# Patient Record
Sex: Female | Born: 1990 | Hispanic: No | Marital: Married | Smoking: Never smoker
Health system: Southern US, Community
[De-identification: ages and names within clinical notes are randomized; demographics above are authoritative.]

## PROBLEM LIST (undated history)

## (undated) DIAGNOSIS — Z789 Other specified health status: Secondary | ICD-10-CM

## (undated) DIAGNOSIS — O139 Gestational [pregnancy-induced] hypertension without significant proteinuria, unspecified trimester: Secondary | ICD-10-CM

## (undated) DIAGNOSIS — O09299 Supervision of pregnancy with other poor reproductive or obstetric history, unspecified trimester: Secondary | ICD-10-CM

## (undated) DIAGNOSIS — O1490 Unspecified pre-eclampsia, unspecified trimester: Secondary | ICD-10-CM

## (undated) HISTORY — PX: APPENDECTOMY: SHX54

## (undated) HISTORY — DX: Unspecified pre-eclampsia, unspecified trimester: O14.90

## (undated) HISTORY — PX: OTHER SURGICAL HISTORY: SHX169

---
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
## 2016-06-07 DIAGNOSIS — O139 Gestational [pregnancy-induced] hypertension without significant proteinuria, unspecified trimester: Secondary | ICD-10-CM

## 2016-11-05 NOTE — L&D Delivery Note (Signed)
Patient is 26 y.o. [id number] [redacted]w[redacted]d admitted for SROM. Labor augmented with cytotec and pitocin.    Delivery Note At 3:58 AM a viable female was delivered via Vaginal, Spontaneous Delivery (Presentation: ROA).  APGAR: 9, 9; weight pending.   Placenta status: spontaneous, intact.  Cord: 3 vessel   Anesthesia:  Epidural  Episiotomy: None Lacerations: 1st degree Suture Repair: 3.0 vicryl Est. Blood Loss (mL):  100  Mom to postpartum.  Baby to Couplet care / Skin to Skin.    Upon arrival patient was complete and pushing. She pushed with good maternal effort to deliver a viable female infant in cephalic, ROA position. Loose nuchal cord x1,easily reduced. Baby delivered without difficulty ( anterior shoulder delivered with ease), was noted to have good tone and placed on maternal abdomen for oral suctioning, drying and stimulation. Delayed cord clamping performed. Placenta delivered spontaneously with gentle cord traction. Fundus firm with massage and Pitocin. Perineum inspected and found to have first degree vaginal laceration, which was repaired with 3-0 vicryl with good hemostasis achieved. Counts of sharps, instruments, and lap pads were all correct.  Larene Beach, DO PGY-2 Family Medicine Resident 07/20/2017, 4:25 AM  Patient is a [id number] at [redacted]w[redacted]d who was admitted with SROM and induced with cytotec and Pitocin, significant hx of previous neonatal demise.    I was gloved and present for delivery in its entirety.  Second stage of labor progressed to SVD.  No decels during second stage noted.  Complications: none  Lacerations: 1st degree perineal/repair of infibulation  EBL: 100cc  [name], CNM 4:40 AM 07/20/2017

## 2017-02-05 ENCOUNTER — Other Ambulatory Visit (HOSPITAL_COMMUNITY)
Admission: RE | Admit: 2017-02-05 | Discharge: 2017-02-05 | Disposition: A | Discharge status: Home / Self Care | Payer: Medicaid Other | Source: Ambulatory Visit | Attending: Certified Nurse Midwife | Admitting: Certified Nurse Midwife

## 2017-02-05 ENCOUNTER — Encounter: Payer: Self-pay | Admitting: Certified Nurse Midwife

## 2017-02-05 ENCOUNTER — Ambulatory Visit (INDEPENDENT_AMBULATORY_CARE_PROVIDER_SITE_OTHER): Payer: Medicaid Other | Admitting: Certified Nurse Midwife

## 2017-02-05 VITALS — BP 118/82 | HR 106 | Wt 159.0 lb

## 2017-02-05 DIAGNOSIS — Z3402 Encounter for supervision of normal first pregnancy, second trimester: Secondary | ICD-10-CM | POA: Insufficient documentation

## 2017-02-05 DIAGNOSIS — O09291 Supervision of pregnancy with other poor reproductive or obstetric history, first trimester: Secondary | ICD-10-CM

## 2017-02-05 DIAGNOSIS — N90810 Female genital mutilation status, unspecified: Secondary | ICD-10-CM | POA: Diagnosis not present

## 2017-02-05 DIAGNOSIS — L900 Lichen sclerosus et atrophicus: Secondary | ICD-10-CM | POA: Diagnosis not present

## 2017-02-05 DIAGNOSIS — Z789 Other specified health status: Secondary | ICD-10-CM

## 2017-02-05 DIAGNOSIS — O0990 Supervision of high risk pregnancy, unspecified, unspecified trimester: Secondary | ICD-10-CM | POA: Insufficient documentation

## 2017-02-05 DIAGNOSIS — Z3400 Encounter for supervision of normal first pregnancy, unspecified trimester: Secondary | ICD-10-CM

## 2017-02-05 DIAGNOSIS — O09299 Supervision of pregnancy with other poor reproductive or obstetric history, unspecified trimester: Secondary | ICD-10-CM

## 2017-02-05 MED ORDER — FOLIC ACID 1 MG PO TABS: 1.0000 mg | ORAL_TABLET | Freq: Every day | ORAL | 5 refills | Status: DC

## 2017-02-05 MED ORDER — PROVIDA DHA 16-16-1.25-110 MG PO CAPS: 1.0000 | ORAL_CAPSULE | Freq: Every day | ORAL | 12 refills | Status: DC

## 2017-02-05 MED ORDER — CLOBETASOL PROPIONATE 0.05 % EX OINT: 1.0000 "application " | TOPICAL_OINTMENT | Freq: Two times a day (BID) | CUTANEOUS | 0 refills | Status: DC

## 2017-02-05 NOTE — Progress Notes (Signed)
Patient has some discharge- redness, painful, itching.

## 2017-02-06 ENCOUNTER — Other Ambulatory Visit: Payer: Self-pay | Admitting: Certified Nurse Midwife

## 2017-02-06 ENCOUNTER — Encounter: Payer: Self-pay | Admitting: Certified Nurse Midwife

## 2017-02-06 DIAGNOSIS — O09299 Supervision of pregnancy with other poor reproductive or obstetric history, unspecified trimester: Secondary | ICD-10-CM | POA: Insufficient documentation

## 2017-02-06 DIAGNOSIS — L900 Lichen sclerosus et atrophicus: Secondary | ICD-10-CM

## 2017-02-06 DIAGNOSIS — Z3480 Encounter for supervision of other normal pregnancy, unspecified trimester: Secondary | ICD-10-CM

## 2017-02-06 DIAGNOSIS — N90810 Female genital mutilation status, unspecified: Secondary | ICD-10-CM | POA: Insufficient documentation

## 2017-02-06 DIAGNOSIS — Z789 Other specified health status: Secondary | ICD-10-CM | POA: Insufficient documentation

## 2017-02-06 HISTORY — DX: Supervision of pregnancy with other poor reproductive or obstetric history, unspecified trimester: O09.299

## 2017-02-06 HISTORY — DX: Lichen sclerosus et atrophicus: L90.0

## 2017-02-06 LAB — OBSTETRIC PANEL, INCLUDING HIV
Antibody Screen: NEGATIVE
BASOS ABS: 0 10*3/uL (ref 0.0–0.2)
Basos: 0 %
EOS (ABSOLUTE): 0.2 10*3/uL (ref 0.0–0.4)
Eos: 2 %
HIV SCREEN 4TH GENERATION: NONREACTIVE
Hematocrit: 35.6 % (ref 34.0–46.6)
Hemoglobin: 12.5 g/dL (ref 11.1–15.9)
Hepatitis B Surface Ag: NEGATIVE
Immature Grans (Abs): 0 10*3/uL (ref 0.0–0.1)
Immature Granulocytes: 0 %
Lymphocytes Absolute: 1.9 10*3/uL (ref 0.7–3.1)
Lymphs: 19 %
MCH: 30 pg (ref 26.6–33.0)
MCHC: 35.1 g/dL (ref 31.5–35.7)
MCV: 86 fL (ref 79–97)
MONOS ABS: 0.6 10*3/uL (ref 0.1–0.9)
Monocytes: 5 %
NEUTROS ABS: 7.8 10*3/uL — AB (ref 1.4–7.0)
Neutrophils: 74 %
PLATELETS: 290 10*3/uL (ref 150–379)
RBC: 4.16 x10E6/uL (ref 3.77–5.28)
RDW: 13.5 % (ref 12.3–15.4)
RH TYPE: POSITIVE
RPR Ser Ql: NONREACTIVE
Rubella Antibodies, IGG: 16.3 index (ref >0.99)
WBC: 10.5 10*3/uL (ref 3.4–10.8)

## 2017-02-06 LAB — HEMOGLOBINOPATHY EVALUATION
HEMOGLOBIN A2 QUANTITATION: 3 % (ref 1.8–3.2)
HGB C: 0 %
HGB S: 0 %
HGB VARIANT: 0 %
Hemoglobin F Quantitation: 0 % (ref 0.0–2.0)
Hgb A: 97 % (ref 96.4–98.8)

## 2017-02-06 LAB — VARICELLA ZOSTER ANTIBODY, IGG: Varicella zoster IgG: <135 index — ABNORMAL LOW (ref >165)

## 2017-02-06 LAB — HEMOGLOBIN A1C
Est. average glucose Bld gHb Est-mCnc: 97 mg/dL
Hgb A1c MFr Bld: 5 % (ref 4.8–5.6)

## 2017-02-06 LAB — CERVICOVAGINAL ANCILLARY ONLY
BACTERIAL VAGINITIS: NEGATIVE
Candida vaginitis: POSITIVE — AB
Chlamydia: NEGATIVE
NEISSERIA GONORRHEA: NEGATIVE
Trichomonas: NEGATIVE

## 2017-02-06 LAB — TSH: TSH: 0.053 u[IU]/mL — AB (ref 0.450–4.500)

## 2017-02-06 MED ORDER — PROVIDA OB 20-20-1.25 MG PO CAPS: 1.0000 | ORAL_CAPSULE | Freq: Every day | ORAL | 5 refills | Status: DC

## 2017-02-06 NOTE — Progress Notes (Signed)
Subjective:    Jacqueline Irwin is being seen today for her first obstetrical visit.  This is a planned pregnancy. She is at [redacted]w[redacted]d gestation. Her obstetrical history is significant for pregnancy induced hypertension and delivered in [place], hx of miscarriage at 8 weeks in [place].  First child deilivered around 8 months and died after delivery.  . Relationship with FOB: spouse, living together. Patient does intend to breast feed. Pregnancy history fully reviewed.  The information documented in the HPI was reviewed and verified.  Menstrual History: OB History    Gravida Para Term Preterm AB Living   SAB TAB Ectopic Multiple Live Births   1               Patient's last menstrual period was 10/24/2016.    History reviewed. No pertinent past medical history.  Past Surgical History:  Procedure Laterality Date  . abcess of vulva    . APPENDECTOMY       (Not in a hospital admission) No Known Allergies  Social History  Substance Use Topics  . Smoking status: Never Smoker  . Smokeless tobacco: Never Used  . Alcohol use No    Family History  Problem Relation Age of Onset  . Diabetes Mother   . Diabetes Paternal Grandmother   . Hypertension Paternal Grandmother      Review of Systems Constitutional: negative for weight loss Gastrointestinal: negative for vomiting Genitourinary:negative for genital lesions and vaginal discharge and dysuria Musculoskeletal:negative for back pain Behavioral/Psych: negative for abusive relationship, depression, illegal drug usage and tobacco use    Objective:    BP 118/82   Pulse (!) 106   Wt 159 lb (72.1 kg)   LMP 10/24/2016  General Appearance:    Alert, cooperative, no distress, appears stated age  Head:    Normocephalic, without obvious abnormality, atraumatic  Eyes:    PERRL, conjunctiva/corneas clear, EOM's intact, fundi    benign, both eyes  Ears:    Normal TM's and external ear canals, both ears  Nose:   Nares normal,  septum midline, mucosa normal, no drainage    or sinus tenderness  Throat:   Lips, mucosa, and tongue normal; teeth and gums normal  Neck:   Supple, symmetrical, trachea midline, no adenopathy;    thyroid:  no enlargement/tenderness/nodules; no carotid   bruit or JVD  Back:     Symmetric, no curvature, ROM normal, no CVA tenderness  Lungs:     Clear to auscultation bilaterally, respirations unlabored  Chest Wall:    No tenderness or deformity   Heart:    Regular rate and rhythm, S1 and S2 normal, no murmur, rub   or gallop  Breast Exam:    No tenderness, masses, or nipple abnormality  Abdomen:     Soft, non-tender, bowel sounds active all four quadrants,    no masses, no organomegaly  Genitalia:    Normal female without lesion, discharge or tenderness  Extremities:   Extremities normal, atraumatic, no cyanosis or edema  Pulses:   2+ and symmetric all extremities  Skin:   Skin color, texture, turgor normal, no rashes or lesions  Lymph nodes:   Cervical, supraclavicular, and axillary nodes normal  Neurologic:   CNII-XII intact, normal strength, sensation and reflexes    throughout         Cervix:  Friable on exam, long, thick, closed and posterior.  FHR: 158 by doppler.     Size  c/w dates.      Lab Review Urine pregnancy test Labs reviewed yes Radiologic studies reviewed no Assessment:    Pregnancy at [redacted]w[redacted]d weeks     Supervision of normal first pregnancy, antepartum - Plan: Cytology - PAP, Cervicovaginal ancillary only, TSH, Hemoglobinopathy evaluation, Varicella zoster antibody, IgG, Culture, OB Urine, MaterniT21 PLUS Core+SCA, Hemoglobin A1c, Obstetric Panel, Including HIV, Cystic Fibrosis Mutation 97, folic acid (FOLVITE) 1 MG tablet, Prenat-FeFum-FePo-FA-DHA w/o A (PROVIDA DHA) 16-16-1.25-110 MG CAPS, [place] MFM OB COMP + 14 WK  Lichen sclerosus - Plan: clobetasol ointment (TEMOVATE) 0.05 %  Non-English speaking patient  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  Counseling provided regarding continued use of seat belts, cessation of alcohol consumption, smoking or use of illicit drugs; infection precautions i.e., influenza/TDAP immunizations, toxoplasmosis,CMV, parvovirus, listeria and varicella; workplace safety, exercise during pregnancy; routine dental care, safe medications, sexual activity, hot tubs, saunas, pools, travel, caffeine use, fish and methlymercury, potential toxins, hair treatments, varicose veins Weight gain recommendations per IOM guidelines reviewed: underweight/BMI< 18.5--> gain 28 - 40 lbs; normal weight/BMI 18.5 - 24.9--> gain 25 - 35 lbs; overweight/BMI 25 - 29.9--> gain 15 - 25 lbs; obese/BMI >30->gain  11 - 20 lbs Problem list reviewed and updated. FIRST/CF mutation testing/NIPT/QUAD SCREEN/fragile X/Ashkenazi Jewish population testing/Spinal muscular atrophy discussed: ordered. Role of ultrasound in pregnancy discussed; fetal survey: ordered. Amniocentesis discussed: not indicated. VBAC calculator score: VBAC consent form provided Meds ordered this encounter  Medications  . aspirin 75 MG chewable tablet    Sig: Chew 75 mg by mouth daily.  Marland Kitchen DISCONTD: folic acid (FOLVITE) 1 MG tablet    Sig: Take 1 mg by mouth daily.  . folic acid (FOLVITE) 1 MG tablet    Sig: Take 1 tablet (1 mg total) by mouth daily.    Dispense:  30 tablet    Refill:  5  . Prenat-FeFum-FePo-FA-DHA w/o A (PROVIDA DHA) 16-16-1.25-110 MG CAPS    Sig: Take 1 tablet by mouth daily.    Dispense:  30 capsule    Refill:  12  . clobetasol ointment (TEMOVATE) 0.05 %    Sig: Apply 1 application topically 2 (two) times daily.    Dispense:  30 g    Refill:  0   Orders Placed This Encounter  Procedures  . Culture, OB Urine  . [place] MFM OB COMP + 14 WK    Standing Status:   Future    Standing Expiration Date:   04/07/2018    Order Specific Question:   Reason for Exam (SYMPTOM  OR DIAGNOSIS  REQUIRED)    Answer:   fetal anatomy scan    Order Specific Question:   Preferred imaging location?    Answer:   [facility]  . TSH  . Hemoglobinopathy evaluation  . Varicella zoster antibody, IgG  . MaterniT21 PLUS Core+SCA    Order Specific Question:   Is the patient insulin dependent?    Answer:   No    Order Specific Question:   Please enter gestational age. This should be expressed as weeks AND days, i.e. 16w 6d. Enter weeks here. Enter days in next question.    Answer:   66    Order Specific Question:   Please enter gestational age. This should be expressed as weeks AND days, i.e. 16w 6d. Enter days here. Enter weeks in previous question.    Answer:   6  Order Specific Question:   How was gestational age calculated?    Answer:   LMP    Order Specific Question:   Please give the date of LMP OR Ultrasound OR Estimated date of delivery.    Answer:   07/31/2017    Order Specific Question:   Number of Fetuses (Type of Pregnancy):    Answer:   1    Order Specific Question:   Indications for performing the test? (please choose all that apply):    Answer:   Routine screening    Order Specific Question:   Other Indications? (Y=Yes, N=No)    Answer:   N    Order Specific Question:   If this is a repeat specimen, please indicate the reason:    Answer:   Not indicated    Order Specific Question:   Please specify the patient's race: (C=White/Caucasion, B=Black, I=Native American, A=Asian, H=Hispanic, O=Other, U=Unknown)    Answer:   O    Order Specific Question:   Donor Egg - indicate if the egg was obtained from in vitro fertilization.    Answer:   N    Order Specific Question:   Age of Egg Donor.    Answer:   74    Order Specific Question:   Prior Down Syndrome/ONTD screening during current pregnancy.    Answer:   N    Order Specific Question:   Prior First Trimester Testing    Answer:   N    Order Specific Question:   Prior Second Trimester Testing    Answer:   N    Order  Specific Question:   Family History of Neural Tube Defects    Answer:   N    Order Specific Question:   Prior Pregnancy with Down Syndrome    Answer:   N    Order Specific Question:   Please give the patient's weight (in pounds)    Answer:   159  . Hemoglobin A1c  . Obstetric Panel, Including HIV  . Cystic Fibrosis Mutation 97    Follow up in 4 weeks. 50% of 30 min visit spent on counseling and coordination of care.

## 2017-02-07 ENCOUNTER — Encounter: Payer: Self-pay | Admitting: *Deleted

## 2017-02-07 ENCOUNTER — Other Ambulatory Visit: Payer: Self-pay | Admitting: Certified Nurse Midwife

## 2017-02-07 DIAGNOSIS — O09899 Supervision of other high risk pregnancies, unspecified trimester: Secondary | ICD-10-CM | POA: Insufficient documentation

## 2017-02-07 DIAGNOSIS — Z283 Underimmunization status: Secondary | ICD-10-CM

## 2017-02-07 DIAGNOSIS — B3731 Acute candidiasis of vulva and vagina: Secondary | ICD-10-CM

## 2017-02-07 DIAGNOSIS — Z2839 Other underimmunization status: Secondary | ICD-10-CM | POA: Insufficient documentation

## 2017-02-07 DIAGNOSIS — B373 Candidiasis of vulva and vagina: Secondary | ICD-10-CM

## 2017-02-07 LAB — CYTOLOGY - PAP: Diagnosis: NEGATIVE

## 2017-02-07 LAB — CULTURE, OB URINE

## 2017-02-07 LAB — URINE CULTURE, OB REFLEX

## 2017-02-07 MED ORDER — FLUCONAZOLE 150 MG PO TABS: 150.0000 mg | ORAL_TABLET | Freq: Once | ORAL | 0 refills | Status: AC

## 2017-02-11 ENCOUNTER — Other Ambulatory Visit: Payer: Self-pay | Admitting: Certified Nurse Midwife

## 2017-02-11 DIAGNOSIS — Z3480 Encounter for supervision of other normal pregnancy, unspecified trimester: Secondary | ICD-10-CM

## 2017-02-11 LAB — MATERNIT21 PLUS CORE+SCA
Chromosome 13: NEGATIVE
Chromosome 18: NEGATIVE
Chromosome 21: NEGATIVE
Y Chromosome: DETECTED

## 2017-02-11 LAB — CYSTIC FIBROSIS MUTATION 97: GENE DIS ANAL CARRIER INTERP BLD/T-IMP: NOT DETECTED

## 2017-02-12 ENCOUNTER — Other Ambulatory Visit: Payer: Self-pay | Admitting: Certified Nurse Midwife

## 2017-02-12 DIAGNOSIS — Z3480 Encounter for supervision of other normal pregnancy, unspecified trimester: Secondary | ICD-10-CM

## 2017-02-12 DIAGNOSIS — R7989 Other specified abnormal findings of blood chemistry: Secondary | ICD-10-CM | POA: Insufficient documentation

## 2017-02-13 LAB — THYROID PANEL
Free Thyroxine Index: 2.9 (ref 1.2–4.9)
T3 UPTAKE RATIO: 19 % — AB (ref 24–39)
T4, Total: 15.5 ug/dL — ABNORMAL HIGH (ref 4.5–12.0)

## 2017-02-13 LAB — SPECIMEN STATUS REPORT

## 2017-02-18 ENCOUNTER — Encounter: Payer: Self-pay | Admitting: *Deleted

## 2017-03-05 ENCOUNTER — Encounter: Payer: Medicaid Other | Admitting: Certified Nurse Midwife

## 2017-03-05 ENCOUNTER — Ambulatory Visit (INDEPENDENT_AMBULATORY_CARE_PROVIDER_SITE_OTHER): Payer: Medicaid Other | Admitting: Certified Nurse Midwife

## 2017-03-05 ENCOUNTER — Encounter: Payer: Self-pay | Admitting: Certified Nurse Midwife

## 2017-03-05 ENCOUNTER — Other Ambulatory Visit (HOSPITAL_COMMUNITY)
Admission: RE | Admit: 2017-03-05 | Discharge: 2017-03-05 | Disposition: A | Discharge status: Home / Self Care | Payer: Medicaid Other | Source: Ambulatory Visit | Attending: Certified Nurse Midwife | Admitting: Certified Nurse Midwife

## 2017-03-05 VITALS — BP 107/76 | HR 101 | Wt 158.0 lb

## 2017-03-05 DIAGNOSIS — L900 Lichen sclerosus et atrophicus: Secondary | ICD-10-CM

## 2017-03-05 DIAGNOSIS — B373 Candidiasis of vulva and vagina: Secondary | ICD-10-CM

## 2017-03-05 DIAGNOSIS — O09299 Supervision of pregnancy with other poor reproductive or obstetric history, unspecified trimester: Secondary | ICD-10-CM

## 2017-03-05 DIAGNOSIS — Z789 Other specified health status: Secondary | ICD-10-CM

## 2017-03-05 DIAGNOSIS — O09892 Supervision of other high risk pregnancies, second trimester: Secondary | ICD-10-CM

## 2017-03-05 DIAGNOSIS — R7989 Other specified abnormal findings of blood chemistry: Secondary | ICD-10-CM

## 2017-03-05 DIAGNOSIS — O09292 Supervision of pregnancy with other poor reproductive or obstetric history, second trimester: Secondary | ICD-10-CM

## 2017-03-05 DIAGNOSIS — Z3480 Encounter for supervision of other normal pregnancy, unspecified trimester: Secondary | ICD-10-CM

## 2017-03-05 DIAGNOSIS — R946 Abnormal results of thyroid function studies: Secondary | ICD-10-CM

## 2017-03-05 DIAGNOSIS — B3731 Acute candidiasis of vulva and vagina: Secondary | ICD-10-CM

## 2017-03-05 DIAGNOSIS — Z2839 Other underimmunization status: Secondary | ICD-10-CM

## 2017-03-05 DIAGNOSIS — B9689 Other specified bacterial agents as the cause of diseases classified elsewhere: Secondary | ICD-10-CM | POA: Diagnosis not present

## 2017-03-05 DIAGNOSIS — O09899 Supervision of other high risk pregnancies, unspecified trimester: Secondary | ICD-10-CM

## 2017-03-05 DIAGNOSIS — N90810 Female genital mutilation status, unspecified: Secondary | ICD-10-CM

## 2017-03-05 DIAGNOSIS — Z283 Underimmunization status: Secondary | ICD-10-CM

## 2017-03-05 MED ORDER — ASPIRIN 81 MG PO CHEW: 81.0000 mg | CHEWABLE_TABLET | Freq: Every day | ORAL | 12 refills | Status: DC

## 2017-03-05 MED ORDER — TERCONAZOLE 0.8 % VA CREA: 1.0000 | TOPICAL_CREAM | Freq: Every day | VAGINAL | 0 refills | Status: DC

## 2017-03-05 MED ORDER — FLUCONAZOLE 150 MG PO TABS: 150.0000 mg | ORAL_TABLET | Freq: Once | ORAL | 0 refills | Status: AC

## 2017-03-05 NOTE — Progress Notes (Signed)
Phone Int [phone] - Arabic Pt states that she is having some cramping.  Pt states that she did not get Rx for yeast infection, ?resend today. Pt states that she would like to know if she may fast for Ramadan.

## 2017-03-06 LAB — CERVICOVAGINAL ANCILLARY ONLY
BACTERIAL VAGINITIS: NEGATIVE
Candida vaginitis: POSITIVE — AB

## 2017-03-07 ENCOUNTER — Ambulatory Visit (HOSPITAL_COMMUNITY)
Admission: RE | Admit: 2017-03-07 | Discharge: 2017-03-07 | Disposition: A | Discharge status: Home / Self Care | Payer: Medicaid Other | Source: Ambulatory Visit | Attending: Certified Nurse Midwife | Admitting: Certified Nurse Midwife

## 2017-03-07 ENCOUNTER — Other Ambulatory Visit: Payer: Self-pay | Admitting: Certified Nurse Midwife

## 2017-03-07 DIAGNOSIS — Z363 Encounter for antenatal screening for malformations: Secondary | ICD-10-CM | POA: Insufficient documentation

## 2017-03-07 DIAGNOSIS — Z3A19 19 weeks gestation of pregnancy: Secondary | ICD-10-CM | POA: Diagnosis not present

## 2017-03-07 DIAGNOSIS — Z3480 Encounter for supervision of other normal pregnancy, unspecified trimester: Secondary | ICD-10-CM

## 2017-03-07 DIAGNOSIS — Z8759 Personal history of other complications of pregnancy, childbirth and the puerperium: Secondary | ICD-10-CM | POA: Insufficient documentation

## 2017-03-07 DIAGNOSIS — Z3400 Encounter for supervision of normal first pregnancy, unspecified trimester: Secondary | ICD-10-CM

## 2017-04-02 ENCOUNTER — Encounter: Payer: Self-pay | Admitting: Certified Nurse Midwife

## 2017-04-02 ENCOUNTER — Ambulatory Visit (INDEPENDENT_AMBULATORY_CARE_PROVIDER_SITE_OTHER): Payer: Medicaid Other | Admitting: Certified Nurse Midwife

## 2017-04-02 VITALS — BP 110/76 | HR 100 | Ht 64.0 in

## 2017-04-02 DIAGNOSIS — Z3480 Encounter for supervision of other normal pregnancy, unspecified trimester: Secondary | ICD-10-CM

## 2017-04-02 DIAGNOSIS — O09899 Supervision of other high risk pregnancies, unspecified trimester: Secondary | ICD-10-CM

## 2017-04-02 DIAGNOSIS — R946 Abnormal results of thyroid function studies: Secondary | ICD-10-CM | POA: Diagnosis not present

## 2017-04-02 DIAGNOSIS — O09292 Supervision of pregnancy with other poor reproductive or obstetric history, second trimester: Secondary | ICD-10-CM | POA: Diagnosis not present

## 2017-04-02 DIAGNOSIS — R7989 Other specified abnormal findings of blood chemistry: Secondary | ICD-10-CM

## 2017-04-02 DIAGNOSIS — Z789 Other specified health status: Secondary | ICD-10-CM

## 2017-04-02 DIAGNOSIS — Z283 Underimmunization status: Secondary | ICD-10-CM

## 2017-04-02 DIAGNOSIS — Z2839 Other underimmunization status: Secondary | ICD-10-CM

## 2017-04-02 DIAGNOSIS — O09299 Supervision of pregnancy with other poor reproductive or obstetric history, unspecified trimester: Secondary | ICD-10-CM

## 2017-04-02 NOTE — Progress Notes (Signed)
Interpreter # [id number] Pt would like to discuss baby movements. What to expect at this gestation.

## 2017-04-02 NOTE — Progress Notes (Signed)
   PRENATAL VISIT NOTE  Subjective:  Jacqueline Irwin is a 26 y.o. G3P0110 at 6280w6d being seen today for ongoing prenatal care.  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  Patient reports no bleeding, no contractions, no cramping, no leaking and uri symptoms. URI symptoms, denies fever, reports nasal congestion, cough, no sputum production   Contractions: Not present. Vag. Bleeding: None.  Movement: Present. Denies leaking of fluid.   The following portions of the patient's history were reviewed and updated as appropriate: allergies, current medications, past family history, past medical history, past social history, past surgical history and problem list. Problem list updated.  Objective:   Vitals:   04/02/17 1028 04/02/17 1033  BP: 110/76   Pulse: 100   Height:  5\' 4"  (1.626 m)    Fetal Status: Fetal Heart Rate (bpm): 145 Fundal Height: 23 cm Movement: Present     General:  Alert, oriented and cooperative. Patient is in no acute distress.  Skin: Skin is warm and dry. No rash noted.   Cardiovascular: Normal heart rate noted  Respiratory: Normal respiratory effort, no problems with respiration noted  Abdomen: Soft, gravid, appropriate for gestational age. Pain/Pressure: Absent     Pelvic:  Cervical exam deferred        Extremities: Normal range of motion.     Mental Status: Normal mood and affect. Normal behavior. Normal judgment and thought content.   Assessment and Plan:  Pregnancy: G3P0110 at 2180w6d  1. Supervision of other normal pregnancy, antepartum        2. Non-English speaking patient     Video interpreter used  3. Hx of preeclampsia,  prior pregnancy, currently pregnant     Taking baby ASA daily  4. Maternal varicella, non-immune     Varicella postpartum  5. Abnormal thyroid stimulating hormone (TSH) level     Has endocrinology appointment pending  Preterm labor symptoms and general obstetric precautions including but not limited to vaginal bleeding, contractions, leaking of fluid and fetal movement were reviewed in detail with the patient. Please refer to After Visit Summary for other counseling recommendations.  Return in about 4 weeks (around 04/30/2017) for ROB, 2 hr OGTT.   Roe Coombsachelle A Hollee Fate, CNM

## 2017-04-09 ENCOUNTER — Other Ambulatory Visit: Payer: Self-pay | Admitting: Certified Nurse Midwife

## 2017-04-09 MED ORDER — NESTABS ONE 38-1-225 MG PO CAPS: 1.0000 | ORAL_CAPSULE | Freq: Every day | ORAL | 12 refills | Status: DC

## 2017-04-15 ENCOUNTER — Ambulatory Visit (HOSPITAL_COMMUNITY): Payer: Medicaid Other

## 2017-04-22 ENCOUNTER — Ambulatory Visit (HOSPITAL_COMMUNITY): Payer: Medicaid Other

## 2017-04-30 ENCOUNTER — Encounter: Payer: Self-pay | Admitting: Certified Nurse Midwife

## 2017-04-30 ENCOUNTER — Ambulatory Visit (INDEPENDENT_AMBULATORY_CARE_PROVIDER_SITE_OTHER): Payer: Medicaid Other | Admitting: Certified Nurse Midwife

## 2017-04-30 VITALS — BP 105/72 | HR 105 | Wt 161.8 lb

## 2017-04-30 DIAGNOSIS — Z2839 Other underimmunization status: Secondary | ICD-10-CM

## 2017-04-30 DIAGNOSIS — O09299 Supervision of pregnancy with other poor reproductive or obstetric history, unspecified trimester: Secondary | ICD-10-CM

## 2017-04-30 DIAGNOSIS — R946 Abnormal results of thyroid function studies: Secondary | ICD-10-CM | POA: Diagnosis not present

## 2017-04-30 DIAGNOSIS — O09899 Supervision of other high risk pregnancies, unspecified trimester: Secondary | ICD-10-CM

## 2017-04-30 DIAGNOSIS — Z3480 Encounter for supervision of other normal pregnancy, unspecified trimester: Secondary | ICD-10-CM

## 2017-04-30 DIAGNOSIS — Z283 Underimmunization status: Secondary | ICD-10-CM | POA: Diagnosis not present

## 2017-04-30 DIAGNOSIS — O09292 Supervision of pregnancy with other poor reproductive or obstetric history, second trimester: Secondary | ICD-10-CM

## 2017-04-30 DIAGNOSIS — O09892 Supervision of other high risk pregnancies, second trimester: Secondary | ICD-10-CM | POA: Diagnosis not present

## 2017-04-30 DIAGNOSIS — Z789 Other specified health status: Secondary | ICD-10-CM

## 2017-04-30 DIAGNOSIS — N90810 Female genital mutilation status, unspecified: Secondary | ICD-10-CM | POA: Diagnosis not present

## 2017-04-30 DIAGNOSIS — R7989 Other specified abnormal findings of blood chemistry: Secondary | ICD-10-CM

## 2017-04-30 NOTE — Progress Notes (Signed)
   PRENATAL VISIT NOTE  Subjective:  Jacqueline Irwin is a 26 y.o. G3P0110 at 41w6dbeing seen today for ongoing prenatal care.  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  Patient reports no complaints.  Contractions: Irritability. Vag. Bleeding: None.  Movement: Present. Denies leaking of fluid.   The following portions of the patient's history were reviewed and updated as appropriate: allergies, current medications, past family history, past medical history, past social history, past surgical history and problem list. Problem list updated.  Objective:   Vitals:   04/30/17 0921  BP: 105/72  Pulse: (!) 105  Weight: 161 lb 12.8 oz (73.4 kg)    Fetal Status: Fetal Heart Rate (bpm): 148 Fundal Height: 26 cm Movement: Present     General:  Alert, oriented and cooperative. Patient is in no acute distress.  Skin: Skin is warm and dry. No rash noted.   Cardiovascular: Normal heart rate noted  Respiratory: Normal respiratory effort, no problems with respiration noted  Abdomen: Soft, gravid, appropriate for gestational age. Pain/Pressure: Absent     Pelvic:  Cervical exam deferred        Extremities: Normal range of motion.  Edema: None  Mental Status: Normal mood and affect. Normal behavior. Normal judgment and thought content.   Assessment and Plan:  Pregnancy: G3P0110 at 261w6d1. Supervision of other normal pregnancy, antepartum      Pregnancy home coordinator met with patient.  Increased water encouraged along with increased snacks.  - Glucose Tolerance, 2 Hours w/1 Hour - CBC - HIV antibody - RPR  2. Non-English speaking patient     Video  interpreter used.   3. Hx of preeclampsia, prior pregnancy, currently pregnant     Taking baby ASA  4. Maternal varicella, non-immune     Varicella postpartum  5. Abnormal thyroid stimulating hormone (TSH) level     Has f/u scheduled with endocrinology 05/24/17.    6. 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   Preterm labor symptoms and general obstetric precautions including but not limited to vaginal bleeding, contractions, leaking of fluid and fetal movement were reviewed in detail with the patient. Please refer to After Visit Summary for other counseling recommendations.  Return in about 2 weeks (around 05/14/2017) for RODeaver  Jacqueline CrockerCNM

## 2017-05-01 ENCOUNTER — Other Ambulatory Visit: Payer: Self-pay | Admitting: Certified Nurse Midwife

## 2017-05-01 DIAGNOSIS — Z3480 Encounter for supervision of other normal pregnancy, unspecified trimester: Secondary | ICD-10-CM

## 2017-05-01 LAB — CBC
HEMATOCRIT: 34.8 % (ref 34.0–46.6)
HEMOGLOBIN: 12 g/dL (ref 11.1–15.9)
MCH: 29.5 pg (ref 26.6–33.0)
MCHC: 34.5 g/dL (ref 31.5–35.7)
MCV: 86 fL (ref 79–97)
Platelets: 213 10*3/uL (ref 150–379)
RBC: 4.07 x10E6/uL (ref 3.77–5.28)
RDW: 13.8 % (ref 12.3–15.4)
WBC: 10.4 10*3/uL (ref 3.4–10.8)

## 2017-05-01 LAB — GLUCOSE TOLERANCE, 2 HOURS W/ 1HR
GLUCOSE, 1 HOUR: 119 mg/dL (ref 65–179)
GLUCOSE, FASTING: 74 mg/dL (ref 65–91)
Glucose, 2 hour: 86 mg/dL (ref 65–152)

## 2017-05-01 LAB — RPR: RPR: NONREACTIVE

## 2017-05-01 LAB — HIV ANTIBODY (ROUTINE TESTING W REFLEX): HIV SCREEN 4TH GENERATION: NONREACTIVE

## 2017-05-02 ENCOUNTER — Other Ambulatory Visit: Payer: Self-pay | Admitting: Certified Nurse Midwife

## 2017-05-02 ENCOUNTER — Ambulatory Visit (HOSPITAL_COMMUNITY)
Admission: RE | Admit: 2017-05-02 | Discharge: 2017-05-02 | Disposition: A | Discharge status: Home / Self Care | Payer: Medicaid Other | Source: Ambulatory Visit | Attending: Certified Nurse Midwife | Admitting: Certified Nurse Midwife

## 2017-05-02 DIAGNOSIS — Z3480 Encounter for supervision of other normal pregnancy, unspecified trimester: Secondary | ICD-10-CM

## 2017-05-02 DIAGNOSIS — O09299 Supervision of pregnancy with other poor reproductive or obstetric history, unspecified trimester: Secondary | ICD-10-CM

## 2017-05-02 DIAGNOSIS — IMO0002 Reserved for concepts with insufficient information to code with codable children: Secondary | ICD-10-CM

## 2017-05-02 DIAGNOSIS — Z0489 Encounter for examination and observation for other specified reasons: Secondary | ICD-10-CM

## 2017-05-02 DIAGNOSIS — O09292 Supervision of pregnancy with other poor reproductive or obstetric history, second trimester: Secondary | ICD-10-CM | POA: Diagnosis present

## 2017-05-02 DIAGNOSIS — Z048 Encounter for examination and observation for other specified reasons: Secondary | ICD-10-CM | POA: Insufficient documentation

## 2017-05-02 DIAGNOSIS — Z3A27 27 weeks gestation of pregnancy: Secondary | ICD-10-CM | POA: Insufficient documentation

## 2017-05-07 ENCOUNTER — Other Ambulatory Visit: Payer: Self-pay | Admitting: Certified Nurse Midwife

## 2017-05-07 DIAGNOSIS — Z3480 Encounter for supervision of other normal pregnancy, unspecified trimester: Secondary | ICD-10-CM

## 2017-05-15 ENCOUNTER — Encounter: Payer: Self-pay | Admitting: Certified Nurse Midwife

## 2017-05-15 ENCOUNTER — Ambulatory Visit (INDEPENDENT_AMBULATORY_CARE_PROVIDER_SITE_OTHER): Payer: Medicaid Other | Admitting: Certified Nurse Midwife

## 2017-05-15 VITALS — BP 103/72 | HR 108 | Wt 161.8 lb

## 2017-05-15 DIAGNOSIS — K219 Gastro-esophageal reflux disease without esophagitis: Secondary | ICD-10-CM

## 2017-05-15 DIAGNOSIS — O99613 Diseases of the digestive system complicating pregnancy, third trimester: Secondary | ICD-10-CM

## 2017-05-15 DIAGNOSIS — Z2839 Other underimmunization status: Secondary | ICD-10-CM

## 2017-05-15 DIAGNOSIS — O09293 Supervision of pregnancy with other poor reproductive or obstetric history, third trimester: Secondary | ICD-10-CM

## 2017-05-15 DIAGNOSIS — O09299 Supervision of pregnancy with other poor reproductive or obstetric history, unspecified trimester: Secondary | ICD-10-CM

## 2017-05-15 DIAGNOSIS — O09899 Supervision of other high risk pregnancies, unspecified trimester: Secondary | ICD-10-CM

## 2017-05-15 DIAGNOSIS — Z3483 Encounter for supervision of other normal pregnancy, third trimester: Secondary | ICD-10-CM

## 2017-05-15 DIAGNOSIS — Z789 Other specified health status: Secondary | ICD-10-CM

## 2017-05-15 DIAGNOSIS — Z283 Underimmunization status: Secondary | ICD-10-CM

## 2017-05-15 DIAGNOSIS — Z3480 Encounter for supervision of other normal pregnancy, unspecified trimester: Secondary | ICD-10-CM

## 2017-05-15 MED ORDER — OMEPRAZOLE 20 MG PO CPDR: 20.0000 mg | DELAYED_RELEASE_CAPSULE | Freq: Two times a day (BID) | ORAL | 5 refills | Status: DC

## 2017-05-15 NOTE — Progress Notes (Signed)
   PRENATAL VISIT NOTE  Subjective:  Jacqueline Irwin is a 26 y.o. G3P0110 at 47w0dbeing seen today for ongoing prenatal care.  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  Patient reports heartburn, no bleeding, no contractions, no cramping and no leaking.  Contractions: Not present. Vag. Bleeding: None.  Movement: Present. Denies leaking of fluid.   The following portions of the patient's history were reviewed and updated as appropriate: allergies, current medications, past family history, past medical history, past social history, past surgical history and problem list. Problem list updated.  Objective:   Vitals:   05/15/17 1004  BP: 103/72  Pulse: (!) 108  Weight: 161 lb 12.8 oz (73.4 kg)    Fetal Status: Fetal Heart Rate (bpm): 150 Fundal Height: 28 cm Movement: Present     General:  Alert, oriented and cooperative. Patient is in no acute distress.  Skin: Skin is warm and dry. No rash noted.   Cardiovascular: Normal heart rate noted  Respiratory: Normal respiratory effort, no problems with respiration noted  Abdomen: Soft, gravid, appropriate for gestational age. Pain/Pressure: Absent     Pelvic:  Cervical exam deferred        Extremities: Normal range of motion.  Edema: None  Mental Status: Normal mood and affect. Normal behavior. Normal judgment and thought content.   Assessment and Plan:  Pregnancy: G3P0110 at 275w0d1. Supervision of other normal pregnancy, antepartum     SW met with patient in office.   2. Non-English speaking patient Video interpreter used.   3. Hx of preeclampsia, prior pregnancy, currently pregnant     Taking baby  ASA  4. Maternal varicella, non-immune     Varicella postpartum  5. Gastroesophageal reflux during pregnancy, antepartum, third trimester      Taking OTC TUMS - omeprazole (PRILOSEC) 20 MG capsule; Take 1 capsule (20 mg total) by mouth 2 (two) times daily before a meal.  Dispense: 60 capsule; Refill: 5  Preterm labor symptoms and general obstetric precautions including but not limited to vaginal bleeding, contractions, leaking of fluid and fetal movement were reviewed in detail with the patient. Please refer to After Visit Summary for other counseling recommendations.  Return in about 2 weeks (around 05/29/2017) for ROB.   RaMorene CrockerCNM

## 2017-05-15 NOTE — Progress Notes (Signed)
Patient has acid reflux concern.

## 2017-05-15 NOTE — Patient Instructions (Signed)
AREA PEDIATRIC/FAMILY PRACTICE PHYSICIANS  [facility] [address], [address] [place], [place]  [address] Phone - [phone]   Fax - [phone]  [facility] [address] [place], [place] [address] Phone - [phone]   Fax - [phone]  [facility] [address] [place], [place]  [address] Phone - [phone]   Fax - [phone]  [facility] [address] [place], [place]  [address] Phone - [phone]   Fax - [phone]  [facility] [address] [place], [place]  [address] Phone - [phone]   Fax - [phone]  [facility] [address], [address] [place], [place]  [address] Phone - [phone]   Fax - [phone]  [facility] [address] [place], [place]  [address] Phone - [phone]   Fax - [phone]  [facility] [address], [address] [place], [place]  [address] Phone - [phone]   Fax - [phone]  [facility] [address] [place], [place]  [address] Phone - [phone]   Fax - [phone] [facility] [address] [place], [place]  [address] Phone - [phone]   Fax - [phone]  [facility] [address] [place], [place]  [address] Phone - [phone]   Fax - [phone]  [facility] [address] [place], [place]  [address] Phone - [phone]   Fax - [phone]  [facility] [address], [address] [place], [place]  [address] Phone - [phone]   Fax - [phone]  [facility] [address] [place], [place]  [address] Phone - [phone]  [facility] [address] [place], [place]  [address] Phone - [phone]   Fax - [phone]  [facility] [address] [place], [place]  [address] Phone - [phone]   Fax - [phone]  [facility] [address] [place], [place]  [address] Phone - [phone]   Fax - [phone]  [facility] [address] [place], [place]  [address] Phone - [phone]   Fax - [phone]   [facility] [address] [place], [place]  [address] Phone - [phone]   Fax - [phone]  [facility] [address] [place], [place]  [address] Phone - [phone]   Fax - [phone]  [facility] [address] [place], [place]  [address] Phone - [phone]   Fax - [phone]  [facility] [address] , [place]  [address] Phone - [phone]   Fax - [phone]  [facility] [address] , [place]  [address] Phone - [phone]   Fax - [phone] [facility] [address] [place], [place]  [address] Phone - [phone]   Fax - [phone]  [facility] [address] [place], [place]  [address] Phone - [phone]   Fax - [phone]  [facility] [address] [place], [place]  [address] Phone - [phone]   Fax - [phone]  [facility] Charlene Flemming MD [address] [place] [address] Phone [phone]  Fax [phone]   

## 2017-05-29 ENCOUNTER — Ambulatory Visit (INDEPENDENT_AMBULATORY_CARE_PROVIDER_SITE_OTHER): Payer: Medicaid Other | Admitting: Certified Nurse Midwife

## 2017-05-29 ENCOUNTER — Encounter: Payer: Self-pay | Admitting: Certified Nurse Midwife

## 2017-05-29 VITALS — BP 100/68 | HR 108 | Wt 164.0 lb

## 2017-05-29 DIAGNOSIS — O09893 Supervision of other high risk pregnancies, third trimester: Secondary | ICD-10-CM

## 2017-05-29 DIAGNOSIS — Z2839 Other underimmunization status: Secondary | ICD-10-CM

## 2017-05-29 DIAGNOSIS — Z3483 Encounter for supervision of other normal pregnancy, third trimester: Secondary | ICD-10-CM

## 2017-05-29 DIAGNOSIS — O09899 Supervision of other high risk pregnancies, unspecified trimester: Secondary | ICD-10-CM

## 2017-05-29 DIAGNOSIS — Z283 Underimmunization status: Secondary | ICD-10-CM

## 2017-05-29 DIAGNOSIS — O09293 Supervision of pregnancy with other poor reproductive or obstetric history, third trimester: Secondary | ICD-10-CM

## 2017-05-29 DIAGNOSIS — Z789 Other specified health status: Secondary | ICD-10-CM

## 2017-05-29 DIAGNOSIS — O09299 Supervision of pregnancy with other poor reproductive or obstetric history, unspecified trimester: Secondary | ICD-10-CM

## 2017-05-29 DIAGNOSIS — Z3480 Encounter for supervision of other normal pregnancy, unspecified trimester: Secondary | ICD-10-CM

## 2017-05-29 MED ORDER — PROVIDA DHA 16-16-1.25-110 MG PO CAPS: 1.0000 | ORAL_CAPSULE | Freq: Every day | ORAL | 12 refills | Status: DC

## 2017-05-29 NOTE — Progress Notes (Signed)
   PRENATAL VISIT NOTE  Subjective:  Jacqueline Irwin is a 26 y.o. G3P0110 at 214w0d being seen today for ongoing prenatal care.  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  Patient reports no complaints.  Contractions: Not present. Vag. Bleeding: None.  Movement: Present. Denies leaking of fluid.   The following portions of the patient's history were reviewed and updated as appropriate: allergies, current medications, past family history, past medical history, past social history, past surgical history and problem list. Problem list updated.  Objective:   Vitals:   05/29/17 0939  BP: 100/68  Pulse: (!) 108  Weight: 164 lb (74.4 kg)    Fetal Status: Fetal Heart Rate (bpm): 130 Fundal Height: 30 cm Movement: Present     General:  Alert, oriented and cooperative. Patient is in no acute distress.  Skin: Skin is warm and dry. No rash noted.   Cardiovascular: Normal heart rate noted  Respiratory: Normal respiratory effort, no problems with respiration noted  Abdomen: Soft, gravid, appropriate for gestational age.  Pain/Pressure: Present     Pelvic: Cervical exam deferred        Extremities: Normal range of motion.     Mental Status:  Normal mood and affect. Normal behavior. Normal judgment and thought content.   Assessment and Plan:  Pregnancy: G3P0110 at 5514w0d  1. Supervision of other normal pregnancy, antepartum    Doing well.  PNV changed d/t gelatin.  - Prenat-FeFum-FePo-FA-DHA w/o A (PROVIDA DHA) 16-16-1.25-110 MG CAPS; Take 1 tablet by mouth daily.  Dispense: 30 capsule; Refill: 12  2. Non-English speaking patient     Video interpreter used.    3. Maternal varicella, non-immune     Varicella postpartum  4. Hx of preeclampsia, prior pregnancy, currently pregnant     Taking baby asa.  B/P normotensive.   Preterm labor symptoms and general obstetric precautions including but not limited to vaginal bleeding, contractions, leaking of fluid and fetal movement were reviewed in detail with the patient. Please refer to After Visit Summary for other counseling recommendations.  Return in about 2 weeks (around 06/12/2017) for ROB.   Roe Coombsachelle A Akosua Constantine, CNM

## 2017-05-29 NOTE — Progress Notes (Signed)
Pt states that she had some ctx few days ago but resolved.

## 2017-06-12 ENCOUNTER — Ambulatory Visit (INDEPENDENT_AMBULATORY_CARE_PROVIDER_SITE_OTHER): Payer: Medicaid Other | Admitting: Certified Nurse Midwife

## 2017-06-12 ENCOUNTER — Encounter: Payer: Self-pay | Admitting: Certified Nurse Midwife

## 2017-06-12 VITALS — BP 112/76 | HR 99 | Wt 167.6 lb

## 2017-06-12 DIAGNOSIS — O09299 Supervision of pregnancy with other poor reproductive or obstetric history, unspecified trimester: Secondary | ICD-10-CM

## 2017-06-12 DIAGNOSIS — R7989 Other specified abnormal findings of blood chemistry: Secondary | ICD-10-CM

## 2017-06-12 DIAGNOSIS — R946 Abnormal results of thyroid function studies: Secondary | ICD-10-CM

## 2017-06-12 DIAGNOSIS — Z283 Underimmunization status: Secondary | ICD-10-CM

## 2017-06-12 DIAGNOSIS — O09293 Supervision of pregnancy with other poor reproductive or obstetric history, third trimester: Secondary | ICD-10-CM

## 2017-06-12 DIAGNOSIS — Z2839 Other underimmunization status: Secondary | ICD-10-CM

## 2017-06-12 DIAGNOSIS — Z3480 Encounter for supervision of other normal pregnancy, unspecified trimester: Secondary | ICD-10-CM

## 2017-06-12 DIAGNOSIS — O09893 Supervision of other high risk pregnancies, third trimester: Secondary | ICD-10-CM

## 2017-06-12 DIAGNOSIS — Z789 Other specified health status: Secondary | ICD-10-CM

## 2017-06-12 DIAGNOSIS — O09899 Supervision of other high risk pregnancies, unspecified trimester: Secondary | ICD-10-CM

## 2017-06-12 NOTE — Progress Notes (Signed)
   PRENATAL VISIT NOTE  Subjective:  Jacqueline Irwin is a 26 y.o. G3P0110 at 69w0dbeing seen today for ongoing prenatal care.  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  Patient reports no complaints.  Contractions: Irritability. Vag. Bleeding: None.  Movement: Present. Denies leaking of fluid.   The following portions of the patient's history were reviewed and updated as appropriate: allergies, current medications, past family history, past medical history, past social history, past surgical history and problem list. Problem list updated.  Objective:   Vitals:   06/12/17 1012  BP: 112/76  Pulse: 99  Weight: 167 lb 9.6 oz (76 kg)    Fetal Status: Fetal Heart Rate (bpm): 128 Fundal Height: 32 cm Movement: Present     General:  Alert, oriented and cooperative. Patient is in no acute distress.  Skin: Skin is warm and dry. No rash noted.   Cardiovascular: Normal heart rate noted  Respiratory: Normal respiratory effort, no problems with respiration noted  Abdomen: Soft, gravid, appropriate for gestational age.  Pain/Pressure: Absent     Pelvic: Cervical exam deferred        Extremities: Normal range of motion.  Edema: None  Mental Status:  Normal mood and affect. Normal behavior. Normal judgment and thought content.   Assessment and Plan:  Pregnancy: G3P0110 at 349w0d1. Supervision of other normal pregnancy, antepartum     Doing well.  SW met with patient and spouse in office.   2. Non-English speaking patient     Video interpreted used  3. Hx of preeclampsia, prior pregnancy, currently pregnant     Taking baby ASA  4. Maternal varicella,  non-immune     Varicella postpartum  5. Abnormal thyroid stimulating hormone (TSH) level     Normal workup at endocrinology  Preterm labor symptoms and general obstetric precautions including but not limited to vaginal bleeding, contractions, leaking of fluid and fetal movement were reviewed in detail with the patient. Please refer to After Visit Summary for other counseling recommendations.  Return in about 2 weeks (around 06/26/2017) for ROB, GBS.   RaMorene CrockerCNM

## 2017-06-21 ENCOUNTER — Ambulatory Visit (HOSPITAL_COMMUNITY)
Admission: RE | Admit: 2017-06-21 | Discharge: 2017-06-21 | Disposition: A | Discharge status: Home / Self Care | Payer: Medicaid Other | Source: Ambulatory Visit | Attending: Certified Nurse Midwife | Admitting: Certified Nurse Midwife

## 2017-06-21 DIAGNOSIS — Z3A34 34 weeks gestation of pregnancy: Secondary | ICD-10-CM | POA: Diagnosis not present

## 2017-06-21 DIAGNOSIS — Z3483 Encounter for supervision of other normal pregnancy, third trimester: Secondary | ICD-10-CM | POA: Diagnosis present

## 2017-06-21 DIAGNOSIS — Z3480 Encounter for supervision of other normal pregnancy, unspecified trimester: Secondary | ICD-10-CM

## 2017-06-25 ENCOUNTER — Other Ambulatory Visit: Payer: Self-pay | Admitting: Certified Nurse Midwife

## 2017-06-25 DIAGNOSIS — Z3480 Encounter for supervision of other normal pregnancy, unspecified trimester: Secondary | ICD-10-CM

## 2017-06-26 ENCOUNTER — Ambulatory Visit (INDEPENDENT_AMBULATORY_CARE_PROVIDER_SITE_OTHER): Payer: Medicaid Other | Admitting: Certified Nurse Midwife

## 2017-06-26 ENCOUNTER — Other Ambulatory Visit (HOSPITAL_COMMUNITY)
Admission: RE | Admit: 2017-06-26 | Discharge: 2017-06-26 | Disposition: A | Discharge status: Home / Self Care | Payer: Medicaid Other | Source: Ambulatory Visit | Attending: Certified Nurse Midwife | Admitting: Certified Nurse Midwife

## 2017-06-26 ENCOUNTER — Encounter: Payer: Self-pay | Admitting: Certified Nurse Midwife

## 2017-06-26 VITALS — BP 109/73 | HR 91 | Wt 167.7 lb

## 2017-06-26 DIAGNOSIS — Z3483 Encounter for supervision of other normal pregnancy, third trimester: Secondary | ICD-10-CM

## 2017-06-26 DIAGNOSIS — O09293 Supervision of pregnancy with other poor reproductive or obstetric history, third trimester: Secondary | ICD-10-CM

## 2017-06-26 DIAGNOSIS — Z3A35 35 weeks gestation of pregnancy: Secondary | ICD-10-CM | POA: Diagnosis not present

## 2017-06-26 DIAGNOSIS — Z2839 Other underimmunization status: Secondary | ICD-10-CM

## 2017-06-26 DIAGNOSIS — Z283 Underimmunization status: Secondary | ICD-10-CM

## 2017-06-26 DIAGNOSIS — O09299 Supervision of pregnancy with other poor reproductive or obstetric history, unspecified trimester: Secondary | ICD-10-CM

## 2017-06-26 DIAGNOSIS — Z789 Other specified health status: Secondary | ICD-10-CM

## 2017-06-26 DIAGNOSIS — Z3480 Encounter for supervision of other normal pregnancy, unspecified trimester: Secondary | ICD-10-CM

## 2017-06-26 DIAGNOSIS — O09893 Supervision of other high risk pregnancies, third trimester: Secondary | ICD-10-CM

## 2017-06-26 DIAGNOSIS — O09899 Supervision of other high risk pregnancies, unspecified trimester: Secondary | ICD-10-CM

## 2017-06-26 LAB — OB RESULTS CONSOLE GBS: GBS: NEGATIVE

## 2017-06-26 NOTE — Progress Notes (Signed)
   PRENATAL VISIT NOTE  Subjective:  Jacqueline Irwin is a 26 y.o. G3P0110 at [redacted]w[redacted]d being seen today for ongoing prenatal care.  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  Patient reports backache, no bleeding, no leaking and occasional contractions.  Contractions: Irregular. Vag. Bleeding: None.  Movement: Present. Denies leaking of fluid.   The following portions of the patient's history were reviewed and updated as appropriate: allergies, current medications, past family history, past medical history, past social history, past surgical history and problem list. Problem list updated.  Objective:   Vitals:   06/26/17 0810  BP: 109/73  Pulse: 91  Weight: 167 lb 11.2 oz (76.1 kg)    Fetal Status: Fetal Heart Rate (bpm): 136 Fundal Height: 34 cm Movement: Present  Presentation: Vertex  General:  Alert, oriented and cooperative. Patient is in no acute distress.  Skin: Skin is warm and dry. No rash noted.   Cardiovascular: Normal heart rate noted  Respiratory: Normal respiratory effort, no problems with respiration noted  Abdomen: Soft, gravid, appropriate for gestational age.  Pain/Pressure: Absent     Pelvic: Cervical exam performed Dilation: Closed Effacement (%): 50 Station: -3  Extremities: Normal range of motion.  Edema: None  Mental Status:  Normal mood and affect. Normal behavior. Normal judgment and thought content.   Assessment and Plan:  Pregnancy: G3P0110 at [redacted]w[redacted]d  1. Non-English speaking patient     Video interpreter used  2. Supervision of other normal pregnancy, antepartum      Normal discomforts of pregnancy - Strep Gp B NAA -  Cervicovaginal ancillary only  3. Maternal varicella, non-immune     Varicella postpartum  4. Hx of preeclampsia, prior pregnancy, currently pregnant     To stop baby ASA next week.   Preterm labor symptoms and general obstetric precautions including but not limited to vaginal bleeding, contractions, leaking of fluid and fetal movement were reviewed in detail with the patient. Please refer to After Visit Summary for other counseling recommendations.  Return in about 1 week (around 07/03/2017) for ROB.   Roe Coombs, CNM

## 2017-06-26 NOTE — Progress Notes (Signed)
Pt complains of having low back pain sometimes

## 2017-06-27 LAB — CERVICOVAGINAL ANCILLARY ONLY
Bacterial vaginitis: NEGATIVE
Candida vaginitis: NEGATIVE
Chlamydia: NEGATIVE
NEISSERIA GONORRHEA: NEGATIVE
Trichomonas: NEGATIVE

## 2017-06-28 LAB — STREP GP B NAA: Strep Gp B NAA: NEGATIVE

## 2017-07-02 ENCOUNTER — Other Ambulatory Visit: Payer: Self-pay | Admitting: Certified Nurse Midwife

## 2017-07-02 DIAGNOSIS — Z3480 Encounter for supervision of other normal pregnancy, unspecified trimester: Secondary | ICD-10-CM

## 2017-07-05 ENCOUNTER — Ambulatory Visit (INDEPENDENT_AMBULATORY_CARE_PROVIDER_SITE_OTHER): Payer: Medicaid Other | Admitting: Certified Nurse Midwife

## 2017-07-05 ENCOUNTER — Encounter: Payer: Self-pay | Admitting: Certified Nurse Midwife

## 2017-07-05 VITALS — BP 107/73 | HR 93 | Wt 168.0 lb

## 2017-07-05 DIAGNOSIS — O09899 Supervision of other high risk pregnancies, unspecified trimester: Secondary | ICD-10-CM

## 2017-07-05 DIAGNOSIS — Z283 Underimmunization status: Secondary | ICD-10-CM

## 2017-07-05 DIAGNOSIS — Z3483 Encounter for supervision of other normal pregnancy, third trimester: Secondary | ICD-10-CM

## 2017-07-05 DIAGNOSIS — Z789 Other specified health status: Secondary | ICD-10-CM

## 2017-07-05 DIAGNOSIS — Z3480 Encounter for supervision of other normal pregnancy, unspecified trimester: Secondary | ICD-10-CM

## 2017-07-05 DIAGNOSIS — O09893 Supervision of other high risk pregnancies, third trimester: Secondary | ICD-10-CM

## 2017-07-05 DIAGNOSIS — Z2839 Other underimmunization status: Secondary | ICD-10-CM

## 2017-07-05 NOTE — Progress Notes (Signed)
   PRENATAL VISIT NOTE  Subjective:  Jacqueline Irwin is a 26 y.o. G3P0110 at 1022w2d being seen today for ongoing prenatal care.  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  Patient reports no complaints.  Contractions: Irregular. Vag. Bleeding: None.  Movement: Present. Denies leaking of fluid.   The following portions of the patient's history were reviewed and updated as appropriate: allergies, current medications, past family history, past medical history, past social history, past surgical history and problem list. Problem list updated.  Objective:   Vitals:   07/05/17 1106  BP: 107/73  Pulse: 93  Weight: 168 lb (76.2 kg)    Fetal Status: Fetal Heart Rate (bpm): 139; doppler Fundal Height: 35 cm Movement: Present     General:  Alert, oriented and cooperative. Patient is in no acute distress.  Skin: Skin is warm and dry. No rash noted.   Cardiovascular: Normal heart rate noted  Respiratory: Normal respiratory effort, no problems with respiration noted  Abdomen: Soft, gravid, appropriate for gestational age.  Pain/Pressure: Present     Pelvic: Cervical exam deferred        Extremities: Normal range of motion.  Edema: None  Mental Status:  Normal mood and affect. Normal behavior. Normal judgment and thought content.   Assessment and Plan:  Pregnancy: G3P0110 at 1822w2d  1. Supervision of other normal pregnancy, antepartum      Doing well.   2. Non-English speaking patient     Interpreter declined  3. Maternal varicella, non-immune     Varicella postpartum.   Preterm labor symptoms and general obstetric precautions including but not limited to  vaginal bleeding, contractions, leaking of fluid and fetal movement were reviewed in detail with the patient. Please refer to After Visit Summary for other counseling recommendations.  Return in about 1 week (around 07/12/2017) for ROB.   Roe Coombsachelle A Evadene Wardrip, CNM

## 2017-07-11 ENCOUNTER — Ambulatory Visit (INDEPENDENT_AMBULATORY_CARE_PROVIDER_SITE_OTHER): Payer: Medicaid Other | Admitting: Certified Nurse Midwife

## 2017-07-11 ENCOUNTER — Encounter: Payer: Self-pay | Admitting: Certified Nurse Midwife

## 2017-07-11 VITALS — BP 107/76 | HR 89 | Wt 167.0 lb

## 2017-07-11 DIAGNOSIS — Z283 Underimmunization status: Secondary | ICD-10-CM

## 2017-07-11 DIAGNOSIS — Z3480 Encounter for supervision of other normal pregnancy, unspecified trimester: Secondary | ICD-10-CM

## 2017-07-11 DIAGNOSIS — Z2839 Other underimmunization status: Secondary | ICD-10-CM

## 2017-07-11 DIAGNOSIS — O09899 Supervision of other high risk pregnancies, unspecified trimester: Secondary | ICD-10-CM

## 2017-07-11 DIAGNOSIS — O09299 Supervision of pregnancy with other poor reproductive or obstetric history, unspecified trimester: Secondary | ICD-10-CM

## 2017-07-11 DIAGNOSIS — O09893 Supervision of other high risk pregnancies, third trimester: Secondary | ICD-10-CM

## 2017-07-11 DIAGNOSIS — O09293 Supervision of pregnancy with other poor reproductive or obstetric history, third trimester: Secondary | ICD-10-CM

## 2017-07-11 DIAGNOSIS — Z789 Other specified health status: Secondary | ICD-10-CM

## 2017-07-11 NOTE — Progress Notes (Signed)
Patient reports good fetal movement with some pressure and irregular contractions. 

## 2017-07-11 NOTE — Progress Notes (Signed)
   PRENATAL VISIT NOTE  Subjective:  Jacqueline Irwin is a 26 y.o. G3P0110 at 4376w1d being seen today for ongoing prenatal care.  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  Patient reports no complaints.  Contractions: Irregular.  .  Movement: Present. Denies leaking of fluid.   The following portions of the patient's history were reviewed and updated as appropriate: allergies, current medications, past family history, past medical history, past social history, past surgical history and problem list. Problem list updated.  Objective:   Vitals:   07/11/17 0958  BP: 107/76  Pulse: 89  Weight: 167 lb (75.8 kg)    Fetal Status: Fetal Heart Rate (bpm): 137; doppler Fundal Height: 37 cm Movement: Present     General:  Alert, oriented and cooperative. Patient is in no acute distress.  Skin: Skin is warm and dry. No rash noted.   Cardiovascular: Normal heart rate noted  Respiratory: Normal respiratory effort, no problems with respiration noted  Abdomen: Soft, gravid, appropriate for gestational age.  Pain/Pressure: Present     Pelvic: Cervical exam deferred        Extremities: Normal range of motion.  Edema: None  Mental Status:  Normal mood and affect. Normal behavior. Normal judgment and thought content.   Assessment and Plan:  Pregnancy: G3P0110 at 976w1d  1. Supervision of other normal pregnancy, antepartum      SW aware of need for car seat: plan to meet with patient next week with car seat  2. Non-English speaking patient     Video interpreter used.    3. Maternal varicella, non-immune     Varicella postpartum  4. Hx of preeclampsia, prior  pregnancy, currently pregnant     B/P stable, completed baby ASA treatment.   Term labor symptoms and general obstetric precautions including but not limited to vaginal bleeding, contractions, leaking of fluid and fetal movement were reviewed in detail with the patient. Please refer to After Visit Summary for other counseling recommendations.  Return in about 1 week (around 07/18/2017) for ROB.   Roe Coombsachelle A Katsumi Wisler, CNM

## 2017-07-18 ENCOUNTER — Ambulatory Visit (INDEPENDENT_AMBULATORY_CARE_PROVIDER_SITE_OTHER): Payer: Medicaid Other | Admitting: Certified Nurse Midwife

## 2017-07-18 ENCOUNTER — Encounter: Payer: Self-pay | Admitting: Certified Nurse Midwife

## 2017-07-18 VITALS — BP 100/68 | HR 93 | Wt 169.0 lb

## 2017-07-18 DIAGNOSIS — Z23 Encounter for immunization: Secondary | ICD-10-CM

## 2017-07-18 DIAGNOSIS — O09293 Supervision of pregnancy with other poor reproductive or obstetric history, third trimester: Secondary | ICD-10-CM

## 2017-07-18 DIAGNOSIS — Z283 Underimmunization status: Secondary | ICD-10-CM

## 2017-07-18 DIAGNOSIS — O09893 Supervision of other high risk pregnancies, third trimester: Secondary | ICD-10-CM | POA: Diagnosis not present

## 2017-07-18 DIAGNOSIS — Z3480 Encounter for supervision of other normal pregnancy, unspecified trimester: Secondary | ICD-10-CM

## 2017-07-18 DIAGNOSIS — Z789 Other specified health status: Secondary | ICD-10-CM

## 2017-07-18 DIAGNOSIS — O09299 Supervision of pregnancy with other poor reproductive or obstetric history, unspecified trimester: Secondary | ICD-10-CM

## 2017-07-18 DIAGNOSIS — O09899 Supervision of other high risk pregnancies, unspecified trimester: Secondary | ICD-10-CM

## 2017-07-18 DIAGNOSIS — Z2839 Other underimmunization status: Secondary | ICD-10-CM

## 2017-07-18 NOTE — Progress Notes (Signed)
   PRENATAL VISIT NOTE  Subjective:  Jacqueline Irwin is a 26 y.o. G3P0110 at 8107w1d being seen today for ongoing prenatal care.  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  Patient reports no complaints.  Contractions: Irregular. Vag. Bleeding: None.  Movement: Present. Denies leaking of fluid.   The following portions of the patient's history were reviewed and updated as appropriate: allergies, current medications, past family history, past medical history, past social history, past surgical history and problem list. Problem list updated.  Objective:   Vitals:   07/18/17 0905  BP: 100/68  Pulse: 93  Weight: 169 lb (76.7 kg)    Fetal Status: Fetal Heart Rate (bpm): NST; reactive Fundal Height: 38 cm Movement: Present     General:  Alert, oriented and cooperative. Patient is in no acute distress.  Skin: Skin is warm and dry. No rash noted.   Cardiovascular: Normal heart rate noted  Respiratory: Normal respiratory effort, no problems with respiration noted  Abdomen: Soft, gravid, appropriate for gestational age.  Pain/Pressure: Present     Pelvic: Cervical exam deferred        Extremities: Normal range of motion.  Edema: None  Mental Status:  Normal mood and affect. Normal behavior. Normal judgment and thought content.  NST: + accels, no decels, moderate variability, Cat. 1 tracing. No contractions on toco.   Assessment and Plan:  Pregnancy: G3P0110 at 23107w1d  1. Supervision of other normal pregnancy, antepartum     NST for reported decreased fetal movement: normal NST - Fetal nonstress test; Future - Tdap vaccine greater than or equal to 7yo IM -  Flu Vaccine QUAD 36+ mos IM (Fluarix, Quad PF)  2. Hx of preeclampsia, prior pregnancy, currently pregnant     Completed baby ASA tx.   3. Maternal varicella, non-immune     Varicella postpartum  4. Non-English speaking patient     Video interpreter used.   Term labor symptoms and general obstetric precautions including but not limited to vaginal bleeding, contractions, leaking of fluid and fetal movement were reviewed in detail with the patient. Please refer to After Visit Summary for other counseling recommendations.  Return in about 1 week (around 07/25/2017) for ROB.   Roe Coombsachelle A Mahati Vajda, CNM

## 2017-07-19 ENCOUNTER — Inpatient Hospital Stay (HOSPITAL_COMMUNITY): Payer: Medicaid Other | Admitting: Anesthesiology

## 2017-07-19 ENCOUNTER — Inpatient Hospital Stay (HOSPITAL_COMMUNITY)
Admission: AD | Admit: 2017-07-19 | Discharge: 2017-07-22 | DRG: 775 | Disposition: A | Discharge status: Home / Self Care | Payer: Medicaid Other | Source: Ambulatory Visit | Attending: Obstetrics and Gynecology | Admitting: Obstetrics and Gynecology

## 2017-07-19 ENCOUNTER — Encounter (HOSPITAL_COMMUNITY): Payer: Self-pay

## 2017-07-19 DIAGNOSIS — Z3A38 38 weeks gestation of pregnancy: Secondary | ICD-10-CM

## 2017-07-19 DIAGNOSIS — N90810 Female genital mutilation status, unspecified: Secondary | ICD-10-CM | POA: Diagnosis present

## 2017-07-19 DIAGNOSIS — Z3480 Encounter for supervision of other normal pregnancy, unspecified trimester: Secondary | ICD-10-CM

## 2017-07-19 DIAGNOSIS — O3483 Maternal care for other abnormalities of pelvic organs, third trimester: Secondary | ICD-10-CM | POA: Diagnosis present

## 2017-07-19 DIAGNOSIS — O26893 Other specified pregnancy related conditions, third trimester: Secondary | ICD-10-CM | POA: Diagnosis present

## 2017-07-19 HISTORY — DX: Other specified health status: Z78.9

## 2017-07-19 LAB — TYPE AND SCREEN
ABO/RH(D): B POS
Antibody Screen: NEGATIVE

## 2017-07-19 LAB — CBC
HEMATOCRIT: 33.7 % — AB (ref 36.0–46.0)
Hemoglobin: 11.5 g/dL — ABNORMAL LOW (ref 12.0–15.0)
MCH: 28.2 pg (ref 26.0–34.0)
MCHC: 34.1 g/dL (ref 30.0–36.0)
MCV: 82.6 fL (ref 78.0–100.0)
Platelets: 171 10*3/uL (ref 150–400)
RBC: 4.08 MIL/uL (ref 3.87–5.11)
RDW: 14.1 % (ref 11.5–15.5)
WBC: 9.6 10*3/uL (ref 4.0–10.5)

## 2017-07-19 LAB — RPR: RPR Ser Ql: NONREACTIVE

## 2017-07-19 LAB — POCT FERN TEST: POCT FERN TEST: POSITIVE

## 2017-07-19 LAB — ABO/RH: ABO/RH(D): B POS

## 2017-07-19 MED ORDER — FENTANYL 2.5 MCG/ML BUPIVACAINE 1/10 % EPIDURAL INFUSION (WH - ANES)
14.0000 mL/h | INTRAMUSCULAR | Status: DC | PRN
  Administered 2017-07-19 (×2): 14 mL/h via EPIDURAL
  Filled 2017-07-19 (×2): qty 100

## 2017-07-19 MED ORDER — LIDOCAINE HCL (PF) 1 % IJ SOLN
30.0000 mL | INTRAMUSCULAR | Status: DC | PRN
  Filled 2017-07-19: qty 30

## 2017-07-19 MED ORDER — NALBUPHINE HCL 10 MG/ML IJ SOLN
5.0000 mg | INTRAMUSCULAR | Status: DC | PRN
Start: 2017-07-19 — End: 2017-07-20

## 2017-07-19 MED ORDER — LACTATED RINGERS IV SOLN
500.0000 mL | INTRAVENOUS | Status: DC | PRN
  Administered 2017-07-19: 500 mL via INTRAVENOUS

## 2017-07-19 MED ORDER — MISOPROSTOL 200 MCG PO TABS
50.0000 ug | ORAL_TABLET | ORAL | Status: DC | PRN
  Administered 2017-07-19: 50 ug via ORAL
  Filled 2017-07-19: qty 1

## 2017-07-19 MED ORDER — ACETAMINOPHEN 325 MG PO TABS
650.0000 mg | ORAL_TABLET | ORAL | Status: DC | PRN
  Administered 2017-07-20: 650 mg via ORAL
  Filled 2017-07-19: qty 2

## 2017-07-19 MED ORDER — OXYCODONE-ACETAMINOPHEN 5-325 MG PO TABS: 2.0000 | ORAL_TABLET | ORAL | Status: DC | PRN

## 2017-07-19 MED ORDER — LIDOCAINE HCL (PF) 1 % IJ SOLN
INTRAMUSCULAR | Status: DC | PRN
  Administered 2017-07-19: 7 mL via EPIDURAL
  Administered 2017-07-19: 6 mL via EPIDURAL

## 2017-07-19 MED ORDER — OXYTOCIN 40 UNITS IN LACTATED RINGERS INFUSION - SIMPLE MED
2.5000 [IU]/h | INTRAVENOUS | Status: DC
  Filled 2017-07-19: qty 1000

## 2017-07-19 MED ORDER — LACTATED RINGERS IV SOLN
INTRAVENOUS | Status: DC
  Administered 2017-07-19 – 2017-07-20 (×4): via INTRAVENOUS

## 2017-07-19 MED ORDER — LACTATED RINGERS IV SOLN: 500.0000 mL | Freq: Once | INTRAVENOUS | Status: DC

## 2017-07-19 MED ORDER — OXYTOCIN BOLUS FROM INFUSION
500.0000 mL | Freq: Once | INTRAVENOUS | Status: AC
  Administered 2017-07-20: 500 mL via INTRAVENOUS

## 2017-07-19 MED ORDER — EPHEDRINE 5 MG/ML INJ
10.0000 mg | INTRAVENOUS | Status: DC | PRN
  Filled 2017-07-19: qty 2

## 2017-07-19 MED ORDER — PHENYLEPHRINE 40 MCG/ML (10ML) SYRINGE FOR IV PUSH (FOR BLOOD PRESSURE SUPPORT)
80.0000 ug | PREFILLED_SYRINGE | INTRAVENOUS | Status: DC | PRN
  Filled 2017-07-19: qty 5

## 2017-07-19 MED ORDER — DIPHENHYDRAMINE HCL 50 MG/ML IJ SOLN: 12.5000 mg | INTRAMUSCULAR | Status: DC | PRN

## 2017-07-19 MED ORDER — TERBUTALINE SULFATE 1 MG/ML IJ SOLN
0.2500 mg | Freq: Once | INTRAMUSCULAR | Status: DC | PRN
  Filled 2017-07-19: qty 1

## 2017-07-19 MED ORDER — ONDANSETRON HCL 4 MG/2ML IJ SOLN: 4.0000 mg | Freq: Four times a day (QID) | INTRAMUSCULAR | Status: DC | PRN

## 2017-07-19 MED ORDER — OXYCODONE-ACETAMINOPHEN 5-325 MG PO TABS
1.0000 | ORAL_TABLET | ORAL | Status: DC | PRN
Start: 2017-07-19 — End: 2017-07-20

## 2017-07-19 MED ORDER — PHENYLEPHRINE 40 MCG/ML (10ML) SYRINGE FOR IV PUSH (FOR BLOOD PRESSURE SUPPORT)
80.0000 ug | PREFILLED_SYRINGE | INTRAVENOUS | Status: DC | PRN
  Filled 2017-07-19: qty 5
  Filled 2017-07-19: qty 10

## 2017-07-19 MED ORDER — OXYTOCIN 40 UNITS IN LACTATED RINGERS INFUSION - SIMPLE MED
1.0000 m[IU]/min | INTRAVENOUS | Status: DC
  Administered 2017-07-19: 2 m[IU]/min via INTRAVENOUS

## 2017-07-19 MED ORDER — SOD CITRATE-CITRIC ACID 500-334 MG/5ML PO SOLN: 30.0000 mL | ORAL | Status: DC | PRN

## 2017-07-19 NOTE — Progress Notes (Signed)
Labor Progress Note  S: Patient seen & examined for progress of labor. Patient comfortable with epidural.   O: BP 118/81   Pulse 80   Temp 97.9 F (36.6 C) (Oral)   Resp 16   Ht  (1.626 m)   Wt 76.7 kg (169 lb)   LMP 10/24/2016   SpO2 100%   BMI 29.01 kg/m   FHT: 125bpm, mod var, +accels, no decels TOCO: q1-65min, patient looks comfortable during contractions  CVE: Dilation: 3 Effacement (%): 70 Station: -2 Presentation: Vertex Exam by:: Gifford Shave RN   A&P: 26 y.o. G3P0110 [redacted]w[redacted]d here for SROM with SOL.  S/p 50 mcg PO cytotec x 1 Recently received epidural Will manage expectantly for now due to frequent contractions.  Will consider starting pitocin if contractions become less frequent or cervix does not change at next cervical check. Continue expectant management Anticipate SVD  [name], MD [facility] Resident PGY-1 07/19/2017 2:38 PM

## 2017-07-19 NOTE — Anesthesia Procedure Notes (Signed)
Epidural Patient location during procedure: OB Start time: 07/19/2017 2:19 PM End time: 07/19/2017 2:23 PM  Staffing Anesthesiologist: Leilani Able Performed: anesthesiologist   Preanesthetic Checklist Completed: patient identified, surgical consent, pre-op evaluation, timeout performed, IV checked, risks and benefits discussed and monitors and equipment checked  Epidural Patient position: sitting Prep: site prepped and draped and DuraPrep Patient monitoring: continuous pulse ox and blood pressure Approach: midline Location: L3-L4 Injection technique: LOR air  Needle:  Needle type: Tuohy  Needle gauge: 17 G Needle length: 9 cm and 9 Needle insertion depth: 5 cm cm Catheter type: closed end flexible Catheter size: 19 Gauge Catheter at skin depth: 10 cm Test dose: negative and Other  Assessment Sensory level: T9 Events: blood not aspirated, injection not painful, no injection resistance, negative IV test and no paresthesia

## 2017-07-19 NOTE — Progress Notes (Signed)
Bedside US: VERTEX Heather Hogan 8:00 AM 07/19/17

## 2017-07-19 NOTE — Anesthesia Pain Management Evaluation Note (Signed)
  CRNA Pain Management Visit Note  Patient: Jacqueline Irwin, 26 y.o., female  "Hello I am a member of the anesthesia team at [facility]. We have an anesthesia team available at all times to provide care throughout the hospital, including epidural management and anesthesia for C-section. I don't know your plan for the delivery whether it a natural birth, water birth, IV sedation, nitrous supplementation, doula or epidural, but we want to meet your pain goals."   1.Was your pain managed to your expectations on prior hospitalizations?   No prior hospitalizations  2.What is your expectation for pain management during this hospitalization?     Epidural  3.How can we help you reach that goal? epidural  Record the patient's initial score and the patient's pain goal.   Pain: 0  Pain Goal: 4 [facility] wants you to be able to say your pain was always managed very well.  Jacqueline Irwin 07/19/2017

## 2017-07-19 NOTE — Anesthesia Preprocedure Evaluation (Signed)
Anesthesia Evaluation  Patient identified by MRN, date of birth, ID band Patient awake    Reviewed: Allergy & Precautions, H&P , NPO status , Patient's Chart, lab work & pertinent test results  Airway Mallampati: I  TM Distance: >3 FB Neck ROM: full    Dental no notable dental hx. (+) Teeth Intact   Pulmonary neg pulmonary ROS,    Pulmonary exam normal breath sounds clear to auscultation       Cardiovascular negative cardio ROS Normal cardiovascular exam Rhythm:regular Rate:Normal     Neuro/Psych negative neurological ROS  negative psych ROS   GI/Hepatic negative GI ROS, Neg liver ROS,   Endo/Other  negative endocrine ROS  Renal/GU negative Renal ROS     Musculoskeletal   Abdominal Normal abdominal exam  (+)   Peds  Hematology  (+) Blood dyscrasia, anemia ,   Anesthesia Other Findings   Reproductive/Obstetrics (+) Pregnancy                             Anesthesia Physical Anesthesia Plan  ASA: II  Anesthesia Plan: Epidural   Post-op Pain Management:    Induction:   PONV Risk Score and Plan:   Airway Management Planned:   Additional Equipment:   Intra-op Plan:   Post-operative Plan:   Informed Consent: I have reviewed the patients History and Physical, chart, labs and discussed the procedure including the risks, benefits and alternatives for the proposed anesthesia with the patient or authorized representative who has indicated his/her understanding and acceptance.       Plan Discussed with:   Anesthesia Plan Comments:         Anesthesia Quick Evaluation  

## 2017-07-19 NOTE — H&P (Signed)
LABOR ADMISSION HISTORY AND PHYSICAL  Jacqueline Irwin is a 26 y.o. female G3P0110 with IUP at [redacted]w[redacted]d by LMP presenting for SOL with SROM @ 0330. She reports +FMs, No LOF, no VB, no blurry vision, no headaches, no peripheral edema, and no RUQ pain.  She plans on breast feeding. She is undecided for birth control.  Dating: By LMP --->  Estimated Date of Delivery: 07/31/17  Sono:   , CWD, normal anatomy, cephalic presentation, 2115g, 91% EFW  Patient initiated prenatal care at [redacted]w[redacted]d weeks at [facility].  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 Medical history non-contributory     Past Surgical History: Past Surgical History:  Procedure Laterality Date  . abcess of vulva    . APPENDECTOMY      Obstetrical History: OB History    Gravida Para Term Preterm AB Living   SAB TAB Ectopic Multiple Live Births   1              Social History: Social History   Social History  . Marital status: Married    Spouse name: N/A  . Number of children: N/A  . Years of education: N/A   Social History Main Topics  . Smoking status: Never Smoker  . Smokeless tobacco: Never Used  . Alcohol use No  . Drug use: No  . Sexual activity: Yes    Birth control/ protection: None   Other Topics Concern  . None   Social History Narrative  . None    Family History: Family History  Problem Relation Age of Onset  . Diabetes Mother   . Diabetes Paternal Grandmother   . Hypertension Paternal Grandmother     Allergies: No Known Allergies  Prescriptions Prior to Admission  Medication Sig Dispense Refill Last Dose  . omeprazole (PRILOSEC) 20 MG capsule Take 1 capsule (20 mg total) by mouth 2 (two) times daily before a meal. 60 capsule 5 07/18/2017 at Unknown time  .  Prenat-FeFum-FePo-FA-DHA w/o A (PROVIDA DHA) 16-16-1.25-110 MG CAPS Take 1 tablet by mouth daily. 30 capsule 12 07/18/2017 at Unknown time     Review of Systems   All systems reviewed and negative except as stated in HPI  Blood pressure 119/85, pulse 100, temperature 98.2 F (36.8 C), temperature source Oral, resp. rate 16, height  (1.626 m), weight 76.7 kg (169 lb), last menstrual period 10/24/2016, SpO2 100 %. General appearance: alert, cooperative, appears stated age and no distress Lungs: normal work of breathing Extremities: Homans sign is negative, no sign of DVT Presentation: cephalic Fetal monitoringBaseline: 130 bpm, Variability: Good {> 6 bpm), Accelerations: Reactive and Decelerations: Absent Uterine activityFrequency: Every 3 minutes     Prenatal labs: ABO, Rh: --/--/B POS (09/14 0815) Antibody: NEG (09/14 0815) Rubella: Immune (04/03 1624) RPR: Non Reactive (06/26 1150)  HBsAg: Negative (04/03 1624)  HIV:   Non Reactive (06/26 1150) GBS: Negative (08/22 0913)  GTT: Fasting- 74, 1 hr- 119, 2 hr- 86  Prenatal Transfer Tool  Maternal Diabetes: No Genetic Screening: Normal Maternal Ultrasounds/Referrals: Normal Fetal Ultrasounds or other Referrals:  None Maternal Substance Abuse:  No Significant Maternal Medications:  None Significant Maternal Lab Results: Lab values include: Group B Strep negative  Results for orders placed or performed during the hospital encounter of 07/19/17 (  from the past 24 hour(s))  Fern Test   Collection Time: 07/19/17  8:01 AM  Result Value Ref Range   POCT Fern Test Positive = ruptured amniotic membanes   CBC   Collection Time: 07/19/17  8:10 AM  Result Value Ref Range   WBC 9.6 4.0 - 10.5 K/uL   RBC 4.08 3.87 - 5.11 MIL/uL   Hemoglobin 11.5 (L) 12.0 - 15.0 g/dL   HCT 60.4 (L) 54.0 - 98.1 %   MCV 82.6 78.0 - 100.0 fL   MCH 28.2 26.0 - 34.0 pg   MCHC 34.1 30.0 - 36.0 g/dL   RDW 19.1 47.8 - 29.5 %   Platelets 171 150 - 400  K/uL  Type and screen [facility]   Collection Time: 07/19/17  8:15 AM  Result Value Ref Range   ABO/RH(D) B POS    Antibody Screen NEG    Sample Expiration 07/22/2017     Patient Active Problem List   Diagnosis Date Noted  . Normal labor 07/19/2017  . Abnormal thyroid stimulating hormone (TSH) level 02/12/2017  . Maternal varicella, non-immune 02/07/2017  . Non-English speaking patient 02/06/2017  . Lichen sclerosus 02/06/2017  . 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 Hx of preeclampsia, prior pregnancy, currently pregnant 02/06/2017  . Supervision of normal pregnancy, antepartum 02/05/2017    Assessment: Jacqueline Irwin is a 26 y.o. G3P0110 at [redacted]w[redacted]d here for SOL with SROM.  #Labor: experiencing frequent, painful contractions, so we will expectantly manage for now #Pain: Epidural upon request  #FWB: Cat 1 #ID: GBS [id number] #MOF: breast #MOC: undecided, wants to know options #Circ:  Outpatient if boy  Jacqueline Octave, MD Family Medicine Resident PGY-1  07/19/2017, 10:43 AM  CNM attestation:  I have seen and examined this patient; I agree with above documentation in the resident's note.   Jacqueline Irwin is a 26 y.o. [id number] here for SROM , possibly latent labor  PE: BP 119/78   Pulse 73   Temp 97.9 F (36.6 C) (Oral)   Resp 16   Ht  (1.626 m)   Wt 76.7 kg (169 lb)   LMP 10/24/2016   SpO2 100%   BMI 29.01 kg/m  Gen: calm comfortable, NAD Resp: normal effort, no distress Abd: gravid  ROS, labs, PMH reviewed  Plan: Admit to [employer] Will manage expectantly initially and then induce/augment prn Anticipate SVD  [name] CNM 07/19/2017, 4:22 PM

## 2017-07-19 NOTE — MAU Note (Signed)
?   SROM since 330 am; clear fluid  +contractions every 10-15 min--rating pain 5/10  Denies any complications with this pregnancy.

## 2017-07-19 NOTE — Progress Notes (Signed)
Labor Progress Note  S: Patient seen & examined for progress of labor. Patient comfortable with epidural.   O: BP 121/80   Pulse 73   Temp 98.9 F (37.2 C) (Oral)   Resp 17   Ht  (1.626 m)   Wt 76.7 kg (169 lb)   LMP 10/24/2016   SpO2 100%   BMI 29.01 kg/m   FHT: 125bpm, mod var, +accels, no decels TOCO: q1-20min, patient looks comfortable during contractions  CVE: Dilation: 4 Effacement (%): 70 Station: -1 Presentation: Vertex Exam by:: Gifford Shave RN   A&P: 26 y.o. G3P0110 [redacted]w[redacted]d here for SROM with SOL.  S/p 50 mcg PO cytotec x 1 Will start pitocin 2x2 due to slow cervical change. Anticipate SVD  [name], MD [facility] Resident PGY-1 07/19/2017 5:46 PM

## 2017-07-20 ENCOUNTER — Encounter (HOSPITAL_COMMUNITY): Payer: Self-pay

## 2017-07-20 DIAGNOSIS — Z3A38 38 weeks gestation of pregnancy: Secondary | ICD-10-CM

## 2017-07-20 MED ORDER — MINERAL OIL PO OIL
TOPICAL_OIL | ORAL | Status: DC | PRN
  Administered 2017-07-20: 30 mL
  Filled 2017-07-20 (×2): qty 30

## 2017-07-20 MED ORDER — ONDANSETRON HCL 4 MG PO TABS: 4.0000 mg | ORAL_TABLET | ORAL | Status: DC | PRN

## 2017-07-20 MED ORDER — TETANUS-DIPHTH-ACELL PERTUSSIS 5-2.5-18.5 LF-MCG/0.5 IM SUSP: 0.5000 mL | Freq: Once | INTRAMUSCULAR | Status: DC

## 2017-07-20 MED ORDER — IBUPROFEN 600 MG PO TABS
600.0000 mg | ORAL_TABLET | Freq: Four times a day (QID) | ORAL | Status: DC
  Administered 2017-07-20 – 2017-07-22 (×10): 600 mg via ORAL
  Filled 2017-07-20 (×10): qty 1

## 2017-07-20 MED ORDER — COCONUT OIL OIL: 1.0000 "application " | TOPICAL_OIL | Status: DC | PRN

## 2017-07-20 MED ORDER — SENNOSIDES-DOCUSATE SODIUM 8.6-50 MG PO TABS
2.0000 | ORAL_TABLET | ORAL | Status: DC
  Administered 2017-07-20 – 2017-07-21 (×2): 2 via ORAL
  Filled 2017-07-20 (×2): qty 2

## 2017-07-20 MED ORDER — ACETAMINOPHEN 325 MG PO TABS
650.0000 mg | ORAL_TABLET | ORAL | Status: DC | PRN
  Administered 2017-07-20 – 2017-07-21 (×5): 650 mg via ORAL
  Filled 2017-07-20 (×5): qty 2

## 2017-07-20 MED ORDER — ZOLPIDEM TARTRATE 5 MG PO TABS: 5.0000 mg | ORAL_TABLET | Freq: Every evening | ORAL | Status: DC | PRN

## 2017-07-20 MED ORDER — DIBUCAINE 1 % RE OINT: 1.0000 "application " | TOPICAL_OINTMENT | RECTAL | Status: DC | PRN

## 2017-07-20 MED ORDER — ONDANSETRON HCL 4 MG/2ML IJ SOLN: 4.0000 mg | INTRAMUSCULAR | Status: DC | PRN

## 2017-07-20 MED ORDER — DIPHENHYDRAMINE HCL 25 MG PO CAPS: 25.0000 mg | ORAL_CAPSULE | Freq: Four times a day (QID) | ORAL | Status: DC | PRN

## 2017-07-20 MED ORDER — BENZOCAINE-MENTHOL 20-0.5 % EX AERO: 1.0000 "application " | INHALATION_SPRAY | CUTANEOUS | Status: DC | PRN

## 2017-07-20 MED ORDER — PRENATAL MULTIVITAMIN CH
1.0000 | ORAL_TABLET | Freq: Every day | ORAL | Status: DC
  Administered 2017-07-20 – 2017-07-21 (×2): 1 via ORAL
  Filled 2017-07-20 (×2): qty 1

## 2017-07-20 MED ORDER — WITCH HAZEL-GLYCERIN EX PADS: 1.0000 "application " | MEDICATED_PAD | CUTANEOUS | Status: DC | PRN

## 2017-07-20 MED ORDER — SIMETHICONE 80 MG PO CHEW: 80.0000 mg | CHEWABLE_TABLET | ORAL | Status: DC | PRN

## 2017-07-20 NOTE — Anesthesia Postprocedure Evaluation (Signed)
Anesthesia Post Note  Patient: Jacqueline Irwin  Procedure(s) Performed: * No procedures listed *     Patient location during evaluation: Mother Baby Anesthesia Type: Epidural Level of consciousness: awake Pain management: satisfactory to patient Vital Signs Assessment: post-procedure vital signs reviewed and stable Respiratory status: spontaneous breathing Cardiovascular status: stable Anesthetic complications: no    Last Vitals:  Vitals:   07/20/17 0715 07/20/17 0815  BP: 114/79 106/73  Pulse: 78 82  Resp: 16 16  Temp: 37.2 C 37.2 C  SpO2: 100% 100%    Last Pain:  Vitals:   07/20/17 0815  TempSrc: Oral  PainSc: 1    Pain Goal: Patients Stated Pain Goal: 0 (07/19/17 0759)               Cephus Shelling

## 2017-07-20 NOTE — Progress Notes (Signed)
Patient complete and pushing x1 hour.  No station change in last thirty minutes.   FHT: baseline 145, moderate variability, +accels, early decels Toco: q 2 min   Plan to rest from pushing and labor down x30 minutes.  Expect NSVD.    West Pugh, DO PGY-2 Family Medicine Resident 07/20/17 [phone]

## 2017-07-20 NOTE — Lactation Note (Signed)
This note was copied from a baby's chart. Lactation Consultation Note: [name] at the bedside for Arabic language. This is mothers first child. Infant is 4 hours old. Infant fed at 11:30 for 45 mins. Mother reports that she felt strong tugging when infant was feeding. Assist with hand expression.  Mother has good flow of colostrum. Assist mother with cross cradle hold. Infant latched for only a few mins. Observed a few sucks. Attempt to latch to alternate breast in football hold. Mother reports that this is an easier position for her. Encouraged mother to do frequent skin to skin and cue base feed infant . Advised mother to feed infant at least 8-12 times in 24 hours. Discussed cluster feeding. Mother was given Lactation brochure and all basic breastfeeding teaching reviewed. Mother informed of all available LC services.   Patient Name: Jacqueline Irwin [id number] Date: 07/20/2017 Reason for consult: Initial assessment   Maternal Data    Feeding Feeding Type: Breast Fed Length of feed:  (infant sleepy no sustained latch)  LATCH Score Latch: Repeated attempts needed to sustain latch, nipple held in mouth throughout feeding, stimulation needed to elicit sucking reflex.  Audible Swallowing: None  Type of Nipple: Everted at rest and after stimulation  Comfort (Breast/Nipple): Soft / non-tender  Hold (Positioning): Assistance needed to correctly position infant at breast and maintain latch.  LATCH Score: 6  Interventions Interventions: Breast feeding basics reviewed;Assisted with latch;Skin to skin;Breast massage;Hand express;Breast compression;Adjust position;Support pillows;Position options;Expressed milk  Lactation Tools Discussed/Used     Consult Status Consult Status: Follow-up Date: 07/20/17 Follow-up type: In-patient    Stevan Born [facility] 07/20/2017, 3:29 PM

## 2017-07-21 MED ORDER — IBUPROFEN 600 MG PO TABS: 600.0000 mg | ORAL_TABLET | Freq: Four times a day (QID) | ORAL | 0 refills | Status: DC

## 2017-07-21 MED ORDER — SENNOSIDES-DOCUSATE SODIUM 8.6-50 MG PO TABS: 2.0000 | ORAL_TABLET | ORAL | 0 refills | Status: DC

## 2017-07-21 NOTE — Progress Notes (Signed)
Patient changed her mind and decided she wanted to stay one more night for ongoing support with routine couplet care.  Post Partum Day 1  Subjective:  Tucker Ahmed Vogan is a 26 y.o. [id number] [redacted]w[redacted]d s/p NSVD.  No acute events overnight.  Pt is doing well today.  Objective: BP (!) 103/58 (BP Location: Left Arm)   Pulse 74   Temp 98.9 F (37.2 C) (Oral)   Resp 18   Ht  (1.626 m)   Wt 76.7 kg (169 lb)   LMP 10/24/2016   SpO2 100%   Breastfeeding? Unknown   BMI 29.01 kg/m   Physical Exam:  General: alert, cooperative and no distress Lochia:normal flow Chest: CTAB Heart: RRR no m/r/g Abdomen: +BS, soft, nontender, fundus firm at/below umbilicus Uterine Fundus: firm DVT Evaluation: No evidence of DVT seen on physical exam. Extremities: no edema   Recent Labs  07/19/17 0810  HGB 11.5*  HCT 33.7*    Assessment/Plan:  ASSESSMENT: Jamilet Ahmed Doyle is a 26 y.o. G9F6213 [redacted]w[redacted]d ppd #1 s/p NSVD doing well. Continue routine couplet care.   Plan for discharge tomorrow   LOS: 2 days   Nadiah Corbit 07/21/2017, 11:32 AM

## 2017-07-21 NOTE — Discharge Instructions (Signed)

## 2017-07-21 NOTE — Lactation Note (Signed)
This note was copied from a baby's chart. Lactation Consultation Note  Patient Name: Jacqueline Irwin Date: 07/21/2017 Reason for consult: Follow-up assessment   Video interpreter used for Arabic. P1. Baby 39 hours old.  Changed diaper to wake baby. Mother easily expressed drops of colostrum prior to latching. Mother latched baby in cross cradle hold.  Lips were tight so assisted with flanging lips. Intermittent sucks and swallows observed. Mother states she is breastfeeding q 1.5-2.5 hours. Praised mother for her efforts. Provided mother with manual pump.  Recommend either hand expressing or post pumping after feedings w/ manual pump and give volume back to baby. Mother agreeable.  Discussed spoon feeding. Baby sustained latch for 10 min and mother stopped when visitors arrived. Mother denies questions at this time.    Maternal Data Has patient been taught Hand Expression?: Yes Does the patient have breastfeeding experience prior to this delivery?: No  Feeding Feeding Type: Breast Fed Length of feed: 20 min  LATCH Score Latch: Grasps breast easily, tongue down, lips flanged, rhythmical sucking.  Audible Swallowing: A few with stimulation  Type of Nipple: Everted at rest and after stimulation  Comfort (Breast/Nipple): Soft / non-tender  Hold (Positioning): No assistance needed to correctly position infant at breast.  LATCH Score: 9  Interventions Interventions: Hand express;Hand pump  Lactation Tools Discussed/Used     Consult Status Consult Status: Follow-up Date: 07/22/17 Follow-up type: In-patient    Dahlia Byes [facility] 07/21/2017, 7:26 PM

## 2017-07-21 NOTE — Discharge Summary (Signed)
OB Discharge Summary     Patient Name: Jacqueline Irwin DOB: 1991-01-09 MRN: [id number]  Date of admission: 07/19/2017 Delivering MD: KEY, Jearld Lesch   Date of discharge: 07/21/2017  Admitting diagnosis: 38WKS,CTX Intrauterine pregnancy: [redacted]w[redacted]d     Secondary diagnosis:  Active Problems:   Normal labor   SVD (spontaneous vaginal delivery)  Additional problems:  Patient Active Problem List   Diagnosis Date Noted  . SVD (spontaneous vaginal delivery) 07/21/2017  . Normal labor 07/19/2017  . Abnormal thyroid stimulating hormone (TSH) level 02/12/2017  . Maternal varicella, non-immune 02/07/2017  . Non-English speaking patient 02/06/2017  . Lichen sclerosus 02/06/2017  . 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 Hx of preeclampsia, prior pregnancy, currently pregnant 02/06/2017  . Supervision of normal pregnancy, antepartum 02/05/2017       Discharge diagnosis: Term Pregnancy Delivered                                                                                                Post partum procedures:none  Augmentation: Pitocin and Cytotec  Complications: None  Hospital course:  Onset of Labor With Vaginal Delivery     26 y.o. yo [id number] at [redacted]w[redacted]d was admitted in Latent Labor on 07/19/2017. Patient had an uncomplicated labor course as follows:  Membrane Rupture Time/Date: 3:30 AM ,07/19/2017   Intrapartum Procedures: Episiotomy: None [1]                                         Lacerations:  1st degree [2]  Patient had a delivery of a Viable infant. 07/20/2017  Information for the patient's newborn:  Nicolasa, Milbrath [[id number]]  Delivery Method: Vaginal, Spontaneous Delivery (Filed from Delivery Summary)    Pateint had an uncomplicated postpartum course.  She is ambulating, tolerating a regular diet, passing flatus, and urinating well. Patient is discharged home in stable condition on 07/21/17.   Physical exam  Vitals:   07/20/17 0815 07/20/17 1215 07/20/17 2012  07/21/17 0536  BP: 106/73 119/74 117/83 (!) 103/58  Pulse: 82 78 71 74  Resp: Temp: 98.9 F (37.2 C) 98 F (36.7 C) 98.7 F (37.1 C) 98.9 F (37.2 C)  TempSrc: Oral Oral Oral Oral  SpO2: 100% 100% 100%   Weight:      Height:       General: alert, cooperative and no distress Lochia: appropriate Uterine Fundus: firm Incision: N/A DVT Evaluation: No evidence of DVT seen on physical exam. Labs: Lab Results  Component Value Date   WBC 9.6 07/19/2017   HGB 11.5 (L) 07/19/2017   HCT 33.7 (L) 07/19/2017   MCV 82.6 07/19/2017   PLT 171 07/19/2017   No flowsheet data found.  Discharge instruction: per After Visit Summary and "Baby and Me Booklet".  After visit meds:  Allergies as of 07/21/2017   No Known Allergies     Medication List    TAKE these medications   ibuprofen 600 MG tablet Commonly known as:  ADVIL,MOTRIN Take  1 tablet (600 mg total) by mouth every 6 (six) hours.   omeprazole 20 MG capsule Commonly known as:  PRILOSEC Take 1 capsule (20 mg total) by mouth 2 (two) times daily before a meal.   PROVIDA DHA 16-16-1.25-110 MG Caps Take 1 tablet by mouth daily.   senna-docusate 8.6-50 MG tablet Commonly known as:  Senokot-S Take 2 tablets by mouth daily.            Discharge Care Instructions        Start     Ordered   07/22/17 0000  senna-docusate (SENOKOT-S) 8.6-50 MG tablet  Every 24 hours     07/21/17 0905   07/21/17 0000  ibuprofen (ADVIL,MOTRIN) 600 MG tablet  Every 6 hours     07/21/17 0905   07/19/17 0000  OB RESULT CONSOLE Group B Strep    Comments:  This external order was created through the Results Console.   07/19/17 1834      Diet: routine diet  Activity: Advance as tolerated. Pelvic rest for 6 weeks.   Outpatient follow up:6 weeks Follow up Appt:Future Appointments Date Time Provider Department Center  07/25/2017 9:00 AM Roe Coombs, CNM [facility] None   Follow up Visit: Follow-up Information    [facility] [place]. Schedule an appointment as soon as possible for a visit.   Specialty:  Obstetrics and Gynecology Why:  For postpartum visit in 4-6 weeks Contact information: [address] [place] [address] [phone]          Postpartum contraception: Nuvaring  Newborn Data: Live born female  Birth Weight: 6 lb 1.7 oz (2770 g) APGAR: 9, 9  Baby Feeding: Breast Disposition:home with mother   Caryl Ada, DO [place] Fellow

## 2017-07-22 NOTE — Plan of Care (Signed)
Problem: Life Cycle: Goal: Risk for postpartum hemorrhage will decrease Ipad interpreter "Husam" # [id number] used to introduce self to patient, order breakfast, explain morning assessments and answer morning questions.

## 2017-07-22 NOTE — Plan of Care (Signed)
Problem: Education: Goal: Knowledge of condition will improve Discharge education, safety and follow up reviewed with patient using Ipad interpreter "Zina" [phone]. Mother verbalized understanding of information.

## 2017-07-22 NOTE — Lactation Note (Signed)
This note was copied from a baby's chart. Lactation Consultation Note  Patient Name: Jacqueline Irwin RUEAV'W Date: 07/22/2017 Reason for consult: Follow-up assessment  With this first time mom and term but small baby, now 50 hours old, and weight 5 lbs 9.8 oz and at 8% weight loss. The baby has a more tan adequate output,. I spoke to faculty MD Dr. Manson Passey, and told her I felt this mom and baby would do fine at home. I showed mom how to use manual hand pump, and she was also shown how to and express. Mom easily expressed about 10 ml's of milk, which mom will feed to baby, as tolerated, after BF. I advised mom to feed at least every 3 hours, followed by pumping, and then supplement with EBm.  I used   Video interpreter for arabic language, Hasam, 140030 I will try and observe a breast feeding before mom and baby go home today.     Maternal Data    Feeding    LATCH Score Latch: Too sleepy or reluctant, no latch achieved, no sucking elicited.     Type of Nipple: Everted at rest and after stimulation  Comfort (Breast/Nipple): Filling, red/small blisters or bruises, mild/mod discomfort (filling, milk transitioning in)  Hold (Positioning): Assistance needed to correctly position infant at breast and maintain latch.     Interventions Interventions: Breast feeding basics reviewed;Assisted with latch;Skin to skin;Hand express;Adjust position;Support pillows;Position options;Expressed milk;Hand pump  Lactation Tools Discussed/Used WIC Program: Yes (fax sent for mom to be called) Pump Review: Setup, frequency, and cleaning;Milk Storage;Other (comment) (hand expression taught) Initiated by:: Kyrielle Urbanski Conley Rolls, Rn IBCLC Date initiated:: 07/22/17   Consult Status Consult Status: Complete Follow-up type: Call as needed    Jacqueline Irwin 07/22/2017, 9:41 AM

## 2017-07-22 NOTE — Discharge Summary (Signed)
OB Discharge Summary     Patient Name: Jacqueline Irwin DOB: 1991-04-16 MRN: [id number]  Date of admission: 07/19/2017 Delivering MD: KEY, Jearld Lesch   Date of discharge: 07/22/2017  Admitting diagnosis: 38WKS,CTX Intrauterine pregnancy: [redacted]w[redacted]d     Secondary diagnosis:  Active Problems:   Normal labor   SVD (spontaneous vaginal delivery)  Additional problems:  Patient Active Problem List   Diagnosis Date Noted  . SVD (spontaneous vaginal delivery) 07/21/2017  . Normal labor 07/19/2017  . Abnormal thyroid stimulating hormone (TSH) level 02/12/2017  . Maternal varicella, non-immune 02/07/2017  . Non-English speaking patient 02/06/2017  . Lichen sclerosus 02/06/2017  . 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 Hx of preeclampsia, prior pregnancy, currently pregnant 02/06/2017  . Supervision of normal pregnancy, antepartum 02/05/2017       Discharge diagnosis: Term Pregnancy Delivered                                                                                                Post partum procedures:none  Augmentation: Pitocin and Cytotec  Complications: None  Hospital course:  Onset of Labor With Vaginal Delivery     26 y.o. yo [id number] at [redacted]w[redacted]d was admitted in Latent Labor on 07/19/2017. Patient had an uncomplicated labor course as follows:  Membrane Rupture Time/Date: 3:30 AM ,07/19/2017   Intrapartum Procedures: Episiotomy: None [1]                                         Lacerations:  1st degree [2]  Patient had a delivery of a Viable infant. 07/20/2017  Information for the patient's newborn:  Angela, Vazguez [[id number]]  Delivery Method: Vaginal, Spontaneous Delivery (Filed from Delivery Summary)    Pateint had an uncomplicated postpartum course.  She is ambulating, tolerating a regular diet, passing flatus, and urinating well. Patient is discharged home in stable condition on 07/22/17.   Physical exam  Vitals:   07/20/17 2012 07/21/17 0536 07/21/17 1829  07/22/17 0520  BP: 117/83 (!) 103/58 125/83 111/69  Pulse: 71 74 76 67  Resp: Temp: 98.7 F (37.1 C) 98.9 F (37.2 C) 98.9 F (37.2 C) 98.5 F (36.9 C)  TempSrc: Oral Oral Oral Oral  SpO2: 100%  100%   Weight:      Height:       General: alert, cooperative and no distress Lochia: appropriate Uterine Fundus: firm Incision: N/A DVT Evaluation: No evidence of DVT seen on physical exam. Labs: Lab Results  Component Value Date   WBC 9.6 07/19/2017   HGB 11.5 (L) 07/19/2017   HCT 33.7 (L) 07/19/2017   MCV 82.6 07/19/2017   PLT 171 07/19/2017   No flowsheet data found.  Discharge instruction: per After Visit Summary and "Baby and Me Booklet".  After visit meds:  Allergies as of 07/22/2017   No Known Allergies     Medication List    TAKE these medications   ibuprofen 600 MG tablet Commonly known as:  ADVIL,MOTRIN Take  1 tablet (600 mg total) by mouth every 6 (six) hours.   omeprazole 20 MG capsule Commonly known as:  PRILOSEC Take 1 capsule (20 mg total) by mouth 2 (two) times daily before a meal.   PROVIDA DHA 16-16-1.25-110 MG Caps Take 1 tablet by mouth daily.   senna-docusate 8.6-50 MG tablet Commonly known as:  Senokot-S Take 2 tablets by mouth daily.            Discharge Care Instructions        Start     Ordered   07/22/17 0000  senna-docusate (SENOKOT-S) 8.6-50 MG tablet  Every 24 hours     07/21/17 0905   07/21/17 0000  ibuprofen (ADVIL,MOTRIN) 600 MG tablet  Every 6 hours     07/21/17 0905   07/19/17 0000  OB RESULT CONSOLE Group B Strep    Comments:  This external order was created through the Results Console.   07/19/17 1834      Diet: routine diet  Activity: Advance as tolerated. Pelvic rest for 6 weeks.   Outpatient follow up:6 weeks Follow up Appt: Future Appointments Date Time Provider Department Center  08/19/2017 8:15 AM Roe Coombs, CNM [facility] None   Follow up Visit: Follow-up Information    [facility] [place]. Schedule an appointment as soon as possible for a visit.   Specialty:  Obstetrics and Gynecology Why:  For postpartum visit in 4-6 weeks Contact information: [address] [place] [address] [phone]          Postpartum contraception: Nuvaring  Newborn Data: Live born female  Birth Weight: 6 lb 1.7 oz (2770 g) APGAR: 9, 9  Baby Feeding: Breast Disposition:home with mother   Caryl Ada, DO [place] Fellow

## 2017-07-25 ENCOUNTER — Encounter: Payer: Medicaid Other | Admitting: Certified Nurse Midwife

## 2017-08-19 ENCOUNTER — Encounter: Payer: Self-pay | Admitting: Certified Nurse Midwife

## 2017-08-19 ENCOUNTER — Ambulatory Visit (INDEPENDENT_AMBULATORY_CARE_PROVIDER_SITE_OTHER): Payer: Medicaid Other | Admitting: Certified Nurse Midwife

## 2017-08-19 DIAGNOSIS — Z30011 Encounter for initial prescription of contraceptive pills: Secondary | ICD-10-CM

## 2017-08-19 DIAGNOSIS — Z789 Other specified health status: Secondary | ICD-10-CM

## 2017-08-19 MED ORDER — NORETHINDRONE 0.35 MG PO TABS: 1.0000 | ORAL_TABLET | Freq: Every day | ORAL | 11 refills | Status: DC

## 2017-08-19 NOTE — Progress Notes (Signed)
Patient is interested in discussing [facility] options.

## 2017-08-19 NOTE — Progress Notes (Signed)
..  Post Partum Exam  Jacqueline Irwin is a 26 y.o. [phone] female who presents for a postpartum visit. She is 4 weeks postpartum following a spontaneous vaginal delivery. I have fully reviewed the prenatal and intrapartum course. The delivery was at 38.3 gestational weeks.  Anesthesia: epidural. Postpartum course has been good. Baby's course has been good. Baby is feeding by breast. Bleeding no bleeding. Bowel function is normal. Bladder function is normal. Patient is not sexually active. Contraception method is none. Postpartum depression screening:neg.  Reports vaginal pain where stiches are since delivery with occasional mild itching.    The following portions of the patient's history were reviewed and updated as appropriate: allergies, current medications, past family history, past medical history, past social history, past surgical history and problem list.  Review of Systems Pertinent items noted in HPI and remainder of comprehensive ROS otherwise negative.    Objective:  Weight 171 lb (77.6 kg), unknown if currently breastfeeding.  General:  alert, cooperative and no distress   Breasts:  inspection negative, no nipple discharge or bleeding, no masses or nodularity palpable  Lungs: clear to auscultation bilaterally  Heart:  regular rate and rhythm, S1, S2 normal, no murmur, click, rub or gallop  Abdomen: soft, non-tender; bowel sounds normal; no masses,  no organomegaly   Vulva:  normal  Vagina: normal vagina, no discharge, exudate, lesion, or erythema  Cervix:  no cervical motion tenderness  Corpus: normal size, contour, position, consistency, mobility, non-tender  Adnexa:  normal adnexa  Rectal Exam: Not performed.       Pap smear: 02/05/17: normal  Assessment:    Normal 4 week postpartum exam. Pap smear not done at today's visit.   Plan:   1. Contraception: abstinence and oral progesterone-only contraceptive 2. Sitz baths for perineal healing encouraged.   3. Follow up in:  6 months for annual exam or as needed.  '

## 2017-08-26 ENCOUNTER — Encounter (HOSPITAL_COMMUNITY): Payer: Self-pay

## 2017-08-26 DIAGNOSIS — Y929 Unspecified place or not applicable: Secondary | ICD-10-CM | POA: Insufficient documentation

## 2017-08-26 DIAGNOSIS — S31802A Laceration with foreign body of unspecified buttock, initial encounter: Secondary | ICD-10-CM | POA: Insufficient documentation

## 2017-08-26 DIAGNOSIS — W25XXXA Contact with sharp glass, initial encounter: Secondary | ICD-10-CM | POA: Insufficient documentation

## 2017-08-26 DIAGNOSIS — Y939 Activity, unspecified: Secondary | ICD-10-CM | POA: Diagnosis not present

## 2017-08-26 DIAGNOSIS — Y998 Other external cause status: Secondary | ICD-10-CM | POA: Diagnosis not present

## 2017-08-26 NOTE — ED Triage Notes (Signed)
Pt arrives from home with spouse; pt speaks Arabic and translater pad used; pt states she tripped on rug at home falling on glass table; pt states table broke and injured her; pt has lac to inside of butt cheeks; bleeding controlled; pt unable to sit or ly on bottom; Pt left in triage and lying on side.

## 2017-08-27 ENCOUNTER — Emergency Department (HOSPITAL_COMMUNITY): Payer: Medicaid Other

## 2017-08-27 ENCOUNTER — Emergency Department (HOSPITAL_COMMUNITY)
Admission: EM | Admit: 2017-08-27 | Discharge: 2017-08-27 | Disposition: A | Discharge status: Home / Self Care | Payer: Medicaid Other | Attending: Emergency Medicine | Admitting: Emergency Medicine

## 2017-08-27 DIAGNOSIS — W19XXXA Unspecified fall, initial encounter: Secondary | ICD-10-CM

## 2017-08-27 DIAGNOSIS — S31802A Laceration with foreign body of unspecified buttock, initial encounter: Secondary | ICD-10-CM

## 2017-08-27 MED ORDER — LIDOCAINE-EPINEPHRINE (PF) 2 %-1:200000 IJ SOLN
20.0000 mL | Freq: Once | INTRAMUSCULAR | Status: AC
  Administered 2017-08-27: 20 mL
  Filled 2017-08-27: qty 20

## 2017-08-27 MED ORDER — AMOXICILLIN-POT CLAVULANATE 875-125 MG PO TABS: 1.0000 | ORAL_TABLET | Freq: Two times a day (BID) | ORAL | 0 refills | Status: DC

## 2017-08-27 MED ORDER — BACITRACIN 500 UNIT/GM EX OINT: 1.0000 "application " | TOPICAL_OINTMENT | Freq: Two times a day (BID) | CUTANEOUS | 0 refills | Status: DC

## 2017-08-27 MED ORDER — BACITRACIN ZINC 500 UNIT/GM EX OINT
TOPICAL_OINTMENT | Freq: Once | CUTANEOUS | Status: AC
  Administered 2017-08-27: 1 via TOPICAL
  Filled 2017-08-27: qty 0.9

## 2017-08-27 MED ORDER — OXYCODONE-ACETAMINOPHEN 5-325 MG PO TABS
1.0000 | ORAL_TABLET | Freq: Once | ORAL | Status: AC
  Administered 2017-08-27: 1 via ORAL
  Filled 2017-08-27: qty 1

## 2017-08-27 NOTE — ED Provider Notes (Signed)
[facility] EMERGENCY DEPARTMENT Provider Note   CSN: [id number] Arrival date & time: 08/26/17  2154     History   Chief Complaint Chief Complaint  Patient presents with  . Fall  . Rectal Pain  . Laceration    Lac in between butt cheeks    HPI  Jacqueline Irwin is a 26 y.o. female printed past medical history, who presents with a laceration to the buttocks or patient reports she tripped on a rug and fell onto a glass table causing it to break and injure her. She reports this happened at approximately 9 PM, bleeding has been controlled, but pain has been constant, and she is unable to sit or lay down on the area. Patient has not taken anything for the pain prior to arrival. She reports she had her tetanus vaccine updated in September of this year. Denies any pain or injury elsewhere, no back pain, didn't twist her ankle when she tripped.  Patient speaks Arabic, [employer] interpreter services used.      Past Medical History:  Diagnosis Date  . Medical history non-contributory     Patient Active Problem List   Diagnosis Date Noted  . Abnormal thyroid stimulating hormone (TSH) level 02/12/2017  . Maternal varicella, non-immune 02/07/2017  . Non-English speaking patient 02/06/2017  . Lichen sclerosus 02/06/2017  . 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 Supervision of normal pregnancy, antepartum 02/05/2017    Past Surgical History:  Procedure Laterality Date  . abcess of vulva    . APPENDECTOMY      OB History    Gravida Para Term Preterm AB Living   3 2 1 1 1 1    SAB TAB Ectopic Multiple Live Births   1     0 1       Home Medications    Prior to Admission medications   Medication Sig Start Date End Date Taking? Authorizing Provider  folic acid (FOLVITE) 1 MG tablet Take 1 mg by mouth daily.    [provider]  norethindrone (MICRONOR,CAMILA,ERRIN) 0.35 MG tablet Take 1 tablet (0.35 mg total) by mouth daily. 08/19/17    Denney, Boykin Reaper A, CNM  Prenat-FeFum-FePo-FA-DHA w/o A (PROVIDA DHA) 16-16-1.25-110 MG CAPS Take 1 tablet by mouth daily. 05/29/17   Roe Coombs, CNM    Family History Family History  Problem Relation Age of Onset  . Diabetes Mother   . Diabetes Paternal Grandmother   . Hypertension Paternal Grandmother     Social History Social History  Substance Use Topics  . Smoking status: Never Smoker  . Smokeless tobacco: Never Used  . Alcohol use No     Allergies   Patient has no known allergies.   Review of Systems Review of Systems  Constitutional: Negative for chills and fever.  Gastrointestinal: Positive for rectal pain. Negative for abdominal pain.  Musculoskeletal: Negative for arthralgias and myalgias.  Skin: Positive for wound.  Neurological: Negative for dizziness, light-headedness and numbness.     Physical Exam Updated Vital Signs BP 122/70 (BP Location: Left Arm)   Pulse 92   Temp 97.9 F (36.6 C) (Oral)   Resp 18   LMP 08/21/2017 (Exact Date)   SpO2 100%   Physical Exam  Constitutional: She appears well-developed and well-nourished. No distress.  HENT:  Head: Normocephalic and atraumatic.  Eyes: Right eye exhibits no discharge. Left eye exhibits no discharge.  Pulmonary/Chest: Effort normal. No respiratory distress.  Abdominal: Soft. Bowel sounds are normal. She exhibits  no distension. There is no tenderness. There is no guarding.  Neurological: She is alert. Coordination normal.  Skin: Skin is warm and dry. Capillary refill takes less than 2 seconds. She is not diaphoretic.  5 cm Horizontal laceration across both buttocks about 2 cm below the top of the gluteal cleft, with more shallow abrasions extending on each side, few small pieces of glass noted in wound, no involvement of the sphincter  Psychiatric: She has a normal mood and affect. Her behavior is normal.  Nursing note and vitals reviewed.    ED Treatments / Results  Labs (all labs  ordered are listed, but only abnormal results are displayed) Labs Reviewed - No data to display  EKG  EKG Interpretation None       Radiology Dg Pelvis 1-2 Views  Result Date: 08/27/2017 CLINICAL DATA:  Larey SeatFell on glass table. Lacerations. Evaluate for glass foreign body . EXAM: PELVIS - 1-2 VIEW COMPARISON:  No recent prior. FINDINGS: Soft tissue structures are unremarkable. No radiopaque foreign body is identified. CT can be obtained to further evaluate if clinically indicated. No acute bony abnormality identified . IMPRESSION: No radiopaque foreign body identified.  No acute bony abnormality. Electronically Signed   By: Maisie Fushomas  Register   On: 08/27/2017 07:34    Procedures .Marland Kitchen.Laceration Repair Date/Time: 08/27/2017 8:53 AM Performed by: Dartha LodgeFORD, Aishia Barkey N Authorized by: Dartha LodgeFORD, Gibbs Naugle N   Consent:    Consent obtained:  Verbal   Consent given by:  Patient   Risks discussed:  Infection, pain, need for additional repair and retained foreign body Anesthesia (see MAR for exact dosages):    Anesthesia method:  Local infiltration   Local anesthetic:  Lidocaine 2% WITH epi Laceration details:    Location:  Anogenital   Anogenital location: Bilateral buttocks just distal to top of gluteal cleft.   Length (cm):  6 Repair type:    Repair type:  Complex Pre-procedure details:    Preparation:  Imaging obtained to evaluate for foreign bodies Exploration:    Hemostasis achieved with:  Epinephrine and direct pressure   Wound exploration: entire depth of wound probed and visualized     Wound extent: foreign bodies/material   Treatment:    Area cleansed with:  Betadine and saline   Amount of cleaning:  Extensive   Irrigation solution:  Sterile saline   Irrigation volume:  500   Visualized foreign bodies/material removed: yes   Skin repair:    Repair method:  Sutures   Suture size:  3-0   Suture material:  Nylon   Suture technique: 2 horizontal mattress sutures, and 6 simple interrupted.    Number of sutures:  8 Approximation:    Approximation:  Close Post-procedure details:    Dressing:  Antibiotic ointment and non-adherent dressing   Patient tolerance of procedure:  Tolerated well, no immediate complications    (including critical care time)  Medications Ordered in ED Medications  lidocaine-EPINEPHrine (XYLOCAINE W/EPI) 2 %-1:200000 (PF) injection 20 mL (20 mLs Infiltration Given 08/27/17 0756)  oxyCODONE-acetaminophen (PERCOCET/ROXICET) 5-325 MG per tablet 1 tablet (1 tablet Oral Given 08/27/17 0934)  bacitracin ointment (1 application Topical Given 08/27/17 0936)     Initial Impression / Assessment and Plan / ED Course  I have reviewed the triage vital signs and the nursing notes.  Pertinent labs & imaging results that were available during my care of the patient were reviewed by me and considered in my medical decision making (see chart for details).  Patient presents with  laceration across buttocks after falling onto a glass table.  Bleeding is controlled, vitals normal and patient is otherwise well-appearing.  X-ray shows no evidence of fracture and no radiopaque foreign bodies identified.  The area was numbed, and several small pieces of glass were removed, the wound was irrigated extensively and then closed with horizontal mattress sutures to relieve tension and simple interrupted sutures to approximate skin.  Patient tolerated the procedure well with no immediate complications.  Given location of laceration patient will be placed on antibiotics, as well as antibiotic ointment.  Instructed patient that she needs to be seen in 4-5 days for a wound check, and in 10-14 days to have sutures removed, discussed signs of infection and return precautions, provided patient resources for establishing primary care, interpreter used to explain all discharge instructions.  Patient expresses understanding and is in agreement with plan.  Patient discussed with Dr. Donnald Garre, who saw  patient as well and agrees with plan.  Final Clinical Impressions(s) / ED Diagnoses   Final diagnoses:  Laceration of buttock with foreign body, unspecified laterality, initial encounter  Fall, initial encounter    New Prescriptions Discharge Medication List as of 08/27/2017  9:34 AM    START taking these medications   Details  amoxicillin-clavulanate (AUGMENTIN) 875-125 MG tablet Take 1 tablet by mouth 2 (two) times daily. One po bid x 7 days, Starting Tue 08/27/2017, Print    bacitracin 500 UNIT/GM ointment Apply 1 application topically 2 (two) times daily., Starting Tue 08/27/2017, Print         Dartha Lodge, PA-C 08/27/17 1718    Arby Barrette, MD 08/31/17 [phone]

## 2017-08-27 NOTE — ED Notes (Signed)
PA at bedside using interpreter to go over discharge instructions. Pt has no questions, voices understanding.

## 2017-08-27 NOTE — Discharge Instructions (Signed)
Please complete full course of antibiotics, you may use Tylenol or ibuprofen for pain. Keep Clean and dry and apply antibiotic ointment twice daily. Please have wound rechecked in the next 4-5 days, and you will need to have your stitches removed in the next 10-14 days. If you develop fevers, redness or warmth from the area or drainage you will need to be seen sooner. Please see his phone number provided to establish care with a primary doctor.

## 2017-09-02 ENCOUNTER — Ambulatory Visit: Payer: Self-pay | Admitting: Physician Assistant

## 2017-09-15 ENCOUNTER — Other Ambulatory Visit: Payer: Self-pay | Admitting: Certified Nurse Midwife

## 2017-09-15 DIAGNOSIS — B3731 Acute candidiasis of vulva and vagina: Secondary | ICD-10-CM

## 2017-09-15 DIAGNOSIS — B373 Candidiasis of vulva and vagina: Secondary | ICD-10-CM

## 2017-11-05 NOTE — L&D Delivery Note (Signed)
IOL w/cytotec only. Had SROM and then progressed steadily through labor to C/C/+3.  2 push 2nd stage.   Delivery Note At 2:23 AM a viable female was delivered via Vaginal, Spontaneous (Presentation: ROA).  APGAR: 8, 9; weight pending. After 1 minute, the cord was clamped and cut. 40 units of pitocin diluted in 1000cc LR was infused rapidly IV.  The placenta separated spontaneously and delivered via CCT and maternal pushing effort.  It was inspected and appears to be intact with a 3 VC. Marland Kitchen.     Anesthesia:  epidural Episiotomy: None Lacerations: 1st degree;Perineal Suture Repair: 3.0 vicryl Est. Blood Loss (mL):  233  Mom to postpartum.  Baby to Couplet care / Skin to Skin.  The above was performed by Dr. Jamelle Rushinghelsey Anderson under my direct supervision and guidance.    Jacqueline Irwin 10/10/2018, 2:35 AM

## 2018-02-04 ENCOUNTER — Encounter: Payer: Self-pay | Admitting: Physician Assistant

## 2018-04-22 ENCOUNTER — Encounter: Payer: Self-pay | Admitting: Certified Nurse Midwife

## 2018-05-01 ENCOUNTER — Encounter: Payer: Self-pay | Admitting: Certified Nurse Midwife

## 2018-05-01 ENCOUNTER — Other Ambulatory Visit (HOSPITAL_COMMUNITY)
Admission: RE | Admit: 2018-05-01 | Discharge: 2018-05-01 | Disposition: A | Discharge status: Home / Self Care | Payer: Medicaid Other | Source: Ambulatory Visit | Attending: Certified Nurse Midwife | Admitting: Certified Nurse Midwife

## 2018-05-01 ENCOUNTER — Ambulatory Visit (INDEPENDENT_AMBULATORY_CARE_PROVIDER_SITE_OTHER): Payer: Medicaid Other | Admitting: Certified Nurse Midwife

## 2018-05-01 VITALS — BP 115/73 | HR 105 | Wt 159.2 lb

## 2018-05-01 DIAGNOSIS — O09212 Supervision of pregnancy with history of pre-term labor, second trimester: Secondary | ICD-10-CM

## 2018-05-01 DIAGNOSIS — O0990 Supervision of high risk pregnancy, unspecified, unspecified trimester: Secondary | ICD-10-CM | POA: Diagnosis present

## 2018-05-01 DIAGNOSIS — K219 Gastro-esophageal reflux disease without esophagitis: Secondary | ICD-10-CM

## 2018-05-01 DIAGNOSIS — O99619 Diseases of the digestive system complicating pregnancy, unspecified trimester: Secondary | ICD-10-CM

## 2018-05-01 DIAGNOSIS — O0992 Supervision of high risk pregnancy, unspecified, second trimester: Secondary | ICD-10-CM

## 2018-05-01 DIAGNOSIS — O219 Vomiting of pregnancy, unspecified: Secondary | ICD-10-CM

## 2018-05-01 DIAGNOSIS — Z789 Other specified health status: Secondary | ICD-10-CM

## 2018-05-01 DIAGNOSIS — L900 Lichen sclerosus et atrophicus: Secondary | ICD-10-CM

## 2018-05-01 DIAGNOSIS — Z8751 Personal history of pre-term labor: Secondary | ICD-10-CM | POA: Insufficient documentation

## 2018-05-01 DIAGNOSIS — B373 Candidiasis of vulva and vagina: Secondary | ICD-10-CM

## 2018-05-01 DIAGNOSIS — B3731 Acute candidiasis of vulva and vagina: Secondary | ICD-10-CM

## 2018-05-01 DIAGNOSIS — O99612 Diseases of the digestive system complicating pregnancy, second trimester: Secondary | ICD-10-CM

## 2018-05-01 DIAGNOSIS — N90810 Female genital mutilation status, unspecified: Secondary | ICD-10-CM

## 2018-05-01 MED ORDER — DOXYLAMINE-PYRIDOXINE 10-10 MG PO TBEC: DELAYED_RELEASE_TABLET | ORAL | 4 refills | Status: DC

## 2018-05-01 MED ORDER — TERCONAZOLE 0.8 % VA CREA: 1.0000 | TOPICAL_CREAM | Freq: Every day | VAGINAL | 0 refills | Status: DC

## 2018-05-01 MED ORDER — FLUCONAZOLE 150 MG PO TABS: 150.0000 mg | ORAL_TABLET | Freq: Once | ORAL | 0 refills | Status: AC

## 2018-05-01 MED ORDER — VITAFOL-NANO 18-0.6-0.4 MG PO TABS: 1.0000 | ORAL_TABLET | Freq: Every day | ORAL | 12 refills | Status: DC

## 2018-05-01 MED ORDER — ASPIRIN 81 MG PO CHEW: 81.0000 mg | CHEWABLE_TABLET | Freq: Every day | ORAL | 12 refills | Status: DC

## 2018-05-01 MED ORDER — CLOBETASOL PROPIONATE 0.05 % EX OINT: 1.0000 "application " | TOPICAL_OINTMENT | Freq: Two times a day (BID) | CUTANEOUS | 0 refills | Status: DC

## 2018-05-01 MED ORDER — OMEPRAZOLE 20 MG PO CPDR: 20.0000 mg | DELAYED_RELEASE_CAPSULE | Freq: Two times a day (BID) | ORAL | 5 refills | Status: DC

## 2018-05-01 NOTE — Progress Notes (Signed)
Subjective:   Jacqueline Irwin is a 27 y.o. [id number] at 496w2d by LMP being seen today for her first obstetrical visit.  Her obstetrical history is significant for obesity and pre-eclampsia.?Stillbirth at 36 weeks.   Patient does intend to breast feed. Pregnancy history fully reviewed.  Patient reports nausea, no bleeding, no contractions, no cramping, no leaking and vomiting.  HISTORY: OB History  Gravida Para Term Preterm AB Living  4 2 1 1 1 1   SAB TAB Ectopic Multiple Live Births  1 0 0 0 1    # Outcome Date GA Lbr Len/2nd Weight Sex Delivery Anes PTL Lv  4 Current           3 Term 07/20/17 5335w3d / 02:45 6 lb 1.7 oz (2.77 kg) M Vag-Spont EPI  LIV     Name: Cristine PolioOREEN,BOY Rayni     Apgar1: 9  Apgar5: 9  2 Preterm 06/07/16 7153w0d       FD     Complications: PIH (pregnancy induced hypertension)  1 SAB             Last pap smear was done 02/05/17 and was normal  Past Medical History:  Diagnosis Date  . Medical history non-contributory    Past Surgical History:  Procedure Laterality Date  . abcess of vulva    . APPENDECTOMY     Family History  Problem Relation Age of Onset  . Diabetes Mother   . Diabetes Paternal Grandmother   . Hypertension Paternal Grandmother    Social History   Tobacco Use  . Smoking status: Never Smoker  . Smokeless tobacco: Never Used  Substance Use Topics  . Alcohol use: No  . Drug use: No   No Known Allergies Current Outpatient Medications on File Prior to Visit  Medication Sig Dispense Refill  . Prenat-FeFum-FePo-FA-DHA w/o A (PROVIDA DHA) 16-16-1.25-110 MG CAPS Take 1 tablet by mouth daily. 30 capsule 12  . amoxicillin-clavulanate (AUGMENTIN) 875-125 MG tablet Take 1 tablet by mouth 2 (two) times daily. One po bid x 7 days (Patient not taking: Reported on 05/01/2018) 14 tablet 0  . bacitracin 500 UNIT/GM ointment Apply 1 application topically 2 (two) times daily. (Patient not taking: Reported on 05/01/2018) 15 g 0  . folic acid (FOLVITE) 1  MG tablet Take 1 mg by mouth daily.    . norethindrone (MICRONOR,CAMILA,ERRIN) 0.35 MG tablet Take 1 tablet (0.35 mg total) by mouth daily. (Patient not taking: Reported on 05/01/2018) 1 Package 11   No current facility-administered medications on file prior to visit.     Review of Systems Pertinent items noted in HPI and remainder of comprehensive ROS otherwise negative.  Exam   Vitals:   05/01/18 0856  BP: 115/73  Pulse: (!) 105  Weight: 159 lb 3.2 oz (72.2 kg)      Uterus:     Pelvic Exam: Perineum: no hemorrhoids, normal perineum   Vulva: normal external genitalia, no lesions   Vagina:  normal mucosa, normal discharge   Cervix: no lesions and normal, pap smear done.    Adnexa: normal adnexa and no mass, fullness, tenderness   Bony Pelvis: average  System: General: well-developed, well-nourished female in no acute distress   Breast:  normal appearance, no masses or tenderness   Skin: normal coloration and turgor, no rashes   Neurologic: oriented, normal, negative, normal mood   Extremities: normal strength, tone, and muscle mass, ROM of all joints is normal   HEENT PERRLA, extraocular  movement intact and sclera clear, anicteric   Mouth/Teeth mucous membranes moist, pharynx normal without lesions and dental hygiene good   Neck supple and no masses   Cardiovascular: regular rate and rhythm   Respiratory:  no respiratory distress, normal breath sounds   Abdomen: soft, non-tender; bowel sounds normal; no masses,  no organomegaly     Assessment:   Pregnancy: [id number] Patient Active Problem List   Diagnosis Date Noted  . History of preterm delivery 05/01/2018  . Abnormal thyroid stimulating hormone (TSH) level 02/12/2017  . Maternal varicella, non-immune 02/07/2017  . Non-English speaking patient 02/06/2017  . Lichen sclerosus 02/06/2017  . 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 Supervision of high risk pregnancy, antepartum 02/05/2017     Plan:  1.  Supervision of high risk pregnancy, antepartum      - Obstetric Panel, Including HIV - Culture, OB Urine - GC/Chlamydia probe amp ([place])not at [facility] - Protein / creatinine ratio, urine - Genetic Screening - Comprehensive metabolic panel - Vitamin D (25 hydroxy) - TSH Pregnancy - Hemoglobin A1c - aspirin 81 MG chewable tablet; Chew 1 tablet (81 mg total) by mouth daily.  Dispense: 30 tablet; Refill: 12 - Enroll Patient in Bab[employer]cripts - [place] MFM OB DETAIL +14 WK; Future - Prenatal-Fe Fum-Methf-FA w/o A (VITAFOL-NANO) 18-0.6-0.4 MG TABS; Take 1 tablet by mouth daily.  Dispense: 30 tablet; Refill: 12  2. Non-English speaking patient     Interpreter present for exam.   3. History of preterm delivery     D/T Pre-eclampsia  4. Lichen sclerosus      - clobetasol ointment (TEMOVATE) 0.05 %; Apply 1 application topically 2 (two) times daily.  Dispense: 30 g; Refill: 0  5. 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 Yeast vaginitis     - fluconazole (DIFLUCAN) 150 MG tablet; Take 1 tablet (150 mg total) by mouth once for 1 dose.  Dispense: 1 tablet; Refill: 0 - terconazole (TERAZOL 3) 0.8 % vaginal cream; Place 1 applicator vaginally at bedtime.  Dispense: 20 g; Refill: 0  7. Nausea/vomiting in pregnancy     - Doxylamine-Pyridoxine (DICLEGIS) 10-10 MG TBEC; Take 1 tablet with breakfast and lunch.  Take 2 tablets at bedtime.  Dispense: 100 tablet; Refill: 4  8. Gastroesophageal reflux in pregnancy    Diet discussed.  - omeprazole (PRILOSEC) 20 MG capsule; Take 1 capsule (20 mg total) by mouth 2 (two) times daily before a meal.  Dispense: 60 capsule; Refill: 5   Initial labs drawn. Continue prenatal vitamins. Genetic Screening discussed, NIPS: ordered. Ultrasound discussed; fetal anatomic survey: ordered. Problem list reviewed and updated. The nature of [facility] with multiple MDs and other Advanced Practice Providers was explained to  patient; also emphasized that residents, students are part of our team. Routine obstetric precautions reviewed. Return in about 1 month (around 05/29/2018) for [facility], Female providers only.     Orvilla Cornwall, CNM [facility], [facility]

## 2018-05-02 LAB — OBSTETRIC PANEL, INCLUDING HIV
ANTIBODY SCREEN: NEGATIVE
Basophils Absolute: 0 10*3/uL (ref 0.0–0.2)
Basos: 0 %
EOS (ABSOLUTE): 0.2 10*3/uL (ref 0.0–0.4)
Eos: 2 %
HEP B S AG: NEGATIVE
HIV Screen 4th Generation wRfx: NONREACTIVE
Hematocrit: 36 % (ref 34.0–46.6)
Hemoglobin: 12.2 g/dL (ref 11.1–15.9)
Immature Grans (Abs): 0 10*3/uL (ref 0.0–0.1)
Immature Granulocytes: 0 %
LYMPHS ABS: 1.6 10*3/uL (ref 0.7–3.1)
Lymphs: 21 %
MCH: 28.8 pg (ref 26.6–33.0)
MCHC: 33.9 g/dL (ref 31.5–35.7)
MCV: 85 fL (ref 79–97)
MONOS ABS: 0.5 10*3/uL (ref 0.1–0.9)
Monocytes: 6 %
NEUTROS PCT: 71 %
Neutrophils Absolute: 5.5 10*3/uL (ref 1.4–7.0)
PLATELETS: 232 10*3/uL (ref 150–450)
RBC: 4.23 x10E6/uL (ref 3.77–5.28)
RDW: 14.6 % (ref 12.3–15.4)
RH TYPE: POSITIVE
RPR Ser Ql: NONREACTIVE
Rubella Antibodies, IGG: 18.1 index (ref >0.99)
WBC: 7.8 10*3/uL (ref 3.4–10.8)

## 2018-05-02 LAB — GC/CHLAMYDIA PROBE AMP (~~LOC~~) NOT AT ARMC
Chlamydia: NEGATIVE
Neisseria Gonorrhea: NEGATIVE

## 2018-05-02 LAB — COMPREHENSIVE METABOLIC PANEL
ALBUMIN: 3.7 g/dL (ref 3.5–5.5)
ALK PHOS: 97 IU/L (ref 39–117)
ALT: 15 IU/L (ref 0–32)
AST: 15 IU/L (ref 0–40)
Albumin/Globulin Ratio: 1.2 (ref 1.2–2.2)
BUN / CREAT RATIO: 13 (ref 9–23)
BUN: 6 mg/dL (ref 6–20)
Bilirubin Total: <0.2 mg/dL (ref 0.0–1.2)
CO2: 22 mmol/L (ref 20–29)
CREATININE: 0.48 mg/dL — AB (ref 0.57–1.00)
Calcium: 9.5 mg/dL (ref 8.7–10.2)
Chloride: 102 mmol/L (ref 96–106)
GFR calc Af Amer: 153 mL/min/{1.73_m2} (ref >59)
GFR, EST NON AFRICAN AMERICAN: 133 mL/min/{1.73_m2} (ref >59)
GLOBULIN, TOTAL: 3.2 g/dL (ref 1.5–4.5)
Glucose: 87 mg/dL (ref 65–99)
Potassium: 4 mmol/L (ref 3.5–5.2)
SODIUM: 138 mmol/L (ref 134–144)
Total Protein: 6.9 g/dL (ref 6.0–8.5)

## 2018-05-02 LAB — HEMOGLOBIN A1C
Est. average glucose Bld gHb Est-mCnc: 94 mg/dL
HEMOGLOBIN A1C: 4.9 % (ref 4.8–5.6)

## 2018-05-02 LAB — PROTEIN / CREATININE RATIO, URINE
CREATININE, UR: 144.9 mg/dL
Protein, Ur: 16.6 mg/dL
Protein/Creat Ratio: 115 mg/g creat (ref 0–200)

## 2018-05-02 LAB — TSH PREGNANCY: TSH Pregnancy: 1.08 u[IU]/mL (ref 0.450–4.500)

## 2018-05-02 LAB — VITAMIN D 25 HYDROXY (VIT D DEFICIENCY, FRACTURES): VIT D 25 HYDROXY: 11.8 ng/mL — AB (ref 30.0–100.0)

## 2018-05-06 LAB — OB RESULTS CONSOLE GBS: GBS: POSITIVE

## 2018-05-06 LAB — URINE CULTURE, OB REFLEX

## 2018-05-06 LAB — CULTURE, OB URINE

## 2018-05-07 ENCOUNTER — Encounter: Payer: Self-pay | Admitting: Certified Nurse Midwife

## 2018-05-13 ENCOUNTER — Other Ambulatory Visit: Payer: Self-pay | Admitting: Certified Nurse Midwife

## 2018-05-13 ENCOUNTER — Telehealth: Payer: Self-pay

## 2018-05-13 ENCOUNTER — Other Ambulatory Visit: Payer: Self-pay

## 2018-05-13 DIAGNOSIS — O2342 Unspecified infection of urinary tract in pregnancy, second trimester: Secondary | ICD-10-CM

## 2018-05-13 DIAGNOSIS — O0990 Supervision of high risk pregnancy, unspecified, unspecified trimester: Secondary | ICD-10-CM

## 2018-05-13 DIAGNOSIS — E559 Vitamin D deficiency, unspecified: Secondary | ICD-10-CM

## 2018-05-13 DIAGNOSIS — B951 Streptococcus, group B, as the cause of diseases classified elsewhere: Secondary | ICD-10-CM

## 2018-05-13 MED ORDER — VITAMIN D (ERGOCALCIFEROL) 1.25 MG (50000 UNIT) PO CAPS: 50000.0000 [IU] | ORAL_CAPSULE | ORAL | 2 refills | Status: DC

## 2018-05-13 MED ORDER — NITROFURANTOIN MONOHYD MACRO 100 MG PO CAPS: 100.0000 mg | ORAL_CAPSULE | Freq: Two times a day (BID) | ORAL | 0 refills | Status: DC

## 2018-05-13 NOTE — Telephone Encounter (Signed)
Patient was notified of results and Rx.  Hus[facility] [address]  Patient verbalized understanding.

## 2018-05-13 NOTE — Progress Notes (Signed)
[facility] [address]

## 2018-05-13 NOTE — Telephone Encounter (Deleted)
-----   Message from Rachelle A Denney, CNM sent at 05/13/2018 12:25 PM EDT ----- Please let her know that her vitamin D level is low.  Vitamin D weekly tablet has been sent to her pharmacy for her to take.  Thank you.  R.Denney CNM Please let her know that she has a UTI and macrobid has been sent in for her to take.  Please also tell her that if she has any fever, increased back pain with N&V or decreased urine output to let us know. Thank you.  . She will get antibiotics during labor due to GBS in her urine.    

## 2018-05-13 NOTE — Telephone Encounter (Signed)
-----   Message from Roe Coombsachelle A Denney, CNM sent at 05/13/2018 12:25 PM EDT ----- Please let her know that her vitamin D level is low.  Vitamin D weekly tablet has been sent to her pharmacy for her to take.  Thank you.  R.Denney CNM Please let her know that she has a UTI and macrobid has been sent in for her to take.  Please also tell her that if she has any fever, increased back pain with N&V or decreased urine output to let us know. Thank you.  Marland Kitchen. She will get antibiotics during labor due to GBS in her urine.

## 2018-05-13 NOTE — Progress Notes (Signed)
Patient was notified.

## 2018-05-15 ENCOUNTER — Encounter (HOSPITAL_COMMUNITY): Payer: Self-pay

## 2018-05-22 ENCOUNTER — Ambulatory Visit (HOSPITAL_COMMUNITY)
Admission: RE | Admit: 2018-05-22 | Discharge: 2018-05-22 | Disposition: A | Discharge status: Home / Self Care | Payer: Medicaid Other | Source: Ambulatory Visit | Attending: Certified Nurse Midwife | Admitting: Certified Nurse Midwife

## 2018-05-22 ENCOUNTER — Other Ambulatory Visit (HOSPITAL_COMMUNITY): Payer: Self-pay | Admitting: *Deleted

## 2018-05-22 ENCOUNTER — Other Ambulatory Visit: Payer: Self-pay | Admitting: Certified Nurse Midwife

## 2018-05-22 DIAGNOSIS — O09299 Supervision of pregnancy with other poor reproductive or obstetric history, unspecified trimester: Secondary | ICD-10-CM | POA: Insufficient documentation

## 2018-05-22 DIAGNOSIS — Z8759 Personal history of other complications of pregnancy, childbirth and the puerperium: Secondary | ICD-10-CM | POA: Diagnosis not present

## 2018-05-22 DIAGNOSIS — Z363 Encounter for antenatal screening for malformations: Secondary | ICD-10-CM | POA: Diagnosis not present

## 2018-05-22 DIAGNOSIS — Z3A18 18 weeks gestation of pregnancy: Secondary | ICD-10-CM

## 2018-05-22 DIAGNOSIS — Z3689 Encounter for other specified antenatal screening: Secondary | ICD-10-CM

## 2018-05-22 DIAGNOSIS — O0990 Supervision of high risk pregnancy, unspecified, unspecified trimester: Secondary | ICD-10-CM

## 2018-05-22 DIAGNOSIS — O09293 Supervision of pregnancy with other poor reproductive or obstetric history, third trimester: Secondary | ICD-10-CM

## 2018-05-29 ENCOUNTER — Ambulatory Visit (INDEPENDENT_AMBULATORY_CARE_PROVIDER_SITE_OTHER): Payer: Medicaid Other | Admitting: Obstetrics and Gynecology

## 2018-05-29 ENCOUNTER — Encounter: Payer: Self-pay | Admitting: Obstetrics and Gynecology

## 2018-05-29 VITALS — BP 108/74 | HR 105 | Wt 163.8 lb

## 2018-05-29 DIAGNOSIS — B951 Streptococcus, group B, as the cause of diseases classified elsewhere: Secondary | ICD-10-CM

## 2018-05-29 DIAGNOSIS — N90810 Female genital mutilation status, unspecified: Secondary | ICD-10-CM

## 2018-05-29 DIAGNOSIS — Z789 Other specified health status: Secondary | ICD-10-CM

## 2018-05-29 DIAGNOSIS — O0990 Supervision of high risk pregnancy, unspecified, unspecified trimester: Secondary | ICD-10-CM

## 2018-05-29 DIAGNOSIS — R7989 Other specified abnormal findings of blood chemistry: Secondary | ICD-10-CM

## 2018-05-29 DIAGNOSIS — O09299 Supervision of pregnancy with other poor reproductive or obstetric history, unspecified trimester: Secondary | ICD-10-CM

## 2018-05-29 NOTE — Progress Notes (Signed)
   PRENATAL VISIT NOTE  Subjective:  Jacqueline Irwin is a 27 y.o. [phone] at 7769w2d being seen today for ongoing prenatal care.  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  Patient reports occasional mild cramping, some white discharge she thinks it normal. Had a headache that improved with tylenol.  Contractions: Irritability. Vag. Bleeding: None.  Movement: Present. Denies leaking of fluid.   The following portions of the patient's history were reviewed and updated as appropriate: allergies, current medications, past family history, past medical history, past social history, past surgical history and problem list. Problem list updated.  Objective:   Vitals:   05/29/18 0902  BP: 108/74  Pulse: (!) 105  Weight: 163 lb 12.8 oz (74.3 kg)    Fetal Status: Fetal Heart Rate (bpm): 142   Movement: Present     General:  Alert, oriented and cooperative. Patient is in no acute distress.  Skin: Skin is warm and dry. No rash noted.   Cardiovascular: Normal heart rate noted  Respiratory: Normal respiratory effort, no problems with respiration noted  Abdomen: Soft, gravid, appropriate for gestational age.  Pain/Pressure: Absent     Pelvic: Cervical exam deferred        Extremities: Normal range of motion.  Edema: None  Mental Status: Normal mood and affect. Normal behavior. Normal judgment and thought content.   Assessment and Plan:  Pregnancy: [id number] at 3969w2d  1. Positive GBS test txtd with macrobid  2. Supervision of high risk pregnancy,  antepartum   3. Hx of preeclampsia, prior pregnancy, currently pregnant Cont baby ASA BP stable  4. Non-English speaking patient Print production plannerArabic translator present  5. 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 Abnormal thyroid stimulating hormone (TSH) level during last pregnancy Labs this pregnancy normal  Preterm labor symptoms and general obstetric precautions including but not limited to vaginal bleeding, contractions, leaking of fluid and fetal movement were reviewed in detail with the patient. Please refer to After Visit Summary for other counseling recommendations.  Return in about 1 month (around 06/26/2018) for OB visit (MD).  Future Appointments  Date Time Provider Department Center  06/26/2018  8:45 AM Conan Bowensavis, Kelly M, MD [facility] None  07/31/2018  8:30 AM [facility] US 1 [facility]    Conan BowensKelly M Davis, MD

## 2018-06-26 ENCOUNTER — Ambulatory Visit (INDEPENDENT_AMBULATORY_CARE_PROVIDER_SITE_OTHER): Payer: Medicaid Other | Admitting: Obstetrics and Gynecology

## 2018-06-26 ENCOUNTER — Encounter: Payer: Self-pay | Admitting: Obstetrics and Gynecology

## 2018-06-26 VITALS — BP 99/69 | HR 97 | Wt 166.0 lb

## 2018-06-26 DIAGNOSIS — B951 Streptococcus, group B, as the cause of diseases classified elsewhere: Secondary | ICD-10-CM

## 2018-06-26 DIAGNOSIS — Z789 Other specified health status: Secondary | ICD-10-CM

## 2018-06-26 DIAGNOSIS — O09299 Supervision of pregnancy with other poor reproductive or obstetric history, unspecified trimester: Secondary | ICD-10-CM

## 2018-06-26 DIAGNOSIS — N90810 Female genital mutilation status, unspecified: Secondary | ICD-10-CM

## 2018-06-26 DIAGNOSIS — O0990 Supervision of high risk pregnancy, unspecified, unspecified trimester: Secondary | ICD-10-CM

## 2018-06-26 DIAGNOSIS — Z8751 Personal history of pre-term labor: Secondary | ICD-10-CM

## 2018-06-26 NOTE — Progress Notes (Signed)
   PRENATAL VISIT NOTE  Subjective:  Jacqueline Irwin is a 27 y.o. [phone] at 4924w2d being seen today for ongoing prenatal care.  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  Patient reports no complaints. Occasional minor cramping.  Contractions: Irritability. Vag. Bleeding: None.  Movement: Present. Denies leaking of fluid.   The following portions of the patient's history were reviewed and updated as appropriate: allergies, current medications, past family history, past medical history, past social history, past surgical history and problem list. Problem list updated.  Objective:   Vitals:   06/26/18 0901  BP: 99/69  Pulse: 97  Weight: 166 lb (75.3 kg)    Fetal Status: Fetal Heart Rate (bpm): 150   Movement: Present     General:  Alert, oriented and cooperative. Patient is in no acute distress.  Skin: Skin is warm and dry. No rash noted.   Cardiovascular: Normal heart rate noted  Respiratory: Normal respiratory effort, no problems with respiration noted  Abdomen: Soft, gravid, appropriate for gestational age.  Pain/Pressure: Present     Pelvic: Cervical exam deferred        Extremities: Normal range of motion.  Edema: None  Mental Status: Normal mood and affect. Normal behavior. Normal judgment and thought content.   Assessment and Plan:  Pregnancy: [id number] at 1724w2d  1. Supervision of high risk pregnancy, antepartum  2. Non-English speaking patient Arabic speaking Interpretor used  3. Hx of preeclampsia, prior pregnancy,  currently pregnant Cont baby ASA growth scheduled for 9/26  4. History of preterm delivery Due to pre-eclampsia  5. Positive GBS test ppx in labor  6. 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 Please refer to After Visit Summary for other counseling recommendations.  Return in about 4 weeks (around 07/24/2018) for OB visit (MD), 2 hr GTT, 3rd trim labs.  Future Appointments  Date Time Provider Department Center  07/24/2018  8:00 AM [facility] None  07/24/2018  8:15 AM Constant, Gigi GinPeggy, MD [facility] None  07/31/2018  8:30 AM [facility] US 1 [facility]    Jacqueline BowensKelly M Davis, MD

## 2018-07-24 ENCOUNTER — Other Ambulatory Visit: Payer: Medicaid Other

## 2018-07-24 ENCOUNTER — Encounter: Payer: Self-pay | Admitting: Obstetrics and Gynecology

## 2018-07-24 ENCOUNTER — Ambulatory Visit (INDEPENDENT_AMBULATORY_CARE_PROVIDER_SITE_OTHER): Payer: Medicaid Other | Admitting: Obstetrics and Gynecology

## 2018-07-24 VITALS — BP 106/73 | HR 100 | Wt 168.0 lb

## 2018-07-24 DIAGNOSIS — B951 Streptococcus, group B, as the cause of diseases classified elsewhere: Secondary | ICD-10-CM

## 2018-07-24 DIAGNOSIS — Z789 Other specified health status: Secondary | ICD-10-CM

## 2018-07-24 DIAGNOSIS — O0990 Supervision of high risk pregnancy, unspecified, unspecified trimester: Secondary | ICD-10-CM

## 2018-07-24 DIAGNOSIS — Z23 Encounter for immunization: Secondary | ICD-10-CM

## 2018-07-24 DIAGNOSIS — O09299 Supervision of pregnancy with other poor reproductive or obstetric history, unspecified trimester: Secondary | ICD-10-CM

## 2018-07-24 MED ORDER — TETANUS-DIPHTH-ACELL PERTUSSIS 5-2.5-18.5 LF-MCG/0.5 IM SUSP
0.5000 mL | Freq: Once | INTRAMUSCULAR | Status: AC
  Administered 2018-07-24: 0.5 mL via INTRAMUSCULAR

## 2018-07-24 NOTE — Progress Notes (Signed)
   PRENATAL VISIT NOTE  Subjective:  Jacqueline Irwin is a 27 y.o. [phone] at 6130w2d being seen today for ongoing prenatal care.  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  Patient reports no complaints.  Contractions: Not present. Vag. Bleeding: None.  Movement: Present. Denies leaking of fluid.   The following portions of the patient's history were reviewed and updated as appropriate: allergies, current medications, past family history, past medical history, past social history, past surgical history and problem list. Problem list updated.  Objective:   Vitals:   07/24/18 0824  BP: 106/73  Pulse: 100  Weight: 168 lb (76.2 kg)    Fetal Status: Fetal Heart Rate (bpm): 145 Fundal Height: 28 cm Movement: Present     General:  Alert, oriented and cooperative. Patient is in no acute distress.  Skin: Skin is warm and dry. No rash noted.   Cardiovascular: Normal heart rate noted  Respiratory: Normal respiratory effort, no problems with respiration noted  Abdomen: Soft, gravid, appropriate for gestational age.  Pain/Pressure: Absent     Pelvic: Cervical exam deferred        Extremities: Normal range of motion.     Mental Status: Normal mood and affect. Normal behavior. Normal judgment and thought content.   Assessment and Plan:  Pregnancy: [id number] at 7730w2d  1. Supervision of high risk pregnancy, antepartum Patient is doing well without complaints Third trimester labs today Tdap and flu vaccine today  2. Hx of preeclampsia, prior  pregnancy, currently pregnant Continue ASA  3. Non-English speaking patient Arabic interpreter present   4. Positive GBS test Will receive prophylaxis in labor  Preterm labor symptoms and general obstetric precautions including but not limited to vaginal bleeding, contractions, leaking of fluid and fetal movement were reviewed in detail with the patient. Please refer to After Visit Summary for other counseling recommendations.  Return in about 2 weeks (around 08/07/2018) for ROB.  Future Appointments  Date Time Provider Department Center  07/31/2018  8:30 AM [facility] US 1 [facility]    Catalina AntiguaPeggy Renise Gillies, MD

## 2018-07-25 LAB — CBC
HEMATOCRIT: 32 % — AB (ref 34.0–46.6)
HEMOGLOBIN: 10.6 g/dL — AB (ref 11.1–15.9)
MCH: 27.7 pg (ref 26.6–33.0)
MCHC: 33.1 g/dL (ref 31.5–35.7)
MCV: 84 fL (ref 79–97)
Platelets: 212 10*3/uL (ref 150–450)
RBC: 3.83 x10E6/uL (ref 3.77–5.28)
RDW: 13.4 % (ref 12.3–15.4)
WBC: 7.8 10*3/uL (ref 3.4–10.8)

## 2018-07-25 LAB — HIV ANTIBODY (ROUTINE TESTING W REFLEX): HIV Screen 4th Generation wRfx: NONREACTIVE

## 2018-07-25 LAB — GLUCOSE TOLERANCE, 2 HOURS W/ 1HR
Glucose, 1 hour: 99 mg/dL (ref 65–179)
Glucose, 2 hour: 93 mg/dL (ref 65–152)
Glucose, Fasting: 81 mg/dL (ref 65–91)

## 2018-07-25 LAB — RPR: RPR Ser Ql: NONREACTIVE

## 2018-07-31 ENCOUNTER — Encounter (HOSPITAL_COMMUNITY): Payer: Self-pay

## 2018-07-31 ENCOUNTER — Ambulatory Visit (HOSPITAL_COMMUNITY)
Admission: RE | Admit: 2018-07-31 | Discharge: 2018-07-31 | Disposition: A | Discharge status: Home / Self Care | Payer: Medicaid Other | Source: Ambulatory Visit | Attending: Certified Nurse Midwife | Admitting: Certified Nurse Midwife

## 2018-07-31 ENCOUNTER — Other Ambulatory Visit (HOSPITAL_COMMUNITY): Payer: Self-pay | Admitting: *Deleted

## 2018-07-31 DIAGNOSIS — O09293 Supervision of pregnancy with other poor reproductive or obstetric history, third trimester: Secondary | ICD-10-CM | POA: Diagnosis present

## 2018-07-31 DIAGNOSIS — Z362 Encounter for other antenatal screening follow-up: Secondary | ICD-10-CM | POA: Diagnosis not present

## 2018-07-31 DIAGNOSIS — Z3A28 28 weeks gestation of pregnancy: Secondary | ICD-10-CM

## 2018-08-07 ENCOUNTER — Encounter: Payer: Self-pay | Admitting: Obstetrics and Gynecology

## 2018-08-07 ENCOUNTER — Ambulatory Visit (INDEPENDENT_AMBULATORY_CARE_PROVIDER_SITE_OTHER): Payer: Medicaid Other | Admitting: Obstetrics and Gynecology

## 2018-08-07 VITALS — BP 112/69 | HR 101 | Wt 169.9 lb

## 2018-08-07 DIAGNOSIS — O09299 Supervision of pregnancy with other poor reproductive or obstetric history, unspecified trimester: Secondary | ICD-10-CM

## 2018-08-07 DIAGNOSIS — O09293 Supervision of pregnancy with other poor reproductive or obstetric history, third trimester: Secondary | ICD-10-CM

## 2018-08-07 DIAGNOSIS — Z789 Other specified health status: Secondary | ICD-10-CM

## 2018-08-07 DIAGNOSIS — O0990 Supervision of high risk pregnancy, unspecified, unspecified trimester: Secondary | ICD-10-CM

## 2018-08-07 DIAGNOSIS — O0993 Supervision of high risk pregnancy, unspecified, third trimester: Secondary | ICD-10-CM

## 2018-08-07 DIAGNOSIS — B951 Streptococcus, group B, as the cause of diseases classified elsewhere: Secondary | ICD-10-CM

## 2018-08-07 NOTE — Progress Notes (Signed)
   PRENATAL VISIT NOTE  Subjective:  Jacqueline Irwin is a 27 y.o. [phone] at [redacted]w[redacted]d being seen today for ongoing prenatal care.  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  Patient reports no complaints.  Contractions: Irregular. Vag. Bleeding: None.  Movement: Present. Denies leaking of fluid.   The following portions of the patient's history were reviewed and updated as appropriate: allergies, current medications, past family history, past medical history, past social history, past surgical history and problem list. Problem list updated.  Objective:   Vitals:   08/07/18 1056  BP: 112/69  Pulse: (!) 101  Weight: 169 lb 14.4 oz (77.1 kg)    Fetal Status: Fetal Heart Rate (bpm): 145   Movement: Present     General:  Alert, oriented and cooperative. Patient is in no acute distress.  Skin: Skin is warm and dry. No rash noted.   Cardiovascular: Normal heart rate noted  Respiratory: Normal respiratory effort, no problems with respiration noted  Abdomen: Soft, gravid, appropriate for gestational age.  Pain/Pressure: Absent     Pelvic: Cervical exam deferred        Extremities: Normal range of motion.  Edema: Trace  Mental Status: Normal mood and affect. Normal behavior. Normal judgment and thought content.   Assessment and Plan:  Pregnancy: [id number] at [redacted]w[redacted]d  1. Supervision of high risk pregnancy, antepartum  2. Hx of preeclampsia, prior pregnancy, currently pregnant Cont baby ASA Last growth 36th%tile Repeat growth scheduled  09/11/18 Per MFM, no need for weekly testing in absence of hypertensive disorder  3. Non-English speaking patient Print production planner used  4. Positive GBS test ppx in labor   Preterm labor symptoms and general obstetric precautions including but not limited to vaginal bleeding, contractions, leaking of fluid and fetal movement were reviewed in detail with the patient. Please refer to After Visit Summary for other counseling recommendations.  Return in about 2 weeks (around 08/21/2018) for OB visit (MD).  Future Appointments  Date Time Provider Department Center  09/11/2018 10:00 AM [facility] [place] 3 [facility]    Conan Bowens, MD

## 2018-08-07 NOTE — Progress Notes (Signed)
Pt presents for ROB with no complaints today.

## 2018-08-25 ENCOUNTER — Encounter: Payer: Self-pay | Admitting: Obstetrics and Gynecology

## 2018-08-25 ENCOUNTER — Ambulatory Visit (INDEPENDENT_AMBULATORY_CARE_PROVIDER_SITE_OTHER): Payer: Medicaid Other | Admitting: Obstetrics and Gynecology

## 2018-08-25 VITALS — BP 103/68 | HR 96 | Wt 171.0 lb

## 2018-08-25 DIAGNOSIS — Z8759 Personal history of other complications of pregnancy, childbirth and the puerperium: Secondary | ICD-10-CM

## 2018-08-25 DIAGNOSIS — Z789 Other specified health status: Secondary | ICD-10-CM

## 2018-08-25 DIAGNOSIS — O09299 Supervision of pregnancy with other poor reproductive or obstetric history, unspecified trimester: Secondary | ICD-10-CM

## 2018-08-25 DIAGNOSIS — B951 Streptococcus, group B, as the cause of diseases classified elsewhere: Secondary | ICD-10-CM

## 2018-08-25 DIAGNOSIS — O0990 Supervision of high risk pregnancy, unspecified, unspecified trimester: Secondary | ICD-10-CM

## 2018-08-25 DIAGNOSIS — L900 Lichen sclerosus et atrophicus: Secondary | ICD-10-CM

## 2018-08-25 HISTORY — DX: Personal history of other complications of pregnancy, childbirth and the puerperium: Z87.59

## 2018-08-25 MED ORDER — CLOBETASOL PROPIONATE 0.05 % EX OINT: 1.0000 "application " | TOPICAL_OINTMENT | Freq: Two times a day (BID) | CUTANEOUS | 0 refills | Status: DC

## 2018-08-25 MED ORDER — BACITRACIN 500 UNIT/GM EX OINT: 1.0000 "application " | TOPICAL_OINTMENT | Freq: Two times a day (BID) | CUTANEOUS | 0 refills | Status: DC

## 2018-08-25 NOTE — Progress Notes (Signed)
   PRENATAL VISIT NOTE  Subjective:  Jacqueline Irwin is a 27 y.o. [phone] at [redacted]w[redacted]d being seen today for ongoing prenatal care.  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  Patient reports no complaints.  Contractions: Not present. Vag. Bleeding: None.  Movement: Present. Denies leaking of fluid.   The following portions of the patient's history were reviewed and updated as appropriate: allergies, current medications, past family history, past medical history, past social history, past surgical history and problem list. Problem list updated.  Objective:   Vitals:   08/25/18 1125  BP: 103/68  Pulse: 96  Weight: 171 lb (77.6 kg)    Fetal Status: Fetal Heart Rate (bpm): 148 Fundal Height: 32 cm Movement: Present     General:  Alert, oriented and cooperative. Patient is in no acute distress.  Skin: Skin is warm and dry. No rash noted.   Cardiovascular: Normal heart rate noted  Respiratory: Normal respiratory effort, no problems with respiration noted  Abdomen: Soft, gravid, appropriate for gestational age.  Pain/Pressure: Present     Pelvic: Cervical exam deferred        Extremities: Normal range of motion.  Edema: None  Mental Status: Normal mood and affect. Normal behavior. Normal judgment and thought content.   Assessment and Plan:  Pregnancy: [id number] at [redacted]w[redacted]d  1. Supervision of high risk pregnancy, antepartum Patient is doing well without complaints  2. Hx of preeclampsia, prior pregnancy, currently  pregnant Continue ASA Normotensive without symptoms Follow up growth ultrasound 11/7 (per MFM no need for antenatal testing) Patient would like to avoid IOL if possible  3. Positive GBS test Will receive prophylaxis in labor  4. Non-English speaking patient Arabic interpreter present  5. History of stillbirth Due to preeclampsia   Preterm labor symptoms and general obstetric precautions including but not limited to vaginal bleeding, contractions, leaking of fluid and fetal movement were reviewed in detail with the patient. Please refer to After Visit Summary for other counseling recommendations.  No follow-ups on file.  Future Appointments  Date Time Provider Department Center  09/11/2018 10:00 AM [facility] [place] 3 [facility]    Catalina Antigua, MD

## 2018-08-25 NOTE — Progress Notes (Signed)
Needs refill on 2 ointments for rash.

## 2018-09-09 ENCOUNTER — Ambulatory Visit (INDEPENDENT_AMBULATORY_CARE_PROVIDER_SITE_OTHER): Payer: Medicaid Other | Admitting: Obstetrics and Gynecology

## 2018-09-09 ENCOUNTER — Other Ambulatory Visit: Payer: Self-pay

## 2018-09-09 ENCOUNTER — Encounter: Payer: Self-pay | Admitting: Obstetrics and Gynecology

## 2018-09-09 VITALS — BP 108/74 | HR 111 | Wt 171.0 lb

## 2018-09-09 DIAGNOSIS — N90810 Female genital mutilation status, unspecified: Secondary | ICD-10-CM

## 2018-09-09 DIAGNOSIS — O0990 Supervision of high risk pregnancy, unspecified, unspecified trimester: Secondary | ICD-10-CM

## 2018-09-09 DIAGNOSIS — Z8759 Personal history of other complications of pregnancy, childbirth and the puerperium: Secondary | ICD-10-CM

## 2018-09-09 DIAGNOSIS — Z789 Other specified health status: Secondary | ICD-10-CM

## 2018-09-09 DIAGNOSIS — B951 Streptococcus, group B, as the cause of diseases classified elsewhere: Secondary | ICD-10-CM

## 2018-09-09 DIAGNOSIS — O09299 Supervision of pregnancy with other poor reproductive or obstetric history, unspecified trimester: Secondary | ICD-10-CM

## 2018-09-09 MED ORDER — TERCONAZOLE 0.8 % VA CREA: 1.0000 | TOPICAL_CREAM | Freq: Every day | VAGINAL | 1 refills | Status: DC

## 2018-09-09 NOTE — Progress Notes (Signed)
Subjective:  Jacqueline Irwin is a 27 y.o. [phone] at [redacted]w[redacted]d being seen today for ongoing prenatal care.  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  Patient reports vaginal irritation and tooth ache.  Contractions: Not present. Vag. Bleeding: None.  Movement: Present. Denies leaking of fluid.   The following portions of the patient's history were reviewed and updated as appropriate: allergies, current medications, past family history, past medical history, past social history, past surgical history and problem list. Problem list updated.  Objective:   Vitals:   09/09/18 1059  BP: 108/74  Pulse: (!) 111  Weight: 171 lb (77.6 kg)    Fetal Status: Fetal Heart Rate (bpm): 154   Movement: Present     General:  Alert, oriented and cooperative. Patient is in no acute distress.  Skin: Skin is warm and dry. No rash noted.   Cardiovascular: Normal heart rate noted  Respiratory: Normal respiratory effort, no problems with respiration noted  Abdomen: Soft, gravid, appropriate for gestational age. Pain/Pressure: Absent     Pelvic:  Cervical exam deferred        Extremities: Normal range of motion.     Mental Status: Normal mood and affect. Normal behavior. Normal judgment and thought content.   Urinalysis:      Assessment and Plan:  Pregnancy: [id number] at [redacted]w[redacted]d  1. Supervision of high risk pregnancy, antepartum Stable List of dentist provided to pt - terconazole (TERAZOL 3) 0.8 % vaginal cream; Place 1 applicator vaginally at bedtime. Apply nightly for three nights.  Dispense: 20 g; Refill: 1  2. Hx of preeclampsia, prior pregnancy,  currently pregnant No S/Sx at present BP stable without meds Continue with qd BASA Growth scan this week  3. History of stillbirth Secondary to [facility]  4. Positive GBS test Tx while in labor  5. Non-English speaking patient Interrupter services used  6. 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 Please refer to After Visit Summary for other counseling recommendations.  Return in about 2 weeks (around 09/23/2018) for OB visit.   Hermina Staggers, MD

## 2018-09-11 ENCOUNTER — Ambulatory Visit (HOSPITAL_COMMUNITY)
Admission: RE | Admit: 2018-09-11 | Discharge: 2018-09-11 | Disposition: A | Discharge status: Home / Self Care | Payer: Medicaid Other | Source: Ambulatory Visit | Attending: Obstetrics and Gynecology | Admitting: Obstetrics and Gynecology

## 2018-09-11 ENCOUNTER — Encounter (HOSPITAL_COMMUNITY): Payer: Self-pay

## 2018-09-11 ENCOUNTER — Other Ambulatory Visit (HOSPITAL_COMMUNITY): Payer: Self-pay | Admitting: Obstetrics and Gynecology

## 2018-09-11 DIAGNOSIS — O09293 Supervision of pregnancy with other poor reproductive or obstetric history, third trimester: Secondary | ICD-10-CM | POA: Diagnosis present

## 2018-09-11 DIAGNOSIS — Z3A34 34 weeks gestation of pregnancy: Secondary | ICD-10-CM | POA: Insufficient documentation

## 2018-09-15 ENCOUNTER — Telehealth: Payer: Self-pay | Admitting: *Deleted

## 2018-09-15 NOTE — Telephone Encounter (Signed)
Dental office aware letter faxed today

## 2018-09-15 NOTE — Telephone Encounter (Signed)
Received a voicemail today from someone at [employer] office stating they are calling from his dental office and that they are calling about this patient- she does not speak [place] .Sttes she has an dental appointment with them tomorrow and they need a clearance letter .States they have had to reschedule once because of this. States you can call them at [phone] and fax # is [phone]. Per chart review is a [facility] patient. Will route to that office.

## 2018-09-25 ENCOUNTER — Encounter: Payer: Self-pay | Admitting: Obstetrics and Gynecology

## 2018-09-25 ENCOUNTER — Ambulatory Visit (INDEPENDENT_AMBULATORY_CARE_PROVIDER_SITE_OTHER): Payer: Medicaid Other | Admitting: Obstetrics and Gynecology

## 2018-09-25 ENCOUNTER — Other Ambulatory Visit (HOSPITAL_COMMUNITY)
Admission: RE | Admit: 2018-09-25 | Discharge: 2018-09-25 | Disposition: A | Discharge status: Home / Self Care | Payer: Medicaid Other | Source: Ambulatory Visit | Attending: Obstetrics and Gynecology | Admitting: Obstetrics and Gynecology

## 2018-09-25 VITALS — BP 110/75 | HR 97 | Wt 170.0 lb

## 2018-09-25 DIAGNOSIS — B951 Streptococcus, group B, as the cause of diseases classified elsewhere: Secondary | ICD-10-CM

## 2018-09-25 DIAGNOSIS — O0990 Supervision of high risk pregnancy, unspecified, unspecified trimester: Secondary | ICD-10-CM

## 2018-09-25 DIAGNOSIS — O09299 Supervision of pregnancy with other poor reproductive or obstetric history, unspecified trimester: Secondary | ICD-10-CM

## 2018-09-25 DIAGNOSIS — O0993 Supervision of high risk pregnancy, unspecified, third trimester: Secondary | ICD-10-CM

## 2018-09-25 DIAGNOSIS — O09293 Supervision of pregnancy with other poor reproductive or obstetric history, third trimester: Secondary | ICD-10-CM

## 2018-09-25 DIAGNOSIS — Z8759 Personal history of other complications of pregnancy, childbirth and the puerperium: Secondary | ICD-10-CM

## 2018-09-25 LAB — OB RESULTS CONSOLE GC/CHLAMYDIA: Gonorrhea: NEGATIVE

## 2018-09-25 NOTE — Progress Notes (Signed)
Subjective:  Jacqueline Irwin is a 27 y.o. [phone] at 6034w2d being seen today for ongoing prenatal care.  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  Patient reports no complaints.  Contractions: Not present. Vag. Bleeding: None.  Movement: Present. Denies leaking of fluid.   The following portions of the patient's history were reviewed and updated as appropriate: allergies, current medications, past family history, past medical history, past social history, past surgical history and problem list. Problem list updated.  Objective:   Vitals:   09/25/18 0855  BP: 110/75  Pulse: 97  Weight: 170 lb (77.1 kg)    Fetal Status: Fetal Heart Rate (bpm): 154   Movement: Present     General:  Alert, oriented and cooperative. Patient is in no acute distress.  Skin: Skin is warm and dry. No rash noted.   Cardiovascular: Normal heart rate noted  Respiratory: Normal respiratory effort, no problems with respiration noted  Abdomen: Soft, gravid, appropriate for gestational age. Pain/Pressure: Present     Pelvic:  Cervical exam performed        Extremities: Normal range of motion.  Edema: None  Mental Status: Normal mood and affect. Normal behavior. Normal judgment and thought content.   Urinalysis:      Assessment and Plan:  Pregnancy: [id number] at 6634w2d  1. Supervision of high risk pregnancy, antepartum Stable Vaginal cultures obtained  2. Hx of preeclampsia, prior pregnancy, currently pregnant BP stable No S/Sx of PEC Continue with BASA Growth 45 % on 09/11/18  3. History of stillbirth D/T SPEC  4. Positive GBS test Tx while in  labor  Term labor symptoms and general obstetric precautions including but not limited to vaginal bleeding, contractions, leaking of fluid and fetal movement were reviewed in detail with the patient. Please refer to After Visit Summary for other counseling recommendations.  Return in about 1 week (around 10/02/2018) for OB visit.   Hermina StaggersErvin, Nansi Birmingham L, MD

## 2018-09-25 NOTE — Progress Notes (Signed)
GBS + in urine at NOB noted to treat in labor.

## 2018-09-26 LAB — GC/CHLAMYDIA PROBE AMP (~~LOC~~) NOT AT ARMC
Chlamydia: NEGATIVE
Neisseria Gonorrhea: NEGATIVE

## 2018-09-30 ENCOUNTER — Encounter: Payer: Self-pay | Admitting: Family Medicine

## 2018-09-30 ENCOUNTER — Ambulatory Visit (INDEPENDENT_AMBULATORY_CARE_PROVIDER_SITE_OTHER): Payer: Medicaid Other | Admitting: Family Medicine

## 2018-09-30 VITALS — BP 106/75 | HR 89 | Wt 173.0 lb

## 2018-09-30 DIAGNOSIS — B951 Streptococcus, group B, as the cause of diseases classified elsewhere: Secondary | ICD-10-CM

## 2018-09-30 DIAGNOSIS — Z8759 Personal history of other complications of pregnancy, childbirth and the puerperium: Secondary | ICD-10-CM

## 2018-09-30 DIAGNOSIS — O09299 Supervision of pregnancy with other poor reproductive or obstetric history, unspecified trimester: Secondary | ICD-10-CM

## 2018-09-30 DIAGNOSIS — O09293 Supervision of pregnancy with other poor reproductive or obstetric history, third trimester: Secondary | ICD-10-CM | POA: Diagnosis not present

## 2018-09-30 DIAGNOSIS — O99820 Streptococcus B carrier state complicating pregnancy: Secondary | ICD-10-CM

## 2018-09-30 DIAGNOSIS — Z3A37 37 weeks gestation of pregnancy: Secondary | ICD-10-CM

## 2018-09-30 DIAGNOSIS — O10913 Unspecified pre-existing hypertension complicating pregnancy, third trimester: Secondary | ICD-10-CM | POA: Diagnosis not present

## 2018-09-30 DIAGNOSIS — O0990 Supervision of high risk pregnancy, unspecified, unspecified trimester: Secondary | ICD-10-CM

## 2018-09-30 NOTE — Patient Instructions (Signed)

## 2018-09-30 NOTE — Progress Notes (Signed)
Pt c/o gas and feels food is not digesting well.

## 2018-09-30 NOTE — Progress Notes (Signed)
   PRENATAL VISIT NOTE  Subjective:  Noel Ahmed Einar Pheasantoreen is a 27 y.o. [phone] at 342w0d being seen today for ongoing prenatal care.  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  Patient reports nausea, vomiting and abdominal bloating-no change in diet, regular BMs.  Contractions: Irregular. Vag. Bleeding: None.  Movement: Present. Denies leaking of fluid.   The following portions of the patient's history were reviewed and updated as appropriate: allergies, current medications, past family history, past medical history, past social history, past surgical history and problem list. Problem list updated.  Objective:   Vitals:   09/30/18 0937  BP: 106/75  Pulse: 89  Weight: 173 lb (78.5 kg)    Fetal Status: Fetal Heart Rate (bpm): 152   Movement: Present     General:  Alert, oriented and cooperative. Patient is in no acute distress.  Skin: Skin is warm and dry. No rash noted.   Cardiovascular: Normal heart rate noted  Respiratory: Normal respiratory effort, no problems with respiration noted  Abdomen: Soft, gravid, appropriate for gestational age.  Pain/Pressure: Present     Pelvic: Cervical exam deferred        Extremities: Normal range of motion.  Edema: None  Mental Status: Normal mood and affect. Normal behavior. Normal judgment and thought content.   Assessment and Plan:  Pregnancy: [id number] at 682w0d  1. Supervision of high risk pregnancy, antepartum - US OB Limited; Future  2. Positive GBS test Will need treatment in labor  3. Hx of preeclampsia, prior pregnancy, currently pregnant Continue ASA--BP is ok - US OB Limited;  Future  4. History of stillbirth NST:  Baseline: 140 bpm, Variability: Good {> 6 bpm), Accelerations: Reactive and Decelerations: Late on back--good variability and accels present Needs 2x/wk testing and IOL at 39 wks--in office foley prior to that. - US OB Limited; Future  Term labor symptoms and general obstetric precautions including but not limited to vaginal bleeding, contractions, leaking of fluid and fetal movement were reviewed in detail with the patient. Please refer to After Visit Summary for other counseling recommendations.  Return in 1 week (on 10/07/2018) for OB visit and NST with AFI (Monday), NST only next Thursday, 12/9 for outpt foley, repe.  Future Appointments  Date Time Provider Department Center  10/06/2018  9:00 AM [facility]TR[facility] None  10/09/2018 10:15 AM Constant, Gigi GinPeggy, MD [facility] None  10/13/2018  1:15 PM Constant, Gigi GinPeggy, MD [facility] None  10/14/2018  7:30 AM [facility] RO[facility] None    Reva Boresanya S Pratt, MD

## 2018-10-06 ENCOUNTER — Ambulatory Visit (INDEPENDENT_AMBULATORY_CARE_PROVIDER_SITE_OTHER): Payer: Medicaid Other | Admitting: Obstetrics and Gynecology

## 2018-10-06 ENCOUNTER — Ambulatory Visit (HOSPITAL_COMMUNITY)
Admission: RE | Admit: 2018-10-06 | Discharge: 2018-10-06 | Disposition: A | Discharge status: Home / Self Care | Payer: Medicaid Other | Source: Ambulatory Visit | Attending: Obstetrics and Gynecology | Admitting: Obstetrics and Gynecology

## 2018-10-06 ENCOUNTER — Other Ambulatory Visit: Payer: Medicaid Other

## 2018-10-06 DIAGNOSIS — O09293 Supervision of pregnancy with other poor reproductive or obstetric history, third trimester: Secondary | ICD-10-CM

## 2018-10-06 DIAGNOSIS — O289 Unspecified abnormal findings on antenatal screening of mother: Secondary | ICD-10-CM | POA: Diagnosis not present

## 2018-10-06 DIAGNOSIS — Z3A37 37 weeks gestation of pregnancy: Secondary | ICD-10-CM

## 2018-10-06 DIAGNOSIS — Z8759 Personal history of other complications of pregnancy, childbirth and the puerperium: Secondary | ICD-10-CM

## 2018-10-06 NOTE — Progress Notes (Signed)
Patient here for NST due to h/O IUFD NST reviewed and non reactive with baseline 140, mod variability , 10x10 accels, no decels Patient sent for BPP

## 2018-10-06 NOTE — Progress Notes (Signed)
Nst DX HX of Stillbirth.

## 2018-10-09 ENCOUNTER — Inpatient Hospital Stay (HOSPITAL_COMMUNITY): Payer: Medicaid Other | Admitting: Anesthesiology

## 2018-10-09 ENCOUNTER — Inpatient Hospital Stay (HOSPITAL_COMMUNITY)
Admission: AD | Admit: 2018-10-09 | Discharge: 2018-10-11 | DRG: 806 | Disposition: A | Discharge status: Home / Self Care | Payer: Medicaid Other | Attending: Obstetrics & Gynecology | Admitting: Obstetrics & Gynecology

## 2018-10-09 ENCOUNTER — Ambulatory Visit (INDEPENDENT_AMBULATORY_CARE_PROVIDER_SITE_OTHER): Payer: Medicaid Other | Admitting: Obstetrics and Gynecology

## 2018-10-09 ENCOUNTER — Telehealth (HOSPITAL_COMMUNITY): Payer: Self-pay | Admitting: *Deleted

## 2018-10-09 ENCOUNTER — Encounter: Payer: Self-pay | Admitting: Obstetrics and Gynecology

## 2018-10-09 VITALS — BP 110/75 | HR 108 | Wt 175.7 lb

## 2018-10-09 DIAGNOSIS — Z1611 Resistance to penicillins: Secondary | ICD-10-CM | POA: Diagnosis present

## 2018-10-09 DIAGNOSIS — O09299 Supervision of pregnancy with other poor reproductive or obstetric history, unspecified trimester: Secondary | ICD-10-CM

## 2018-10-09 DIAGNOSIS — Z3A38 38 weeks gestation of pregnancy: Secondary | ICD-10-CM

## 2018-10-09 DIAGNOSIS — O0990 Supervision of high risk pregnancy, unspecified, unspecified trimester: Secondary | ICD-10-CM

## 2018-10-09 DIAGNOSIS — Z8759 Personal history of other complications of pregnancy, childbirth and the puerperium: Secondary | ICD-10-CM

## 2018-10-09 DIAGNOSIS — Z789 Other specified health status: Secondary | ICD-10-CM | POA: Diagnosis present

## 2018-10-09 DIAGNOSIS — O99824 Streptococcus B carrier state complicating childbirth: Secondary | ICD-10-CM | POA: Diagnosis present

## 2018-10-09 DIAGNOSIS — O09293 Supervision of pregnancy with other poor reproductive or obstetric history, third trimester: Secondary | ICD-10-CM | POA: Diagnosis not present

## 2018-10-09 DIAGNOSIS — B951 Streptococcus, group B, as the cause of diseases classified elsewhere: Secondary | ICD-10-CM

## 2018-10-09 DIAGNOSIS — O0993 Supervision of high risk pregnancy, unspecified, third trimester: Secondary | ICD-10-CM

## 2018-10-09 DIAGNOSIS — Z3490 Encounter for supervision of normal pregnancy, unspecified, unspecified trimester: Secondary | ICD-10-CM | POA: Diagnosis present

## 2018-10-09 LAB — CBC
HCT: 32.3 % — ABNORMAL LOW (ref 36.0–46.0)
Hemoglobin: 10.1 g/dL — ABNORMAL LOW (ref 12.0–15.0)
MCH: 24.7 pg — ABNORMAL LOW (ref 26.0–34.0)
MCHC: 31.3 g/dL (ref 30.0–36.0)
MCV: 79 fL — ABNORMAL LOW (ref 80.0–100.0)
Platelets: 219 10*3/uL (ref 150–400)
RBC: 4.09 MIL/uL (ref 3.87–5.11)
RDW: 14.4 % (ref 11.5–15.5)
WBC: 8.6 10*3/uL (ref 4.0–10.5)
nRBC: 0.2 % (ref 0.0–0.2)

## 2018-10-09 LAB — TYPE AND SCREEN
ABO/RH(D): B POS
ANTIBODY SCREEN: NEGATIVE

## 2018-10-09 MED ORDER — OXYCODONE-ACETAMINOPHEN 5-325 MG PO TABS
1.0000 | ORAL_TABLET | ORAL | Status: DC | PRN
  Administered 2018-10-10: 1 via ORAL
  Filled 2018-10-09: qty 1

## 2018-10-09 MED ORDER — ACETAMINOPHEN 325 MG PO TABS
650.0000 mg | ORAL_TABLET | ORAL | Status: DC | PRN
  Administered 2018-10-10 – 2018-10-11 (×2): 650 mg via ORAL
  Filled 2018-10-09 (×2): qty 2

## 2018-10-09 MED ORDER — LACTATED RINGERS IV SOLN
INTRAVENOUS | Status: DC
  Administered 2018-10-09 – 2018-10-10 (×3): via INTRAVENOUS

## 2018-10-09 MED ORDER — FENTANYL CITRATE (PF) 100 MCG/2ML IJ SOLN
100.0000 ug | INTRAMUSCULAR | Status: DC | PRN
  Administered 2018-10-09 (×2): 100 ug via INTRAVENOUS
  Filled 2018-10-09 (×2): qty 2

## 2018-10-09 MED ORDER — LIDOCAINE HCL (PF) 1 % IJ SOLN
30.0000 mL | INTRAMUSCULAR | Status: DC | PRN
  Filled 2018-10-09: qty 30

## 2018-10-09 MED ORDER — VANCOMYCIN HCL IN DEXTROSE 1-5 GM/200ML-% IV SOLN
1000.0000 mg | Freq: Two times a day (BID) | INTRAVENOUS | Status: DC
  Administered 2018-10-09: 1000 mg via INTRAVENOUS
  Filled 2018-10-09 (×3): qty 200

## 2018-10-09 MED ORDER — SOD CITRATE-CITRIC ACID 500-334 MG/5ML PO SOLN: 30.0000 mL | ORAL | Status: DC | PRN

## 2018-10-09 MED ORDER — OXYTOCIN BOLUS FROM INFUSION
500.0000 mL | Freq: Once | INTRAVENOUS | Status: AC
  Administered 2018-10-10: 500 mL via INTRAVENOUS

## 2018-10-09 MED ORDER — OXYTOCIN 40 UNITS IN LACTATED RINGERS INFUSION - SIMPLE MED
2.5000 [IU]/h | INTRAVENOUS | Status: DC
  Administered 2018-10-10: 2.5 [IU]/h via INTRAVENOUS
  Filled 2018-10-09: qty 1000

## 2018-10-09 MED ORDER — LACTATED RINGERS IV SOLN
500.0000 mL | Freq: Once | INTRAVENOUS | Status: AC
  Administered 2018-10-09: 500 mL via INTRAVENOUS

## 2018-10-09 MED ORDER — PHENYLEPHRINE 40 MCG/ML (10ML) SYRINGE FOR IV PUSH (FOR BLOOD PRESSURE SUPPORT)
80.0000 ug | PREFILLED_SYRINGE | INTRAVENOUS | Status: DC | PRN
  Filled 2018-10-09 (×2): qty 10

## 2018-10-09 MED ORDER — FENTANYL 2.5 MCG/ML BUPIVACAINE 1/10 % EPIDURAL INFUSION (WH - ANES)
14.0000 mL/h | INTRAMUSCULAR | Status: DC | PRN
  Administered 2018-10-09: 14 mL/h via EPIDURAL
  Filled 2018-10-09: qty 100

## 2018-10-09 MED ORDER — PHENYLEPHRINE 40 MCG/ML (10ML) SYRINGE FOR IV PUSH (FOR BLOOD PRESSURE SUPPORT)
80.0000 ug | PREFILLED_SYRINGE | INTRAVENOUS | Status: DC | PRN
  Filled 2018-10-09: qty 10

## 2018-10-09 MED ORDER — MISOPROSTOL 50MCG HALF TABLET
50.0000 ug | ORAL_TABLET | ORAL | Status: DC | PRN
  Administered 2018-10-09 (×2): 50 ug via BUCCAL
  Filled 2018-10-09 (×3): qty 1

## 2018-10-09 MED ORDER — EPHEDRINE 5 MG/ML INJ
10.0000 mg | INTRAVENOUS | Status: DC | PRN
  Filled 2018-10-09: qty 2

## 2018-10-09 MED ORDER — TERBUTALINE SULFATE 1 MG/ML IJ SOLN
0.2500 mg | Freq: Once | INTRAMUSCULAR | Status: DC | PRN
  Filled 2018-10-09: qty 1

## 2018-10-09 MED ORDER — LACTATED RINGERS IV SOLN
500.0000 mL | INTRAVENOUS | Status: DC | PRN
  Administered 2018-10-09: 500 mL via INTRAVENOUS

## 2018-10-09 MED ORDER — ONDANSETRON HCL 4 MG/2ML IJ SOLN: 4.0000 mg | Freq: Four times a day (QID) | INTRAMUSCULAR | Status: DC | PRN

## 2018-10-09 MED ORDER — OXYCODONE-ACETAMINOPHEN 5-325 MG PO TABS: 2.0000 | ORAL_TABLET | ORAL | Status: DC | PRN

## 2018-10-09 MED ORDER — LIDOCAINE HCL (PF) 1 % IJ SOLN
INTRAMUSCULAR | Status: DC | PRN
  Administered 2018-10-09 (×2): 5 mL via EPIDURAL

## 2018-10-09 MED ORDER — DIPHENHYDRAMINE HCL 50 MG/ML IJ SOLN: 12.5000 mg | INTRAMUSCULAR | Status: DC | PRN

## 2018-10-09 NOTE — Telephone Encounter (Signed)
[phone] interpreter number  Preadmission screen

## 2018-10-09 NOTE — Anesthesia Preprocedure Evaluation (Signed)

## 2018-10-09 NOTE — Progress Notes (Signed)
LABOR PROGRESS NOTE  Chaquetta Elouise Munroehmed Kozinski is a 27 y.o. [phone] at 1136w2d  admitted for IOL 2/2 NRNST  Subjective: Patient has increased pain with contractions and is requesting epidural at this time.  Objective: BP 106/71   Pulse 72   Temp 98.8 F (37.1 C) (Oral)   Resp 16   Ht 5\' 4"  (1.626 m)   Wt 78.5 kg   LMP 01/14/2018 (Exact Date)   BMI 29.70 kg/m  or  Vitals:   10/09/18 2043 10/09/18 2148 10/09/18 2200 10/09/18 2303  BP: 107/77 106/78  106/71  Pulse: 80 76  72  Resp:      Temp:   98.8 F (37.1 C)   TempSrc:   Oral   Weight:      Height:        ~2300 Dilation: 6.5 Effacement (%): 50 Cervical Position: Middle Station: -2 Presentation: Vertex Exam by:: Drenda FreezeFran, CNM FHT: baseline rate 135, moderate varibility, occasional acel, none decel Toco: 1-4 min moderate  Labs: Lab Results  Component Value Date   WBC 8.6 10/09/2018   HGB 10.1 (L) 10/09/2018   HCT 32.3 (L) 10/09/2018   MCV 79.0 (L) 10/09/2018   PLT 219 10/09/2018    Patient Active Problem List   Diagnosis Date Noted  . Encounter for induction of labor 10/09/2018  . History of stillbirth 08/25/2018  . Positive GBS test 05/13/2018  . History of preterm delivery 05/01/2018  . Maternal varicella, non-immune 02/07/2017  . Non-English speaking patient 02/06/2017  . Lichen sclerosus 02/06/2017  . 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 Hx of preeclampsia, prior pregnancy, currently pregnant 02/06/2017  . Supervision of high risk pregnancy, antepartum 02/05/2017    Assessment / Plan:  27 y.o. [id number] at 7236w2d here for IOL for NRNST  Labor: patient had SROM and is progressing without further augmentation. Cervical check ~6.5cm/50% Fetal Wellbeing:  Category I Pain Control:  Patient requesting epidural placement Anticipated MOD:  vaginal  Ashyra Cantin DO 10/09/2018, 11:12 PM

## 2018-10-09 NOTE — Progress Notes (Signed)
ROB, placed on NST in OBIX.

## 2018-10-09 NOTE — Anesthesia Procedure Notes (Signed)
Epidural Patient location during procedure: OB  Staffing Anesthesiologist: Samyukta Cura, MD Performed: anesthesiologist   Preanesthetic Checklist Completed: patient identified, site marked, surgical consent, pre-op evaluation, timeout performed, IV checked, risks and benefits discussed and monitors and equipment checked  Epidural Patient position: sitting Prep: DuraPrep Patient monitoring: heart rate, continuous pulse ox and blood pressure Approach: right paramedian Location: L3-L4 Injection technique: LOR saline  Needle:  Needle type: Tuohy  Needle gauge: 17 G Needle length: 9 cm and 9 Needle insertion depth: 7 cm Catheter type: closed end flexible Catheter size: 20 Guage Catheter at skin depth: 11 cm Test dose: negative  Assessment Events: blood not aspirated, injection not painful, no injection resistance, negative IV test and no paresthesia  Additional Notes Patient identified. Risks/Benefits/Options discussed with patient including but not limited to bleeding, infection, nerve damage, paralysis, failed block, incomplete pain control, headache, blood pressure changes, nausea, vomiting, reactions to medication both or allergic, itching and postpartum back pain. Confirmed with bedside nurse the patient's most recent platelet count. Confirmed with patient that they are not currently taking any anticoagulation, have any bleeding history or any family history of bleeding disorders. Patient expressed understanding and wished to proceed. All questions were answered. Sterile technique was used throughout the entire procedure. Please see nursing notes for vital signs. Test dose was given through epidural needle and negative prior to continuing to dose epidural or start infusion. Warning signs of high block given to the patient including shortness of breath, tingling/numbness in hands, complete motor block, or any concerning symptoms with instructions to call for help. Patient was given  instructions on fall risk and not to get out of bed. All questions and concerns addressed with instructions to call with any issues.     

## 2018-10-09 NOTE — Progress Notes (Signed)
   PRENATAL VISIT NOTE  Subjective:  Jacqueline Irwin is a 27 y.o. [phone] at 5118w2d being seen today for ongoing prenatal care.  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  Patient reports no complaints.  Contractions: Irregular. Vag. Bleeding: None.  Movement: Present. Denies leaking of fluid.   The following portions of the patient's history were reviewed and updated as appropriate: allergies, current medications, past family history, past medical history, past social history, past surgical history and problem list. Problem list updated.  Objective:   Vitals:   10/09/18 1015  BP: 110/75  Pulse: (!) 108  Weight: 175 lb 11.2 oz (79.7 kg)    Fetal Status: Fetal Heart Rate (bpm): NST   Movement: Present     General:  Alert, oriented and cooperative. Patient is in no acute distress.  Skin: Skin is warm and dry. No rash noted.   Cardiovascular: Normal heart rate noted  Respiratory: Normal respiratory effort, no problems with respiration noted  Abdomen: Soft, gravid, appropriate for gestational age.  Pain/Pressure: Present     Pelvic: Cervical exam deferred        Extremities: Normal range of motion.  Edema: None  Mental Status: Normal mood and affect. Normal behavior. Normal judgment and thought content.   Assessment and Plan:  Pregnancy: [id number] at 818w2d  1. Supervision of high risk pregnancy, antepartum Patient is doing well without complaints  2. History of stillbirth BPP 8/8 earlier this week NST reviewed and non reactive with baseline 145, mod variability, + 10x10 accels, no decels BPP 12/2 8/10 MFM recommended  delivery Patient scheduled for IOL on 12/10 but will be sent to birthing suites to start induction of labor today. Case discussed with on call team  3. Hx of preeclampsia, prior pregnancy, currently pregnant Normotensive with symptoms  4. Positive GBS test Will receive prophylaxis in labor  5. Non-English speaking patient Arabic interpreter present  Term labor symptoms and general obstetric precautions including but not limited to vaginal bleeding, contractions, leaking of fluid and fetal movement were reviewed in detail with the patient. Please refer to After Visit Summary for other counseling recommendations.  No follow-ups on file.  Future Appointments  Date Time Provider Department Center  10/13/2018  1:15 PM Vyncent Overby, Gigi GinPeggy, MD [facility] None  10/14/2018  7:30 AM [facility] None    Jacqueline AntiguaPeggy Kaylina Cahue, MD

## 2018-10-09 NOTE — Anesthesia Pain Management Evaluation Note (Signed)
  [facility] Pain Management Visit Note  Patient: Jacqueline Irwin, 27 y.o., female  "Hello I am a [facility] the [facility]. We have an anesthesia team available at all times to provide care throughout the hospital, including epidural management and anesthesia for C-section. I don't know your plan for the delivery whether it a natural birth, water birth, IV sedation, nitrous supplementation, doula or epidural, but we want to meet your pain goals."   1.Was your pain managed to your expectations on prior hospitalizations?   Yes   2.What is your expectation for pain management during this hospitalization?     Epidural  3.How can we help you reach that goal? Place epidural at pain goal.  Record the patient's initial score and the patient's pain goal.   Pain: 3  Pain Goal: 5 [facility] wants you to be able to say your pain was always managed very well.  Nomar Broad 10/09/2018

## 2018-10-09 NOTE — H&P (Addendum)
OBSTETRIC ADMISSION HISTORY AND PHYSICAL  Jacqueline Irwin is a 27 y.o. female [phone] with IUP at 712w2d presenting for IOL in the setting of NRNST. She has a history of IUFD at 8 months of pregnancy which occurred while living in [place]. She reports that that occurred due to pre-eclampsia. She did not have elevated blood pressures in her most recent pregnancy delivered 07/2017. That pregnancy was uncomplicated and delivery went well. Took aspirin prophylaxis.  She reports +FMs. No LOF, VB, blurry vision, headaches, peripheral edema, or RUQ pain. She plans on breastfeeding. She requests IUD or depo for birth control.  Dating: By L/18 --->  Estimated Date of Delivery: 10/21/18  Sono:   @[redacted]w[redacted]d , CWD, normal anatomy, cephalic presentation, 2222 g, 45%ile   Prenatal History/Complications: Hx of IUFD Hx of Preeclampsia  Past Medical History: Past Medical History:  Diagnosis Date  . Medical history non-contributory     Past Surgical History: Past Surgical History:  Procedure Laterality Date  . abcess of vulva    . APPENDECTOMY      Obstetrical History: OB History    Gravida  4   Para  2   Term  1   Preterm  1   AB  1   Living  1     SAB  1   TAB      Ectopic      Multiple  0   Live Births  1           Social History: Social History   Socioeconomic History  . Marital status: Married    Spouse name: Not on file  . Number of children: Not on file  . Years of education: Not on file  . Highest education level: Not on file  Occupational History  . Not on file  Social Needs  . Financial resource strain: Not on file  . Food insecurity:    Worry: Not on file    Inability: Not on file  . Transportation needs:    Medical: Not on file    Non-medical: Not on file  Tobacco Use  . Smoking status: Never Smoker  . Smokeless tobacco: Never Used  Substance and Sexual Activity  . Alcohol use: No  . Drug use: No  . Sexual activity: Yes    Birth  control/protection: None  Lifestyle  . Physical activity:    Days per week: Not on file    Minutes per session: Not on file  . Stress: Not on file  Relationships  . Social connections:    Talks on phone: Not on file    Gets together: Not on file    Attends religious service: Not on file    Active member of club or organization: Not on file    Attends meetings of clubs or organizations: Not on file    Relationship status: Not on file  Other Topics Concern  . Not on file  Social History Narrative  . Not on file    Family History: Family History  Problem Relation Age of Onset  . Diabetes Mother   . Diabetes Paternal Grandmother   . Hypertension Paternal Grandmother     Allergies: No Known Allergies  Medications Prior to Admission  Medication Sig Dispense Refill Last Dose  . aspirin 81 MG chewable tablet Chew 1 tablet (81 mg total) by mouth daily. 30 tablet 12 Taking  . folic acid (FOLVITE) 1 MG tablet Take 1 mg by mouth daily.   Taking  . Prenatal-Fe  Fum-Methf-FA w/o A (VITAFOL-NANO) 18-0.6-0.4 MG TABS Take 1 tablet by mouth daily. 30 tablet 12 Taking  . Vitamin D, Ergocalciferol, (DRISDOL) 50000 units CAPS capsule Take 1 capsule (50,000 Units total) by mouth every 7 (seven) days. 30 capsule 2 Taking     Review of Systems   All systems reviewed and negative except as stated in HPI  Last menstrual period 01/14/2018, currently breastfeeding. General appearance: alert, cooperative and no distress Lungs: regular rate and effort Heart: regular rate  Abdomen: soft, non-tender Extremities: Homans sign is negative, no sign of DVT Presentation: cephalic Fetal monitoringBaseline: 140s bpm, Variability: Good {> 6 bpm), Accelerations: Reactive and Decelerations: Absent Uterine activity: None  Prenatal labs: ABO, Rh: B/Positive/-- (06/27 1330) Antibody: Negative (06/27 1330) Rubella: 18.10 (06/27 1330) RPR: Non Reactive (09/19 1015)  HBsAg: Negative (06/27 1330)  HIV: Non  Reactive (09/19 1015)  GBS:  Positive Ucx with resistance to PCN and clinda  2 hr GTT normal  Prenatal Transfer Tool  Maternal Diabetes: No Genetic Screening: Normal Maternal Ultrasounds/Referrals: Normal Fetal Ultrasounds or other Referrals:  None Maternal Substance Abuse:  No Significant Maternal Medications:  Meds include: Other: Aspirin Significant Maternal Lab Results: Lab values include: Group B Strep negative  No results found for this or any previous visit (from the past 24 hour(s)).  Patient Active Problem List   Diagnosis Date Noted  . Encounter for induction of labor 10/09/2018  . History of stillbirth 08/25/2018  . Positive GBS test 05/13/2018  . History of preterm delivery 05/01/2018  . Maternal varicella, non-immune 02/07/2017  . Non-English speaking patient 02/06/2017  . Lichen sclerosus 02/06/2017  . 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 Hx of preeclampsia, prior pregnancy, currently pregnant 02/06/2017  . Supervision of high risk pregnancy, antepartum 02/05/2017    Assessment: Jacqueline Irwin is a 27 y.o. female [phone] with IUP at [redacted]w[redacted]d presenting for IOL in the setting of NRNST.  1. Labor: No active labor at this time. 2. FWB: Reactive, reassuring NST following fluid bolus at admission. 3. Pain: None at this time. Desires epidural. 4. GBS: History of GBS urine with resistance to clindamycin and PCN. 5. Hx of PreE: Pressures normal throughout this pregnancy and at admission.   Plan: 1. Labor: Cytotec 50 mcg buccal Q4H, recheck cervical exam prior to next dose. 2. FWB: Continuous monitor. 3. Pain: IV pain medication and epidural PRN.  4. GBS: Vancomycin 1000 mg Q12H 5. Hx of PreE: Continue to monitor pressures through postpartum period.   Awilda Metro, MD  10/09/2018, 1:41 PM  CNM attestation:  I have seen and examined this patient; I agree with above documentation in the resident's note.   Jacqueline Irwin is a 27 y.o. [phone]  here for IOL for NRNST  PE: BP 106/62   Pulse 87   Temp 98.9 F (37.2 C) (Oral)   Resp 16   Ht 5\' 4"  (1.626 m)   Wt 78.5 kg   LMP 01/14/2018 (Exact Date)   BMI 29.70 kg/m  Gen: calm comfortable, NAD Resp: normal effort, no distress Abd: gravid  ROS, labs, PMH reviewed  Plan: Admit to [facility] cx ripening with cytotec, cervical foley, then Pit/AROM prn Anticipate SVD  Arabella Merles CNM 10/09/2018, 8:27 PM

## 2018-10-09 NOTE — Progress Notes (Signed)
LABOR PROGRESS NOTE  Jacqueline Irwin is a 27 y.o. [phone] at 7252w2d  admitted for IOL 2/2 NRST  Subjective: Patient is resting in bed. She states that she is doing alright. She states that the pain medications are helping with her contraction pain which has been steadily increasing.   Objective: BP 106/62   Pulse 87   Temp 98.9 F (37.2 C) (Oral)   Resp 16   Ht 5\' 4"  (1.626 m)   Wt 78.5 kg   LMP 01/14/2018 (Exact Date)   BMI 29.70 kg/m  or  Vitals:   10/09/18 1340 10/09/18 1800 10/09/18 1857 10/09/18 2000  BP: 120/70 115/66 111/70 106/62  Pulse: 80 70 99 87  Resp: 15 16 16 16   Temp: 98 F (36.7 C)   98.9 F (37.2 C)  TempSrc: Oral   Oral  Weight: 78.5 kg     Height: 5\' 4"  (1.626 m)       Appears comfortable. Resting in right lateral position ~1900 Dilation: Fingertip Effacement (%): 50 Cervical Position: Middle Station: -3 Presentation: Vertex Exam by:: Jacqueline Irwin RNC FHT: baseline rate 140, mild to moderate variability which was improved with lactated ringer bolus given at about 2110 , rare acel, rare variable decel Toco: 1-4 minutes moderate  Labs: Lab Results  Component Value Date   WBC 8.6 10/09/2018   HGB 10.1 (L) 10/09/2018   HCT 32.3 (L) 10/09/2018   MCV 79.0 (L) 10/09/2018   PLT 219 10/09/2018    Patient Active Problem List   Diagnosis Date Noted  . Encounter for induction of labor 10/09/2018  . History of stillbirth 08/25/2018  . Positive GBS test 05/13/2018  . History of preterm delivery 05/01/2018  . Maternal varicella, non-immune 02/07/2017  . Non-English speaking patient 02/06/2017  . Lichen sclerosus 02/06/2017  . 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 Hx of preeclampsia, prior pregnancy, currently pregnant 02/06/2017  . Supervision of high risk pregnancy, antepartum 02/05/2017    Assessment / Plan: 27 y.o. [id number] at 9352w2d here for IOL for NRST. Patient is having increased pain with contractions which is improved with IV pain  medication.   Labor: SROM ~2040- clear, moderate volume, normal odor. Plan to perform cervical check ~2300.Vancomycin infusion for GBS ppx. cytotec buccal given at 1445, and 1900. Fetal Wellbeing:  Category II- mild variability which improved to moderate s/p fluid bolus. Small variable decels s/p SROM which also improved with fluids. Baseline heart rate 140. Pain Control:  fentanyl given ~2030.  Anticipated MOD:  vaginal  Jacqueline Rushinghelsey Jamiaya Bina DO 10/09/2018, 9:21 PM

## 2018-10-10 ENCOUNTER — Encounter (HOSPITAL_COMMUNITY): Payer: Self-pay

## 2018-10-10 DIAGNOSIS — Z3A38 38 weeks gestation of pregnancy: Secondary | ICD-10-CM

## 2018-10-10 DIAGNOSIS — O99824 Streptococcus B carrier state complicating childbirth: Secondary | ICD-10-CM

## 2018-10-10 LAB — CBC
HCT: 27.8 % — ABNORMAL LOW (ref 36.0–46.0)
Hemoglobin: 8.9 g/dL — ABNORMAL LOW (ref 12.0–15.0)
MCH: 25.1 pg — ABNORMAL LOW (ref 26.0–34.0)
MCHC: 32 g/dL (ref 30.0–36.0)
MCV: 78.3 fL — ABNORMAL LOW (ref 80.0–100.0)
Platelets: 160 10*3/uL (ref 150–400)
RBC: 3.55 MIL/uL — ABNORMAL LOW (ref 3.87–5.11)
RDW: 14.3 % (ref 11.5–15.5)
WBC: 10.1 10*3/uL (ref 4.0–10.5)
nRBC: 0 % (ref 0.0–0.2)

## 2018-10-10 LAB — RPR: RPR: NONREACTIVE

## 2018-10-10 MED ORDER — PRENATAL MULTIVITAMIN CH
1.0000 | ORAL_TABLET | Freq: Every day | ORAL | Status: DC
  Administered 2018-10-10 – 2018-10-11 (×2): 1 via ORAL
  Filled 2018-10-10 (×2): qty 1

## 2018-10-10 MED ORDER — SIMETHICONE 80 MG PO CHEW: 80.0000 mg | CHEWABLE_TABLET | ORAL | Status: DC | PRN

## 2018-10-10 MED ORDER — SENNOSIDES-DOCUSATE SODIUM 8.6-50 MG PO TABS
2.0000 | ORAL_TABLET | ORAL | Status: DC
  Administered 2018-10-10: 2 via ORAL
  Filled 2018-10-10: qty 2

## 2018-10-10 MED ORDER — DIPHENHYDRAMINE HCL 25 MG PO CAPS: 25.0000 mg | ORAL_CAPSULE | Freq: Four times a day (QID) | ORAL | Status: DC | PRN

## 2018-10-10 MED ORDER — BENZOCAINE-MENTHOL 20-0.5 % EX AERO
1.0000 "application " | INHALATION_SPRAY | CUTANEOUS | Status: DC | PRN
  Administered 2018-10-10: 1 via TOPICAL
  Filled 2018-10-10: qty 56

## 2018-10-10 MED ORDER — WITCH HAZEL-GLYCERIN EX PADS: 1.0000 "application " | MEDICATED_PAD | CUTANEOUS | Status: DC | PRN

## 2018-10-10 MED ORDER — ONDANSETRON HCL 4 MG PO TABS: 4.0000 mg | ORAL_TABLET | ORAL | Status: DC | PRN

## 2018-10-10 MED ORDER — COCONUT OIL OIL: 1.0000 "application " | TOPICAL_OIL | Status: DC | PRN

## 2018-10-10 MED ORDER — TETANUS-DIPHTH-ACELL PERTUSSIS 5-2.5-18.5 LF-MCG/0.5 IM SUSP: 0.5000 mL | Freq: Once | INTRAMUSCULAR | Status: DC

## 2018-10-10 MED ORDER — DIBUCAINE 1 % RE OINT: 1.0000 "application " | TOPICAL_OINTMENT | RECTAL | Status: DC | PRN

## 2018-10-10 MED ORDER — IBUPROFEN 600 MG PO TABS
600.0000 mg | ORAL_TABLET | Freq: Four times a day (QID) | ORAL | Status: DC
  Administered 2018-10-10 – 2018-10-11 (×6): 600 mg via ORAL
  Filled 2018-10-10 (×6): qty 1

## 2018-10-10 MED ORDER — ONDANSETRON HCL 4 MG/2ML IJ SOLN: 4.0000 mg | INTRAMUSCULAR | Status: DC | PRN

## 2018-10-10 MED ORDER — ACETAMINOPHEN 325 MG PO TABS
650.0000 mg | ORAL_TABLET | ORAL | Status: DC | PRN
  Administered 2018-10-10 – 2018-10-11 (×4): 650 mg via ORAL
  Filled 2018-10-10 (×3): qty 2

## 2018-10-10 MED ORDER — ZOLPIDEM TARTRATE 5 MG PO TABS: 5.0000 mg | ORAL_TABLET | Freq: Every evening | ORAL | Status: DC | PRN

## 2018-10-10 NOTE — Anesthesia Postprocedure Evaluation (Signed)
Anesthesia Post Note  Patient: Jacqueline Irwin  Procedure(s) Performed: AN AD HOC LABOR EPIDURAL     Patient location during evaluation: Mother Baby Anesthesia Type: Epidural Level of consciousness: awake and alert Pain management: pain level controlled Vital Signs Assessment: post-procedure vital signs reviewed and stable Respiratory status: spontaneous breathing, nonlabored ventilation and respiratory function stable Cardiovascular status: stable Postop Assessment: no headache, no backache and epidural receding Anesthetic complications: no    Last Vitals:  Vitals:   10/10/18 0930 10/10/18 1312  BP: 109/83 107/70  Pulse: 88 88  Resp: 16 16  Temp: 36.7 C 36.7 C  SpO2: 100% 100%    Last Pain:  Vitals:   10/10/18 1312  TempSrc: Oral  PainSc:    Pain Goal:                 [name]

## 2018-10-10 NOTE — Progress Notes (Signed)
Care through delivery with assistance by Blane OharaAluha, [employer] interpreter, at bedside.

## 2018-10-10 NOTE — Lactation Note (Signed)
This note was copied from a baby's chart. Lactation Consultation Note  Patient Name: Jacqueline Irwin ZOXWR'UToday's Date: 10/10/2018 Reason for consult: Initial assessment;Early term 37-38.6wks  Used Kennyth Lose[employer] LanguageEdmon Crape interpreter for Arabic; interpreter Soha # [phone]  2812 hours old early term female who is being exclusively BF by her mother, she's a P2 and experienced BF. Mom was able to BF her first child for 9 months and she's already familiar with hand expression, a small drop of colostrum was observed when mom hand expressed prior latching baby. [facility] She doesn't have a DEBP at home, [facility], instructions, cleaning and storage were reviewed as well as milk storage guidelines.  Baby was asleep when entering the room, but as the lactation assessment progressed she started cueing. LC offered assistance with latch, mom agreed but she would not unswadle baby; the interpreter had to ask her twice. Mom didn't do a full STS, she was wearing a long sleeve dress and only took one of her breast out for baby to feed.  LC took baby in cross cradle hold to the left breast and she was able to latch right away with a few audible swallows heard upon breast compressions. Baby fed for 12 minutes before falling asleep at the breast, mom covered her with a blanket. Discussed cluster feeding; size of baby stomach and lactogenesis II. Mom also had questions about her diet, she's planning on cut back on some foods in order to lose weight, advised mom to eat a healthy diet and to keep her hydration level optimal which is crucial for her milk supply.  Feeding plan:   1. Encouraged mom to feed baby STS 8-12 times/24 hours or sooner if feeding cues are present. 2. Hand expression and finger feeding was also encouraged  BF brochure, BF resources and feeding diary were reviewed. Mom reported all questions and concerns were answered, she's aware of LC  services and will call PRN.  Maternal Data Formula Feeding for Exclusion: No Has patient been taught Hand Expression?: Yes Does the patient have breastfeeding experience prior to this delivery?: Yes  Feeding Feeding Type: Breast Fed  LATCH Score Latch: Grasps breast easily, tongue down, lips flanged, rhythmical sucking.  Audible Swallowing: A few with stimulation  Type of Nipple: Everted at rest and after stimulation  Comfort (Breast/Nipple): Soft / non-tender  Hold (Positioning): Assistance needed to correctly position infant at breast and maintain latch.  LATCH Score: 8  Interventions Interventions: Breast feeding basics reviewed;Assisted with latch;Skin to skin;Breast massage;Hand pump;Support pillows;Breast compression;Adjust position;Hand express  Lactation Tools Discussed/Used Tools: Pump Breast pump type: Manual WIC Program: Yes Pump Review: Setup, frequency, and cleaning;Milk Storage Initiated by:: MPeck Date initiated:: 10/10/18   Consult Status Consult Status: Follow-up Date: 10/11/18 Follow-up type: In-patient    Mayda Shippee Venetia ConstableS Dazia Lippold 10/10/2018, 2:54 PM

## 2018-10-11 ENCOUNTER — Encounter (HOSPITAL_COMMUNITY): Payer: Self-pay | Admitting: *Deleted

## 2018-10-11 ENCOUNTER — Other Ambulatory Visit: Payer: Self-pay

## 2018-10-11 MED ORDER — IBUPROFEN 600 MG PO TABS: 600.0000 mg | ORAL_TABLET | Freq: Four times a day (QID) | ORAL | 0 refills | Status: DC | PRN

## 2018-10-11 NOTE — Discharge Summary (Signed)
OB Discharge Summary     Patient Name: Jacqueline Irwin DOB: December 21, 1990 MRN: [id number]  Date of admission: 10/09/2018 Delivering MD: Leeroy BockANDERSON, CHELSEY L   Date of discharge: 10/11/2018  Admitting diagnosis: 38.2WKS INDUCTION Intrauterine pregnancy: 659w3d     Secondary diagnosis:  Active Problems:   Supervision of high risk pregnancy, antepartum   Non-English speaking patient   Hx of preeclampsia, prior pregnancy, currently pregnant   Positive GBS test   History of stillbirth   Encounter for induction of labor  Additional problems: non-reactive NST     Discharge diagnosis: Term Pregnancy Delivered                                                                                                Post partum procedures:none  Augmentation: Cytotec  Complications: None  Hospital course:  Induction of Labor With Vaginal Delivery   27 y.o. yo [id number] at 169w3d was admitted to the hospital 10/09/2018 for induction of labor after being seen in the office with a non reactive NST with 10x10 accels, no decels. She had an 8/10 BPP on 12/2, and an IOL was scheduled for the future due to hx of IUFD. This was moved to 12/5 due to the NST. Indication for induction: non reactive NST.  Patient had an uncomplicated labor course induced with only cytotec. She had Vanc x 1 dose due to GBS bacteruria, resistant to PCN and Clinda at her NOB. Membrane Rupture Time/Date: 8:40 PM ,10/09/2018   Intrapartum Procedures: Episiotomy: None [1]                                         Lacerations:  1st degree [2];Perineal [11]  Patient had delivery of a Viable infant.  Information for the patient's newborn:  Jacqueline Irwin, [name] [[id number]][[id number]]  Delivery Method: Vaginal, Spontaneous(Filed from Delivery Summary)   10/10/2018  Details of delivery can be found in separate delivery note.  Patient had a routine postpartum course. Patient is discharged home 10/11/18.  Physical exam  Vitals:   10/10/18 1312 10/10/18 1744  10/10/18 2150 10/11/18 0524  BP: 107/70 (!) 128/91 113/78 103/66  Pulse: 88 94 85 83  Resp: 16 17 17 16   Temp: 98 F (36.7 C) 98.6 F (37 C) 97.9 F (36.6 C) 97.7 F (36.5 C)  TempSrc: Oral Oral Oral Oral  SpO2: 100% 100%  100%  Weight:      Height:       General: alert and cooperative Lochia: appropriate Uterine Fundus: firm Incision: N/A DVT Evaluation: No evidence of DVT seen on physical exam. Labs: Lab Results  Component Value Date   WBC 10.1 10/10/2018   HGB 8.9 (L) 10/10/2018   HCT 27.8 (L) 10/10/2018   MCV 78.3 (L) 10/10/2018   PLT 160 10/10/2018   CMP Latest Ref Rng & Units 05/01/2018  Glucose 65 - 99 mg/dL 87  BUN 6 - 20 mg/dL 6  Creatinine 6.290.57 - 5.281.00 mg/dL 4.13(K0.48(L)  Sodium 440134 - 102144 mmol/L 138  Potassium 3.5 - 5.2 mmol/L 4.0  Chloride 96 - 106 mmol/L 102  CO2 20 - 29 mmol/L 22  Calcium 8.7 - 10.2 mg/dL 9.5  Total Protein 6.0 - 8.5 g/dL 6.9  Total Bilirubin 0.0 - 1.2 mg/dL <1.6  Alkaline Phos 39 - 117 IU/L 97  AST 0 - 40 IU/L 15  ALT 0 - 32 IU/L 15    Discharge instruction: per After Visit Summary and "Baby and Me Booklet".  After visit meds:  Allergies as of 10/11/2018   No Known Allergies     Medication List    STOP taking these medications   folic acid 1 MG tablet Commonly known as:  FOLVITE     TAKE these medications   aspirin 81 MG chewable tablet Chew 1 tablet (81 mg total) by mouth daily.   ibuprofen 600 MG tablet Commonly known as:  ADVIL,MOTRIN Take 1 tablet (600 mg total) by mouth every 6 (six) hours as needed.   VITAFOL-NANO 18-0.6-0.4 MG Tabs Take 1 tablet by mouth daily.   Vitamin D (Ergocalciferol) 1.25 MG (50000 UT) Caps capsule Commonly known as:  DRISDOL Take 1 capsule (50,000 Units total) by mouth every 7 (seven) days.       Diet: routine diet  Activity: Advance as tolerated. Pelvic rest for 6 weeks.   Outpatient follow up:4 weeks Follow up Appt: Future Appointments  Date Time Provider Department Center   11/12/2018  1:15 PM Constant, Gigi Gin, MD [facility] None   Follow up Visit:No follow-ups on file.  Postpartum contraception: IUD Mirena vs DMPA  Newborn Data: Live born female  Birth Weight: 6 lb 1.2 oz (2756 g) APGAR: 8, 9  Newborn Delivery   Birth date/time:  10/10/2018 02:23:00 Delivery type:  Vaginal, Spontaneous     Baby Feeding: Breast Disposition:home with mother   10/11/2018 Jacqueline Irwin, CNM  9:14 AM

## 2018-10-11 NOTE — Discharge Instructions (Signed)
Vaginal Delivery, Care After °Refer to this sheet in the next few weeks. These instructions provide you with information about caring for yourself after vaginal delivery. Your health care provider may also give you more specific instructions. Your treatment has been planned according to current medical practices, but problems sometimes occur. Call your health care provider if you have any problems or questions. °What can I expect after the procedure? °After vaginal delivery, it is common to have: °· Some bleeding from your vagina. °· Soreness in your abdomen, your vagina, and the area of skin between your vaginal opening and your anus (perineum). °· Pelvic cramps. °· Fatigue. ° °Follow these instructions at home: °Medicines °· Take over-the-counter and prescription medicines only as told by your health care provider. °· If you were prescribed an antibiotic medicine, take it as told by your health care provider. Do not stop taking the antibiotic until it is finished. °Driving ° °· Do not drive or operate heavy machinery while taking prescription pain medicine. °· Do not drive for 24 hours if you received a sedative. °Lifestyle °· Do not drink alcohol. This is especially important if you are breastfeeding or taking medicine to relieve pain. °· Do not use tobacco products, including cigarettes, chewing tobacco, or e-cigarettes. If you need help quitting, ask your health care provider. °Eating and drinking °· Drink at least 8 eight-ounce glasses of water every day unless you are told not to by your health care provider. If you choose to breastfeed your baby, you may need to drink more water than this. °· Eat high-fiber foods every day. These foods may help prevent or relieve constipation. High-fiber foods include: °? Whole grain cereals and breads. °? Brown rice. °? Beans. °? Fresh fruits and vegetables. °Activity °· Return to your normal activities as told by your health care provider. Ask your health care provider  what activities are safe for you. °· Rest as much as possible. Try to rest or take a nap when your baby is sleeping. °· Do not lift anything that is heavier than your baby or 10 lb (4.5 kg) until your health care provider says that it is safe. °· Talk with your health care provider about when you can engage in sexual activity. This may depend on your: °? Risk of infection. °? Rate of healing. °? Comfort and desire to engage in sexual activity. °Vaginal Care °· If you have an episiotomy or a vaginal tear, check the area every day for signs of infection. Check for: °? More redness, swelling, or pain. °? More fluid or blood. °? Warmth. °? Pus or a bad smell. °· Do not use tampons or douches until your health care provider says this is safe. °· Watch for any blood clots that may pass from your vagina. These may look like clumps of dark red, brown, or black discharge. °General instructions °· Keep your perineum clean and dry as told by your health care provider. °· Wear loose, comfortable clothing. °· Wipe from front to back when you use the toilet. °· Ask your health care provider if you can shower or take a bath. If you had an episiotomy or a perineal tear during labor and delivery, your health care provider may tell you not to take baths for a certain length of time. °· Wear a bra that supports your breasts and fits you well. °· If possible, have someone help you with household activities and help care for your baby for at least a few days after   you leave the hospital. °· Keep all follow-up visits for you and your baby as told by your health care provider. This is important. °Contact a health care provider if: °· You have: °? Vaginal discharge that has a bad smell. °? Difficulty urinating. °? Pain when urinating. °? A sudden increase or decrease in the frequency of your bowel movements. °? More redness, swelling, or pain around your episiotomy or vaginal tear. °? More fluid or blood coming from your episiotomy or  vaginal tear. °? Pus or a bad smell coming from your episiotomy or vaginal tear. °? A fever. °? A rash. °? Little or no interest in activities you used to enjoy. °? Questions about caring for yourself or your baby. °· Your episiotomy or vaginal tear feels warm to the touch. °· Your episiotomy or vaginal tear is separating or does not appear to be healing. °· Your breasts are painful, hard, or turn red. °· You feel unusually sad or worried. °· You feel nauseous or you vomit. °· You pass large blood clots from your vagina. If you pass a blood clot from your vagina, save it to show to your health care provider. Do not flush blood clots down the toilet without having your health care provider look at them. °· You urinate more than usual. °· You are dizzy or light-headed. °· You have not breastfed at all and you have not had a menstrual period for 12 weeks after delivery. °· You have stopped breastfeeding and you have not had a menstrual period for 12 weeks after you stopped breastfeeding. °Get help right away if: °· You have: °? Pain that does not go away or does not get better with medicine. °? Chest pain. °? Difficulty breathing. °? Blurred vision or spots in your vision. °? Thoughts about hurting yourself or your baby. °· You develop pain in your abdomen or in one of your legs. °· You develop a severe headache. °· You faint. °· You bleed from your vagina so much that you fill two sanitary pads in one hour. °This information is not intended to replace advice given to you by your health care provider. Make sure you discuss any questions you have with your health care provider. °Document Released: 10/19/2000 Document Revised: 04/04/2016 Document Reviewed: 11/06/2015 °Elsevier Interactive Patient Education © 2018 [employer] Inc[employer] ° °

## 2018-10-13 ENCOUNTER — Encounter: Payer: Medicaid Other | Admitting: Obstetrics and Gynecology

## 2018-10-13 ENCOUNTER — Other Ambulatory Visit: Payer: Medicaid Other

## 2018-10-14 ENCOUNTER — Inpatient Hospital Stay (HOSPITAL_COMMUNITY): Admission: RE | Admit: 2018-10-14 | Payer: Medicaid Other | Source: Ambulatory Visit

## 2018-10-16 ENCOUNTER — Encounter: Payer: Medicaid Other | Admitting: Obstetrics and Gynecology

## 2018-10-20 ENCOUNTER — Other Ambulatory Visit: Payer: Medicaid Other

## 2018-11-12 ENCOUNTER — Ambulatory Visit (INDEPENDENT_AMBULATORY_CARE_PROVIDER_SITE_OTHER): Payer: Medicaid Other | Admitting: Obstetrics and Gynecology

## 2018-11-12 ENCOUNTER — Encounter: Payer: Self-pay | Admitting: Obstetrics and Gynecology

## 2018-11-12 DIAGNOSIS — Z1389 Encounter for screening for other disorder: Secondary | ICD-10-CM | POA: Diagnosis not present

## 2018-11-12 MED ORDER — NORETHINDRONE 0.35 MG PO TABS: 1.0000 | ORAL_TABLET | Freq: Every day | ORAL | 11 refills | Status: DC

## 2018-11-12 NOTE — Progress Notes (Signed)
Post Partum Exam  Jacqueline Irwin is a 28 y.o. [phone] female who presents for a postpartum visit. She is 1 month postpartum following a spontaneous vaginal delivery. I have fully reviewed the prenatal and intrapartum course. The delivery was at 101w3d gestational weeks.  Anesthesia: epidural. Postpartum course has been Unremarkable. Baby's course has been Unremarkable. Baby is feeding by breast. Bleeding no bleeding. Bowel function is normal. Bladder function is normal. Patient is not sexually active. Contraception method is none. Pt requests BCP. Postpartum depression screening:neg. Used Translator [phone] Jacqueline Irwin and [id number] Jacqueline Irwin     Last pap smear done 02/2017 and was Normal  Review of Systems Pertinent items are noted in HPI.    Objective:  unknown if currently breastfeeding.  General:  alert, cooperative and no distress   Breasts:  inspection negative, no nipple discharge or bleeding, no masses or nodularity palpable  Lungs: clear to auscultation bilaterally  Heart:  regular rate and rhythm  Abdomen: soft, non-tender; bowel sounds normal; no masses,  no organomegaly   Vulva:  normal  Vagina: normal vagina, no discharge, exudate, lesion, or erythema  Cervix:  multiparous appearance  Corpus: normal size, contour, position, consistency, mobility, non-tender  Adnexa:  normal adnexa and no mass, fullness, tenderness  Rectal Exam: Not performed.        Assessment:    Normal postpartum exam. Pap smear not done at today's visit.   Plan:   1. Contraception: oral progesterone-only contraceptive 2. Patient is medically cleared to resume all activities of daily living 3. Follow up in: 6 months for annual exam or as needed.

## 2019-10-20 ENCOUNTER — Other Ambulatory Visit: Payer: Self-pay

## 2019-10-20 ENCOUNTER — Encounter: Payer: Self-pay | Admitting: Obstetrics & Gynecology

## 2019-10-20 ENCOUNTER — Ambulatory Visit (INDEPENDENT_AMBULATORY_CARE_PROVIDER_SITE_OTHER): Payer: Medicaid Other | Admitting: Obstetrics & Gynecology

## 2019-10-20 ENCOUNTER — Encounter: Payer: Self-pay | Admitting: *Deleted

## 2019-10-20 DIAGNOSIS — O0990 Supervision of high risk pregnancy, unspecified, unspecified trimester: Secondary | ICD-10-CM

## 2019-10-20 DIAGNOSIS — Z3481 Encounter for supervision of other normal pregnancy, first trimester: Secondary | ICD-10-CM | POA: Diagnosis not present

## 2019-10-20 DIAGNOSIS — Z23 Encounter for immunization: Secondary | ICD-10-CM | POA: Diagnosis not present

## 2019-10-20 DIAGNOSIS — O0992 Supervision of high risk pregnancy, unspecified, second trimester: Secondary | ICD-10-CM

## 2019-10-20 DIAGNOSIS — Z3A15 15 weeks gestation of pregnancy: Secondary | ICD-10-CM

## 2019-10-20 HISTORY — DX: Supervision of high risk pregnancy, unspecified, unspecified trimester: O09.90

## 2019-10-20 MED ORDER — BLOOD PRESSURE CUFF MISC: 1.0000 | 0 refills | Status: DC

## 2019-10-20 NOTE — Progress Notes (Signed)
  Subjective:    Jacqueline Irwin is being seen today for her first obstetrical visit. She is a 28 yo marriedG5P2.  This is a planned pregnancy. She is at [redacted]w[redacted]d gestation. Her obstetrical history is significant for obesity and h/o pre eclampsia with her first pregnancy, h/o stillbirth at 8 months with second baby. She speaks Arabid. . Relationship with FOB: spouse, living together. Patient does intend to breast feed. Pregnancy history fully reviewed.  Patient reports heartburn.  Review of Systems:   Review of Systems  Objective:     BP 119/78   Pulse 98   Wt 183 lb 8 oz (83.2 kg)   LMP 07/06/2019 (Exact Date)   BMI 31.50 kg/m  Physical Exam  Exam    Assessment:    Pregnancy: T2W5809 Patient Active Problem List   Diagnosis Date Noted  . Supervision of high risk pregnancy, antepartum, unspecified trimester 10/20/2019  . Encounter for induction of labor 10/09/2018  . History of stillbirth 08/25/2018  . Positive GBS test 05/13/2018  . Maternal varicella, non-immune 02/07/2017  . Non-English speaking patient 02/06/2017  . Lichen sclerosus 98/33/8250  . 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 Hx of preeclampsia, prior pregnancy, currently pregnant 02/06/2017       Plan:     Initial labs drawn. Prenatal vitamins. Problem list reviewed and updated. NIPS requested Role of ultrasound in pregnancy discussed; fetal survey: ordered. Amniocentesis discussed: not indicated. Follow up in 4 weeks. She is taking PNVs and baby asa. I have prescribed protonix for her heartburn.  Emily Filbert 10/20/2019

## 2019-10-20 NOTE — Progress Notes (Signed)
Pt is here for initial prenatal visit. LMP 07/06/19. EDD 04/11/20

## 2019-10-21 LAB — OBSTETRIC PANEL, INCLUDING HIV
Antibody Screen: NEGATIVE
Basophils Absolute: 0 10*3/uL (ref 0.0–0.2)
Basos: 0 %
EOS (ABSOLUTE): 0.2 10*3/uL (ref 0.0–0.4)
Eos: 3 %
HIV Screen 4th Generation wRfx: NONREACTIVE
Hematocrit: 32.9 % — ABNORMAL LOW (ref 34.0–46.6)
Hemoglobin: 11 g/dL — ABNORMAL LOW (ref 11.1–15.9)
Hepatitis B Surface Ag: NEGATIVE
Immature Grans (Abs): 0 10*3/uL (ref 0.0–0.1)
Immature Granulocytes: 0 %
Lymphocytes Absolute: 2 10*3/uL (ref 0.7–3.1)
Lymphs: 27 %
MCH: 27.8 pg (ref 26.6–33.0)
MCHC: 33.4 g/dL (ref 31.5–35.7)
MCV: 83 fL (ref 79–97)
Monocytes Absolute: 0.4 10*3/uL (ref 0.1–0.9)
Monocytes: 6 %
Neutrophils Absolute: 4.6 10*3/uL (ref 1.4–7.0)
Neutrophils: 64 %
Platelets: 216 10*3/uL (ref 150–450)
RBC: 3.96 x10E6/uL (ref 3.77–5.28)
RDW: 14 % (ref 11.7–15.4)
RPR Ser Ql: NONREACTIVE
Rh Factor: POSITIVE
Rubella Antibodies, IGG: 15.2 index (ref >0.99)
WBC: 7.3 10*3/uL (ref 3.4–10.8)

## 2019-10-21 LAB — PROTEIN / CREATININE RATIO, URINE
Creatinine, Urine: 119.3 mg/dL
Protein, Ur: 8.9 mg/dL
Protein/Creat Ratio: 75 mg/g creat (ref 0–200)

## 2019-10-21 LAB — HEMOGLOBIN A1C
Est. average glucose Bld gHb Est-mCnc: 103 mg/dL
Hgb A1c MFr Bld: 5.2 % (ref 4.8–5.6)

## 2019-10-21 LAB — TSH: TSH: 0.944 u[IU]/mL (ref 0.450–4.500)

## 2019-10-21 LAB — COMPREHENSIVE METABOLIC PANEL
ALT: 20 IU/L (ref 0–32)
AST: 15 IU/L (ref 0–40)
Albumin/Globulin Ratio: 1.1 — ABNORMAL LOW (ref 1.2–2.2)
Albumin: 3.4 g/dL — ABNORMAL LOW (ref 3.9–5.0)
Alkaline Phosphatase: 95 IU/L (ref 39–117)
BUN/Creatinine Ratio: 9 (ref 9–23)
BUN: 7 mg/dL (ref 6–20)
Bilirubin Total: <0.2 mg/dL (ref 0.0–1.2)
CO2: 20 mmol/L (ref 20–29)
Calcium: 9.1 mg/dL (ref 8.7–10.2)
Chloride: 107 mmol/L — ABNORMAL HIGH (ref 96–106)
Creatinine, Ser: 0.74 mg/dL (ref 0.57–1.00)
GFR calc Af Amer: 126 mL/min/{1.73_m2} (ref >59)
GFR calc non Af Amer: 109 mL/min/{1.73_m2} (ref >59)
Globulin, Total: 3 g/dL (ref 1.5–4.5)
Glucose: 92 mg/dL (ref 65–99)
Potassium: 4.9 mmol/L (ref 3.5–5.2)
Sodium: 139 mmol/L (ref 134–144)
Total Protein: 6.4 g/dL (ref 6.0–8.5)

## 2019-10-26 ENCOUNTER — Encounter: Payer: Self-pay | Admitting: Obstetrics & Gynecology

## 2019-11-02 ENCOUNTER — Encounter: Payer: Self-pay | Admitting: Obstetrics & Gynecology

## 2019-11-06 NOTE — L&D Delivery Note (Signed)
OB/GYN Faculty Practice Delivery Note  Jacqueline Irwin is a 29 y.o. [id number] s/p VD at [redacted]w[redacted]d. She was admitted for IOL for hx IUFD.   ROM: 0h 29m with clear fluid GBS Status: Positive/-- (05/12 1043) Maximum Maternal Temperature: 97.59F  Labor Progress: . Initial SVE: 1.5/thick/-3. Patient received Cytotec, Foley balloon and Pitocin. Received epidural. SROM occurred. She then progressed to complete.   Delivery Date/Time: 5/24 @ 2025 Delivery: Called to room and patient was complete and pushing. Head delivered ROA. No nuchal cord present. Shoulder and body delivered in usual fashion. Infant with spontaneous cry, placed on mother's abdomen, dried and stimulated. Cord clamped x 2 after 1-minute delay, and cut by FOB. Cord blood drawn. Placenta delivered spontaneously with gentle cord traction. Fundus firm with massage and Pitocin. Labia, perineum, vagina, and cervix inspected inspected with first degree vaginal laceration.  Baby Weight: pending  Placenta: Sent to L&D Complications: None Lacerations: first degree EBL: 80 mL Analgesia: Epidural   Infant:  APGAR (1 MIN): 8   APGAR (5 MINS): 9   APGAR (10 MINS):     [name], MD [facility] Family Medicine Fellow, [facility] for [facility], [facility] 03/28/2020, 9:23 PM

## 2019-11-17 ENCOUNTER — Ambulatory Visit (INDEPENDENT_AMBULATORY_CARE_PROVIDER_SITE_OTHER): Payer: Medicaid Other | Admitting: Obstetrics and Gynecology

## 2019-11-17 ENCOUNTER — Other Ambulatory Visit: Payer: Self-pay

## 2019-11-17 ENCOUNTER — Encounter: Payer: Self-pay | Admitting: Obstetrics and Gynecology

## 2019-11-17 VITALS — BP 118/75 | HR 106 | Wt 183.0 lb

## 2019-11-17 DIAGNOSIS — O09299 Supervision of pregnancy with other poor reproductive or obstetric history, unspecified trimester: Secondary | ICD-10-CM

## 2019-11-17 DIAGNOSIS — O0992 Supervision of high risk pregnancy, unspecified, second trimester: Secondary | ICD-10-CM

## 2019-11-17 DIAGNOSIS — O09292 Supervision of pregnancy with other poor reproductive or obstetric history, second trimester: Secondary | ICD-10-CM

## 2019-11-17 DIAGNOSIS — Z3A19 19 weeks gestation of pregnancy: Secondary | ICD-10-CM

## 2019-11-17 DIAGNOSIS — E559 Vitamin D deficiency, unspecified: Secondary | ICD-10-CM

## 2019-11-17 DIAGNOSIS — O0990 Supervision of high risk pregnancy, unspecified, unspecified trimester: Secondary | ICD-10-CM

## 2019-11-17 DIAGNOSIS — Z789 Other specified health status: Secondary | ICD-10-CM

## 2019-11-17 DIAGNOSIS — Z8759 Personal history of other complications of pregnancy, childbirth and the puerperium: Secondary | ICD-10-CM

## 2019-11-17 MED ORDER — ASPIRIN 81 MG PO CHEW: 81.0000 mg | CHEWABLE_TABLET | Freq: Every day | ORAL | 12 refills | Status: DC

## 2019-11-17 MED ORDER — OMEPRAZOLE 20 MG PO CPDR: 20.0000 mg | DELAYED_RELEASE_CAPSULE | Freq: Two times a day (BID) | ORAL | 3 refills | Status: DC

## 2019-11-17 MED ORDER — VITAMIN D (ERGOCALCIFEROL) 1.25 MG (50000 UNIT) PO CAPS: 50000.0000 [IU] | ORAL_CAPSULE | ORAL | 2 refills | Status: DC

## 2019-11-17 MED ORDER — VITAFOL-NANO 18-0.6-0.4 MG PO TABS: 1.0000 | ORAL_TABLET | Freq: Every day | ORAL | 12 refills | Status: DC

## 2019-11-17 NOTE — Progress Notes (Signed)
   PRENATAL VISIT NOTE  Subjective:  Jacqueline Irwin is a 29 y.o. [phone] at [redacted]w[redacted]d being seen today for ongoing prenatal care.  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  Patient reports no complaints.  Contractions: Not present. Vag. Bleeding: None.   . Denies leaking of fluid.   The following portions of the patient's history were reviewed and updated as appropriate: allergies, current medications, past family history, past medical history, past social history, past surgical history and problem list.   Objective:   Vitals:   11/17/19 0857  BP: 118/75  Pulse: (!) 106  Weight: 183 lb (83 kg)    Fetal Status: Fetal Heart Rate (bpm): 147         General:  Alert, oriented and cooperative. Patient is in no acute distress.  Skin: Skin is warm and dry. No rash noted.   Cardiovascular: Normal heart rate noted  Respiratory: Normal respiratory effort, no problems with respiration noted  Abdomen: Soft, gravid, appropriate for gestational age.  Pain/Pressure: Present     Pelvic: Cervical exam deferred        Extremities: Normal range of motion.  Edema: None  Mental Status: Normal mood and affect. Normal behavior. Normal judgment and thought content.   Assessment and Plan:  Pregnancy: [id number] at [redacted]w[redacted]d 1. Supervision of high risk pregnancy, antepartum, unspecified trimester Patient is doing well without complaints AFP today Anatomy ultrasound on 11/19/19  2. History of stillbirth Antenatal testing at 32 weeks and delivery by 39 weeks  3. Hx of preeclampsia, prior pregnancy, currently pregnant Continue ASA  4. Non-English speaking patient Arabic  interpreter present  Preterm labor symptoms and general obstetric precautions including but not limited to vaginal bleeding, contractions, leaking of fluid and fetal movement were reviewed in detail with the patient. Please refer to After Visit Summary for other counseling recommendations.   Return in about 4 weeks (around 12/15/2019) for Virtual, ROB, High risk.  Future Appointments  Date Time Provider Department Center  11/19/2019 10:00 AM [facility] [place] 3 WH-MFCUS MFC-US    Catalina Antigua, MD

## 2019-11-19 ENCOUNTER — Encounter (HOSPITAL_COMMUNITY): Payer: Self-pay

## 2019-11-19 ENCOUNTER — Ambulatory Visit (HOSPITAL_COMMUNITY)
Admission: RE | Admit: 2019-11-19 | Discharge: 2019-11-19 | Disposition: A | Discharge status: Home / Self Care | Payer: Medicaid Other | Source: Ambulatory Visit | Attending: Obstetrics and Gynecology | Admitting: Obstetrics and Gynecology

## 2019-11-19 ENCOUNTER — Other Ambulatory Visit: Payer: Self-pay

## 2019-11-19 ENCOUNTER — Other Ambulatory Visit (HOSPITAL_COMMUNITY): Payer: Self-pay | Admitting: *Deleted

## 2019-11-19 ENCOUNTER — Ambulatory Visit (HOSPITAL_COMMUNITY): Payer: Medicaid Other | Admitting: *Deleted

## 2019-11-19 ENCOUNTER — Other Ambulatory Visit: Payer: Self-pay | Admitting: Obstetrics & Gynecology

## 2019-11-19 DIAGNOSIS — O09212 Supervision of pregnancy with history of pre-term labor, second trimester: Secondary | ICD-10-CM

## 2019-11-19 DIAGNOSIS — Z3A19 19 weeks gestation of pregnancy: Secondary | ICD-10-CM

## 2019-11-19 DIAGNOSIS — O0990 Supervision of high risk pregnancy, unspecified, unspecified trimester: Secondary | ICD-10-CM

## 2019-11-19 DIAGNOSIS — O99212 Obesity complicating pregnancy, second trimester: Secondary | ICD-10-CM

## 2019-11-19 DIAGNOSIS — O09292 Supervision of pregnancy with other poor reproductive or obstetric history, second trimester: Secondary | ICD-10-CM

## 2019-11-19 DIAGNOSIS — Z362 Encounter for other antenatal screening follow-up: Secondary | ICD-10-CM

## 2019-11-19 LAB — AFP, SERUM, OPEN SPINA BIFIDA
AFP MoM: 1.42
AFP Value: 63.1 ng/mL
Gest. Age on Collection Date: 19.1 weeks
Maternal Age At EDD: 31 yr
OSBR Risk 1 IN: 3404
Test Results:: NEGATIVE
Weight: 183 [lb_av]

## 2019-12-15 ENCOUNTER — Encounter: Payer: Medicaid Other | Admitting: Obstetrics & Gynecology

## 2019-12-15 NOTE — Progress Notes (Signed)
Originally called mobile at 8:29 am no answer left message   Told wrong contact number was dialed call to (H) no answer left message

## 2019-12-16 ENCOUNTER — Telehealth (INDEPENDENT_AMBULATORY_CARE_PROVIDER_SITE_OTHER): Payer: Medicaid Other | Admitting: Obstetrics & Gynecology

## 2019-12-16 VITALS — BP 114/67 | HR 92

## 2019-12-16 DIAGNOSIS — Z789 Other specified health status: Secondary | ICD-10-CM

## 2019-12-16 DIAGNOSIS — Z8759 Personal history of other complications of pregnancy, childbirth and the puerperium: Secondary | ICD-10-CM | POA: Diagnosis not present

## 2019-12-16 DIAGNOSIS — O09292 Supervision of pregnancy with other poor reproductive or obstetric history, second trimester: Secondary | ICD-10-CM

## 2019-12-16 DIAGNOSIS — O09299 Supervision of pregnancy with other poor reproductive or obstetric history, unspecified trimester: Secondary | ICD-10-CM

## 2019-12-16 DIAGNOSIS — Z3A23 23 weeks gestation of pregnancy: Secondary | ICD-10-CM | POA: Diagnosis not present

## 2019-12-16 DIAGNOSIS — O0993 Supervision of high risk pregnancy, unspecified, third trimester: Secondary | ICD-10-CM | POA: Diagnosis not present

## 2019-12-16 DIAGNOSIS — O0990 Supervision of high risk pregnancy, unspecified, unspecified trimester: Secondary | ICD-10-CM

## 2019-12-16 NOTE — Progress Notes (Signed)
   TELEHEALTH VIRTUAL OBSTETRICS VISIT ENCOUNTER NOTE  I connected with Mellody Memos on 12/16/19 at  3:30 PM EST by telephone at home and verified that I am speaking with the correct person using two identifiers.   I discussed the limitations, risks, security and privacy concerns of performing an evaluation and management service by telephone and the availability of in person appointments. I also discussed with the patient that there may be a patient responsible charge related to this service. The patient expressed understanding and agreed to proceed.  Subjective:  Jacqueline Irwin is a 29 y.o. [phone] at [redacted]w[redacted]d being followed for ongoing prenatal care.  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  Patient reports no complaints. Reports fetal movement. Denies any contractions, bleeding or leaking of fluid.   The following portions of the patient's history were reviewed and updated as appropriate: allergies, current medications, past family history, past medical history, past social history, past surgical history and problem list.   Objective:   General:  Alert, oriented and cooperative.   Mental Status: Normal mood and affect perceived. Normal judgment and thought content.  Rest of physical exam deferred due to type of encounter  Assessment and Plan:  Pregnancy: T3S2876 at [redacted]w[redacted]d 1. Supervision of high risk pregnancy, antepartum, unspecified trimester Has f/u US ordered  2. Non-English speaking patient Arabic interpreter on call  3. History of stillbirth Fetal surveillance after 32 weeks  4. Hx of preeclampsia, prior pregnancy, currently  pregnant Nl BP today  Preterm labor symptoms and general obstetric precautions including but not limited to vaginal bleeding, contractions, leaking of fluid and fetal movement were reviewed in detail with the patient.  I discussed the assessment and treatment plan with the patient. The patient was provided an opportunity to ask questions and all were answered. The patient agreed with the plan and demonstrated an understanding of the instructions. The patient was advised to call back or seek an in-person office evaluation/go to MAU at [facility] for any urgent or concerning symptoms. Please refer to After Visit Summary for other counseling recommendations.   I provided 15 minutes of non-face-to-face time during this encounter.  No follow-ups on file.  Future Appointments  Date Time Provider Department Center  12/17/2019 11:10 AM [facility] NURSE WH-MFC MFC-US  12/17/2019 11:15 AM [facility] [place] 4 WH-MFCUS MFC-US  01/13/2020  8:00 AM [facility] LAB [facility] None  01/13/2020  8:55 AM Fair, Hoyle Sauer, MD [facility] None    Scheryl Darter, MD Center for [facility], [facility]

## 2019-12-16 NOTE — Patient Instructions (Signed)

## 2019-12-16 NOTE — Progress Notes (Signed)
S/w pt for virtual visit, Patient reports fetal movement, denies pain.

## 2019-12-17 ENCOUNTER — Ambulatory Visit (HOSPITAL_COMMUNITY): Payer: Medicaid Other | Admitting: *Deleted

## 2019-12-17 ENCOUNTER — Other Ambulatory Visit: Payer: Self-pay

## 2019-12-17 ENCOUNTER — Ambulatory Visit (HOSPITAL_COMMUNITY)
Admission: RE | Admit: 2019-12-17 | Discharge: 2019-12-17 | Disposition: A | Discharge status: Home / Self Care | Payer: Medicaid Other | Source: Ambulatory Visit | Attending: Obstetrics and Gynecology | Admitting: Obstetrics and Gynecology

## 2019-12-17 ENCOUNTER — Other Ambulatory Visit (HOSPITAL_COMMUNITY): Payer: Self-pay | Admitting: *Deleted

## 2019-12-17 ENCOUNTER — Encounter (HOSPITAL_COMMUNITY): Payer: Self-pay

## 2019-12-17 DIAGNOSIS — Z362 Encounter for other antenatal screening follow-up: Secondary | ICD-10-CM | POA: Diagnosis present

## 2019-12-17 DIAGNOSIS — O0990 Supervision of high risk pregnancy, unspecified, unspecified trimester: Secondary | ICD-10-CM

## 2019-12-17 DIAGNOSIS — O09212 Supervision of pregnancy with history of pre-term labor, second trimester: Secondary | ICD-10-CM

## 2019-12-17 DIAGNOSIS — Z8759 Personal history of other complications of pregnancy, childbirth and the puerperium: Secondary | ICD-10-CM

## 2019-12-17 DIAGNOSIS — Z3A23 23 weeks gestation of pregnancy: Secondary | ICD-10-CM

## 2019-12-17 DIAGNOSIS — O09292 Supervision of pregnancy with other poor reproductive or obstetric history, second trimester: Secondary | ICD-10-CM | POA: Diagnosis not present

## 2019-12-17 DIAGNOSIS — O99212 Obesity complicating pregnancy, second trimester: Secondary | ICD-10-CM | POA: Diagnosis not present

## 2019-12-17 NOTE — Progress Notes (Signed)
Patient ID: Jacqueline Irwin, female   DOB: 03-Jan-1991, 29 y.o.   MRN: [id number] Patient seen and assessed by nursing staff during this encounter. I have reviewed the chart and agree with the documentation and plan. She should reschedule her visit.  Scheryl Darter, MD 12/17/2019 8:50 AM

## 2020-01-13 ENCOUNTER — Other Ambulatory Visit: Payer: Medicaid Other

## 2020-01-13 ENCOUNTER — Ambulatory Visit (INDEPENDENT_AMBULATORY_CARE_PROVIDER_SITE_OTHER): Payer: Medicaid Other | Admitting: Family Medicine

## 2020-01-13 ENCOUNTER — Other Ambulatory Visit (HOSPITAL_COMMUNITY)
Admission: RE | Admit: 2020-01-13 | Discharge: 2020-01-13 | Disposition: A | Discharge status: Home / Self Care | Payer: Medicaid Other | Source: Ambulatory Visit | Attending: Family Medicine | Admitting: Family Medicine

## 2020-01-13 ENCOUNTER — Other Ambulatory Visit: Payer: Self-pay

## 2020-01-13 VITALS — BP 109/72 | HR 94 | Wt 178.4 lb

## 2020-01-13 DIAGNOSIS — N898 Other specified noninflammatory disorders of vagina: Secondary | ICD-10-CM

## 2020-01-13 DIAGNOSIS — B3731 Acute candidiasis of vulva and vagina: Secondary | ICD-10-CM

## 2020-01-13 DIAGNOSIS — O09299 Supervision of pregnancy with other poor reproductive or obstetric history, unspecified trimester: Secondary | ICD-10-CM

## 2020-01-13 DIAGNOSIS — O0990 Supervision of high risk pregnancy, unspecified, unspecified trimester: Secondary | ICD-10-CM

## 2020-01-13 DIAGNOSIS — B373 Candidiasis of vulva and vagina: Secondary | ICD-10-CM

## 2020-01-13 DIAGNOSIS — Z23 Encounter for immunization: Secondary | ICD-10-CM | POA: Diagnosis not present

## 2020-01-13 DIAGNOSIS — Z8759 Personal history of other complications of pregnancy, childbirth and the puerperium: Secondary | ICD-10-CM

## 2020-01-13 DIAGNOSIS — Z789 Other specified health status: Secondary | ICD-10-CM

## 2020-01-13 DIAGNOSIS — O0992 Supervision of high risk pregnancy, unspecified, second trimester: Secondary | ICD-10-CM

## 2020-01-13 NOTE — Progress Notes (Signed)
   PRENATAL VISIT NOTE  Subjective:  Jacqueline Irwin is a 29 y.o. [phone] at [redacted]w[redacted]d being seen today for ongoing prenatal care.  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  Patient reports feeling well. She has noted some itching of her outer labia. Denies vaginal discharge. Declines STD testing. Reports a yeast infection during last two pregnancies with similar symptoms.  Contractions: Irritability. Vag. Bleeding: None.  Movement: Present. Denies leaking of fluid.   The following portions of the patient's history were reviewed and updated as appropriate: allergies, current medications, past family history, past medical history, past social history, past surgical history and problem list.   Objective:   Vitals:   01/13/20 0820  BP: 109/72  Pulse: 94  Weight: 178 lb 6.4 oz (80.9 kg)    Fetal Status: Fetal Heart Rate (bpm): 149 Fundal Height: 28 cm Movement: Present     General:  Alert, oriented and cooperative. Patient is in no acute distress.  Skin: Skin is warm and dry. No rash noted.   Cardiovascular: Normal heart rate noted  Respiratory: Normal respiratory effort, no problems with respiration noted  Abdomen: Soft, gravid, appropriate for gestational age.  Pain/Pressure: Absent     Pelvic: Cervical exam deferred      Exam of external vagina and labia. No discharge noted. No excoriations or dryness noted. Swab collected.   Extremities: Normal range of motion.  Edema: None  Mental Status: Normal mood and affect. Normal behavior. Normal judgment and thought content.   Assessment and Plan:  Pregnancy: A2N0539 at [redacted]w[redacted]d 1. Supervision of high risk  pregnancy, antepartum, unspecified trimester - 28 week labs as below; RTC in 4 weeks - Tdap today  - Reviewed previous labs and [place] - Glucose Tolerance, 2 Hours w/1 Hour - CBC - HIV antibody (with reflex) - RPR - Tdap vaccine greater than or equal to 7yo IM  2. Non-English speaking patient - Arabic interpreter utilized   3. History of stillbirth - F/u per FMF; next [place] 3/16  4. Hx of preeclampsia, prior pregnancy, currently pregnant - Normal BP today. Pr/Cr and CMP WNL on 10/20/2019  5. Vaginal Itching - Wet prep collected - No abnormalities noted on exam - If wet prep negative, could trial steroid cream   Preterm labor symptoms and general obstetric precautions including but not limited to vaginal bleeding, contractions, leaking of fluid and fetal movement were reviewed in detail with the patient. Please refer to After Visit Summary for other counseling recommendations.   Return in about 4 weeks (around 02/10/2020) for [facility]; virtual okay (whatever patient prefers).  Future Appointments  Date Time Provider Department Center  01/13/2020  8:55 AM Costella Schwarz, Hoyle Sauer, MD [facility] None  01/19/2020 10:30 AM [facility] NURSE WH-MFC MFC-US  01/19/2020 10:30 AM [facility] [place] 5 WH-MFCUS MFC-US    Joselyn Arrow, MD

## 2020-01-13 NOTE — Progress Notes (Signed)
ROB/GTT.  TDAP vaccine given in LD, tolerated well.  C/o itching and burning in vagina x 2 weeks.  Denies odor, dysuria, pelvic pain, NV.

## 2020-01-14 LAB — CERVICOVAGINAL ANCILLARY ONLY
Bacterial Vaginitis (gardnerella): POSITIVE — AB
Candida Glabrata: NEGATIVE
Candida Vaginitis: POSITIVE — AB
Chlamydia: NEGATIVE
Comment: NEGATIVE
Comment: NEGATIVE
Comment: NEGATIVE
Comment: NEGATIVE
Comment: NEGATIVE
Comment: NORMAL
Neisseria Gonorrhea: NEGATIVE
Trichomonas: NEGATIVE

## 2020-01-14 LAB — CBC
Hematocrit: 32.5 % — ABNORMAL LOW (ref 34.0–46.6)
Hemoglobin: 10.7 g/dL — ABNORMAL LOW (ref 11.1–15.9)
MCH: 26.8 pg (ref 26.6–33.0)
MCHC: 32.9 g/dL (ref 31.5–35.7)
MCV: 82 fL (ref 79–97)
Platelets: 218 10*3/uL (ref 150–450)
RBC: 3.99 x10E6/uL (ref 3.77–5.28)
RDW: 13.8 % (ref 11.7–15.4)
WBC: 8.6 10*3/uL (ref 3.4–10.8)

## 2020-01-14 LAB — GLUCOSE TOLERANCE, 2 HOURS W/ 1HR
Glucose, 1 hour: 128 mg/dL (ref 65–179)
Glucose, 2 hour: 88 mg/dL (ref 65–152)
Glucose, Fasting: 81 mg/dL (ref 65–91)

## 2020-01-14 LAB — RPR: RPR Ser Ql: NONREACTIVE

## 2020-01-14 LAB — HIV ANTIBODY (ROUTINE TESTING W REFLEX): HIV Screen 4th Generation wRfx: NONREACTIVE

## 2020-01-14 MED ORDER — TERCONAZOLE 0.4 % VA CREA: 1.0000 | TOPICAL_CREAM | Freq: Every day | VAGINAL | 0 refills | Status: AC

## 2020-01-14 NOTE — Addendum Note (Signed)
Addended by: [name] on: 01/14/2020 05:41 PM   Modules accepted: Orders

## 2020-01-19 ENCOUNTER — Encounter (HOSPITAL_COMMUNITY): Payer: Self-pay

## 2020-01-19 ENCOUNTER — Other Ambulatory Visit (HOSPITAL_COMMUNITY): Payer: Self-pay | Admitting: *Deleted

## 2020-01-19 ENCOUNTER — Other Ambulatory Visit: Payer: Self-pay

## 2020-01-19 ENCOUNTER — Ambulatory Visit (HOSPITAL_COMMUNITY): Payer: Medicaid Other | Admitting: *Deleted

## 2020-01-19 ENCOUNTER — Ambulatory Visit (HOSPITAL_COMMUNITY)
Admission: RE | Admit: 2020-01-19 | Discharge: 2020-01-19 | Disposition: A | Discharge status: Home / Self Care | Payer: Medicaid Other | Source: Ambulatory Visit | Attending: Obstetrics and Gynecology | Admitting: Obstetrics and Gynecology

## 2020-01-19 DIAGNOSIS — Z8759 Personal history of other complications of pregnancy, childbirth and the puerperium: Secondary | ICD-10-CM | POA: Diagnosis present

## 2020-01-19 DIAGNOSIS — O09219 Supervision of pregnancy with history of pre-term labor, unspecified trimester: Secondary | ICD-10-CM | POA: Diagnosis not present

## 2020-01-19 DIAGNOSIS — Z3A28 28 weeks gestation of pregnancy: Secondary | ICD-10-CM | POA: Diagnosis not present

## 2020-01-19 DIAGNOSIS — O09299 Supervision of pregnancy with other poor reproductive or obstetric history, unspecified trimester: Secondary | ICD-10-CM

## 2020-01-19 DIAGNOSIS — O99212 Obesity complicating pregnancy, second trimester: Secondary | ICD-10-CM | POA: Diagnosis not present

## 2020-01-19 DIAGNOSIS — O0990 Supervision of high risk pregnancy, unspecified, unspecified trimester: Secondary | ICD-10-CM

## 2020-01-19 DIAGNOSIS — Z362 Encounter for other antenatal screening follow-up: Secondary | ICD-10-CM

## 2020-01-20 ENCOUNTER — Other Ambulatory Visit (HOSPITAL_COMMUNITY): Payer: Self-pay | Admitting: Obstetrics

## 2020-01-20 DIAGNOSIS — Z8759 Personal history of other complications of pregnancy, childbirth and the puerperium: Secondary | ICD-10-CM

## 2020-02-10 ENCOUNTER — Telehealth (INDEPENDENT_AMBULATORY_CARE_PROVIDER_SITE_OTHER): Payer: Medicaid Other | Admitting: Obstetrics & Gynecology

## 2020-02-10 VITALS — BP 95/70 | HR 83

## 2020-02-10 DIAGNOSIS — Z3A31 31 weeks gestation of pregnancy: Secondary | ICD-10-CM | POA: Diagnosis not present

## 2020-02-10 DIAGNOSIS — O09299 Supervision of pregnancy with other poor reproductive or obstetric history, unspecified trimester: Secondary | ICD-10-CM | POA: Diagnosis not present

## 2020-02-10 DIAGNOSIS — O0990 Supervision of high risk pregnancy, unspecified, unspecified trimester: Secondary | ICD-10-CM

## 2020-02-10 DIAGNOSIS — Z8759 Personal history of other complications of pregnancy, childbirth and the puerperium: Secondary | ICD-10-CM

## 2020-02-10 NOTE — Progress Notes (Signed)
   TELEHEALTH VIRTUAL OBSTETRICS VISIT ENCOUNTER NOTE  I connected with Mellody Memos on 02/09/30 at  8:30 AM EDT by telephone at home and verified that I am speaking with the correct person using two identifiers.   I discussed the limitations, risks, security and privacy concerns of performing an evaluation and management service by telephone and the availability of in person appointments. I also discussed with the patient that there may be a patient responsible charge related to this service. The patient expressed understanding and agreed to proceed.  Subjective:  Jacqueline Irwin is a 29 y.o. [phone] at [redacted]w[redacted]d being followed for ongoing prenatal care.  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  Patient reports no complaints. Reports fetal movement. Denies any contractions, bleeding or leaking of fluid.   The following portions of the patient's history were reviewed and updated as appropriate: allergies, current medications, past family history, past medical history, past social history, past surgical history and problem list.   Objective:   General:  Alert, oriented and cooperative.   Mental Status: Normal mood and affect perceived. Normal judgment and thought content.  Rest of physical exam deferred due to type of encounter  Assessment and Plan:  Pregnancy: A4Z6606 at [redacted]w[redacted]d 1. Supervision of high risk pregnancy, antepartum, unspecified trimester BP normal, doing well  2. History of stillbirth Nl growth, start fetal testing next week  3. Hx of preeclampsia, prior pregnancy, currently pregnant No sign of preeclampsia  Preterm labor symptoms and  general obstetric precautions including but not limited to vaginal bleeding, contractions, leaking of fluid and fetal movement were reviewed in detail with the patient.  I discussed the assessment and treatment plan with the patient. The patient was provided an opportunity to ask questions and all were answered. The patient agreed with the plan and demonstrated an understanding of the instructions. The patient was advised to call back or seek an in-person office evaluation/go to MAU at [facility] for any urgent or concerning symptoms. Please refer to After Visit Summary for other counseling recommendations.   I provided 15 minutes of non-face-to-face time during this encounter. Phone interpreter assisted  Return in about 3 weeks (around 03/02/2020).  Future Appointments  Date Time Provider Department Center  02/15/2020  9:45 AM [facility] NURSE WH-MFC MFC-US  02/15/2020  9:45 AM WH-MFC [place] 2 WH-MFCUS MFC-US  02/22/2020  9:45 AM WH-MFC NURSE WH-MFC MFC-US  02/22/2020  9:45 AM WH-MFC [place] 2 WH-MFCUS MFC-US  02/29/2020  9:45 AM [facility]-MFC NURSE WH-MFC MFC-US  02/29/2020  9:45 AM WH-MFC [place] 2 WH-MFCUS MFC-US    Scheryl Darter, MD Center for [facility], [facility]

## 2020-02-10 NOTE — Progress Notes (Signed)
Arabic Interpreter [phone]  I connected with  Mellody Memos on 02/10/20 by a video enabled telemedicine application and verified that I am speaking with the correct person using two identifiers.   I discussed the limitations of evaluation and management by telemedicine. The patient expressed understanding and agreed to proceed.  MyChart OB. Reports no problems today.

## 2020-02-10 NOTE — Patient Instructions (Signed)

## 2020-02-15 ENCOUNTER — Other Ambulatory Visit: Payer: Self-pay

## 2020-02-15 ENCOUNTER — Ambulatory Visit (HOSPITAL_COMMUNITY)
Admission: RE | Admit: 2020-02-15 | Discharge: 2020-02-15 | Disposition: A | Discharge status: Home / Self Care | Payer: Medicaid Other | Source: Ambulatory Visit | Attending: Obstetrics and Gynecology | Admitting: Obstetrics and Gynecology

## 2020-02-15 ENCOUNTER — Encounter (HOSPITAL_COMMUNITY): Payer: Self-pay

## 2020-02-15 ENCOUNTER — Other Ambulatory Visit (HOSPITAL_COMMUNITY): Payer: Self-pay | Admitting: *Deleted

## 2020-02-15 ENCOUNTER — Ambulatory Visit (HOSPITAL_COMMUNITY): Payer: Medicaid Other | Admitting: *Deleted

## 2020-02-15 DIAGNOSIS — Z3A32 32 weeks gestation of pregnancy: Secondary | ICD-10-CM

## 2020-02-15 DIAGNOSIS — Z362 Encounter for other antenatal screening follow-up: Secondary | ICD-10-CM | POA: Diagnosis not present

## 2020-02-15 DIAGNOSIS — Z8759 Personal history of other complications of pregnancy, childbirth and the puerperium: Secondary | ICD-10-CM | POA: Insufficient documentation

## 2020-02-15 DIAGNOSIS — O0990 Supervision of high risk pregnancy, unspecified, unspecified trimester: Secondary | ICD-10-CM | POA: Diagnosis present

## 2020-02-15 DIAGNOSIS — O09213 Supervision of pregnancy with history of pre-term labor, third trimester: Secondary | ICD-10-CM | POA: Diagnosis not present

## 2020-02-15 DIAGNOSIS — O09293 Supervision of pregnancy with other poor reproductive or obstetric history, third trimester: Secondary | ICD-10-CM

## 2020-02-22 ENCOUNTER — Ambulatory Visit (HOSPITAL_COMMUNITY): Payer: Medicaid Other | Admitting: *Deleted

## 2020-02-22 ENCOUNTER — Other Ambulatory Visit: Payer: Self-pay

## 2020-02-22 ENCOUNTER — Encounter (HOSPITAL_COMMUNITY): Payer: Self-pay

## 2020-02-22 ENCOUNTER — Ambulatory Visit (HOSPITAL_COMMUNITY)
Admission: RE | Admit: 2020-02-22 | Discharge: 2020-02-22 | Disposition: A | Discharge status: Home / Self Care | Payer: Medicaid Other | Source: Ambulatory Visit | Attending: Obstetrics and Gynecology | Admitting: Obstetrics and Gynecology

## 2020-02-22 DIAGNOSIS — O0990 Supervision of high risk pregnancy, unspecified, unspecified trimester: Secondary | ICD-10-CM | POA: Insufficient documentation

## 2020-02-22 DIAGNOSIS — Z362 Encounter for other antenatal screening follow-up: Secondary | ICD-10-CM

## 2020-02-22 DIAGNOSIS — Z8759 Personal history of other complications of pregnancy, childbirth and the puerperium: Secondary | ICD-10-CM | POA: Diagnosis present

## 2020-02-22 DIAGNOSIS — O99213 Obesity complicating pregnancy, third trimester: Secondary | ICD-10-CM

## 2020-02-22 DIAGNOSIS — O09293 Supervision of pregnancy with other poor reproductive or obstetric history, third trimester: Secondary | ICD-10-CM

## 2020-02-22 DIAGNOSIS — Z3A33 33 weeks gestation of pregnancy: Secondary | ICD-10-CM

## 2020-02-22 DIAGNOSIS — O09213 Supervision of pregnancy with history of pre-term labor, third trimester: Secondary | ICD-10-CM

## 2020-02-22 DIAGNOSIS — E669 Obesity, unspecified: Secondary | ICD-10-CM

## 2020-02-29 ENCOUNTER — Other Ambulatory Visit: Payer: Self-pay

## 2020-02-29 ENCOUNTER — Ambulatory Visit (HOSPITAL_COMMUNITY): Payer: Medicaid Other | Admitting: *Deleted

## 2020-02-29 ENCOUNTER — Encounter (HOSPITAL_COMMUNITY): Payer: Self-pay

## 2020-02-29 ENCOUNTER — Ambulatory Visit (HOSPITAL_COMMUNITY)
Admission: RE | Admit: 2020-02-29 | Discharge: 2020-02-29 | Disposition: A | Discharge status: Home / Self Care | Payer: Medicaid Other | Source: Ambulatory Visit | Attending: Obstetrics and Gynecology | Admitting: Obstetrics and Gynecology

## 2020-02-29 DIAGNOSIS — O0990 Supervision of high risk pregnancy, unspecified, unspecified trimester: Secondary | ICD-10-CM

## 2020-02-29 DIAGNOSIS — O09213 Supervision of pregnancy with history of pre-term labor, third trimester: Secondary | ICD-10-CM | POA: Diagnosis not present

## 2020-02-29 DIAGNOSIS — Z3A34 34 weeks gestation of pregnancy: Secondary | ICD-10-CM

## 2020-02-29 DIAGNOSIS — O99213 Obesity complicating pregnancy, third trimester: Secondary | ICD-10-CM | POA: Diagnosis not present

## 2020-02-29 DIAGNOSIS — Z8759 Personal history of other complications of pregnancy, childbirth and the puerperium: Secondary | ICD-10-CM | POA: Diagnosis present

## 2020-02-29 DIAGNOSIS — O09293 Supervision of pregnancy with other poor reproductive or obstetric history, third trimester: Secondary | ICD-10-CM

## 2020-02-29 DIAGNOSIS — E669 Obesity, unspecified: Secondary | ICD-10-CM

## 2020-02-29 DIAGNOSIS — Z362 Encounter for other antenatal screening follow-up: Secondary | ICD-10-CM

## 2020-03-02 ENCOUNTER — Telehealth (INDEPENDENT_AMBULATORY_CARE_PROVIDER_SITE_OTHER): Payer: Medicaid Other | Admitting: Obstetrics and Gynecology

## 2020-03-02 ENCOUNTER — Encounter: Payer: Self-pay | Admitting: Obstetrics and Gynecology

## 2020-03-02 VITALS — BP 117/84 | HR 88

## 2020-03-02 DIAGNOSIS — O09899 Supervision of other high risk pregnancies, unspecified trimester: Secondary | ICD-10-CM | POA: Diagnosis not present

## 2020-03-02 DIAGNOSIS — Z8759 Personal history of other complications of pregnancy, childbirth and the puerperium: Secondary | ICD-10-CM

## 2020-03-02 DIAGNOSIS — E559 Vitamin D deficiency, unspecified: Secondary | ICD-10-CM

## 2020-03-02 DIAGNOSIS — O09299 Supervision of pregnancy with other poor reproductive or obstetric history, unspecified trimester: Secondary | ICD-10-CM | POA: Diagnosis not present

## 2020-03-02 DIAGNOSIS — Z2839 Other underimmunization status: Secondary | ICD-10-CM

## 2020-03-02 DIAGNOSIS — Z789 Other specified health status: Secondary | ICD-10-CM

## 2020-03-02 DIAGNOSIS — Z283 Underimmunization status: Secondary | ICD-10-CM

## 2020-03-02 DIAGNOSIS — O0990 Supervision of high risk pregnancy, unspecified, unspecified trimester: Secondary | ICD-10-CM

## 2020-03-02 MED ORDER — OMEPRAZOLE 20 MG PO CPDR: 20.0000 mg | DELAYED_RELEASE_CAPSULE | Freq: Two times a day (BID) | ORAL | 3 refills | Status: DC

## 2020-03-02 MED ORDER — VITAMIN D (ERGOCALCIFEROL) 1.25 MG (50000 UNIT) PO CAPS: 50000.0000 [IU] | ORAL_CAPSULE | ORAL | 2 refills | Status: DC

## 2020-03-02 NOTE — Addendum Note (Signed)
Addended by: Hermina Staggers on: 03/02/2020 09:52 AM   Modules accepted: Orders

## 2020-03-02 NOTE — Progress Notes (Addendum)
   TELEHEALTH VIRTUAL OBSTETRICS VISIT ENCOUNTER NOTE  I connected with Jacqueline Irwin on 03/02/20 at  9:15 AM EDT by telephone at home and verified that I am speaking with the correct person using two identifiers. Location [facility] office   I discussed the limitations, risks, security and privacy concerns of performing an evaluation and management service by telephone and the availability of in person appointments. I also discussed with the patient that there may be a patient responsible charge related to this service. The patient expressed understanding and agreed to proceed.  Subjective:  Jacqueline Irwin is a 29 y.o. [phone] at [redacted]w[redacted]d being followed for ongoing prenatal care.  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  Patient reports no complaints. Reports fetal movement. Denies any contractions, bleeding or leaking of fluid.   The following portions of the patient's history were reviewed and updated as appropriate: allergies, current medications, past family history, past medical history, past social history, past surgical history and problem list.   Objective:   General:  Alert, oriented and cooperative.   Mental Status: Normal mood and affect perceived. Normal judgment and thought content.  Rest of physical exam deferred due to type of encounter  Assessment and Plan:  Pregnancy: [id number] at [redacted]w[redacted]d 1. Supervision of high risk pregnancy, antepartum, unspecified trimester Stable GBS at next visit  2. History of stillbirth Continue with antenatal testing  3. Hx of preeclampsia, prior pregnancy, currently pregnant Pt reports normal BP readings at  home Continue with qd BASA  4. Non-English speaking patient Interrupter used during today's visit  5. Maternal varicella, non-immune Vaccine PP  Preterm labor symptoms and general obstetric precautions including but not limited to vaginal bleeding, contractions, leaking of fluid and fetal movement were reviewed in detail with the patient.  I discussed the assessment and treatment plan with the patient. The patient was provided an opportunity to ask questions and all were answered. The patient agreed with the plan and demonstrated an understanding of the instructions. The patient was advised to call back or seek an in-person office evaluation/go to [facility] at [facility] for any urgent or concerning symptoms. Please refer to After Visit Summary for other counseling recommendations.   I provided 8 minutes of non-face-to-face time during this encounter.  Return in about 2 weeks (around 03/16/2020) for OB visit, face to face for GBS, MD provider.  Future Appointments  Date Time Provider Department Center  03/07/2020 10:00 AM WH-MFC NURSE WH-MFC MFC-US  03/07/2020 10:00 AM WH-MFC [place] 3 WH-MFCUS MFC-US  03/14/2020  7:45 AM WH-MFC NURSE WH-MFC MFC-US  03/14/2020  7:45 AM WH-MFC [place] 2 WH-MFCUS MFC-US  03/21/2020  8:00 AM WH-MFC NURSE WH-MFC MFC-US  03/21/2020  8:00 AM WH-MFC [place] 1 WH-MFCUS MFC-US  03/28/2020  8:00 AM WH-MFC [place] 1 WH-MFCUS MFC-US  04/05/2020  8:00 AM WH-MFC [place] 1 WH-MFCUS MFC-US    Hermina Staggers, MD Center for [employer], [facility]

## 2020-03-02 NOTE — Progress Notes (Signed)
Arabic Interpreter [phone]  I connected with  Jacqueline Irwin on 03/02/20 by a video enabled telemedicine application and verified that I am speaking with the correct person using two identifiers.   I discussed the limitations of evaluation and management by telemedicine. The patient expressed understanding and agreed to proceed.   MyChart OB.  Reports no problems today.

## 2020-03-07 ENCOUNTER — Other Ambulatory Visit: Payer: Self-pay

## 2020-03-07 ENCOUNTER — Ambulatory Visit: Payer: Medicaid Other | Admitting: *Deleted

## 2020-03-07 ENCOUNTER — Ambulatory Visit (HOSPITAL_COMMUNITY): Payer: Medicaid Other | Attending: Obstetrics and Gynecology

## 2020-03-07 DIAGNOSIS — O09293 Supervision of pregnancy with other poor reproductive or obstetric history, third trimester: Secondary | ICD-10-CM | POA: Diagnosis not present

## 2020-03-07 DIAGNOSIS — Z8759 Personal history of other complications of pregnancy, childbirth and the puerperium: Secondary | ICD-10-CM | POA: Insufficient documentation

## 2020-03-07 DIAGNOSIS — O09213 Supervision of pregnancy with history of pre-term labor, third trimester: Secondary | ICD-10-CM

## 2020-03-07 DIAGNOSIS — O99213 Obesity complicating pregnancy, third trimester: Secondary | ICD-10-CM | POA: Diagnosis not present

## 2020-03-07 DIAGNOSIS — Z3A35 35 weeks gestation of pregnancy: Secondary | ICD-10-CM

## 2020-03-07 DIAGNOSIS — E669 Obesity, unspecified: Secondary | ICD-10-CM

## 2020-03-07 DIAGNOSIS — O0990 Supervision of high risk pregnancy, unspecified, unspecified trimester: Secondary | ICD-10-CM

## 2020-03-07 DIAGNOSIS — Z362 Encounter for other antenatal screening follow-up: Secondary | ICD-10-CM

## 2020-03-14 ENCOUNTER — Ambulatory Visit (HOSPITAL_COMMUNITY): Payer: Medicaid Other | Attending: Obstetrics and Gynecology

## 2020-03-14 ENCOUNTER — Other Ambulatory Visit: Payer: Self-pay

## 2020-03-14 ENCOUNTER — Ambulatory Visit: Payer: Medicaid Other | Admitting: *Deleted

## 2020-03-14 DIAGNOSIS — Z8759 Personal history of other complications of pregnancy, childbirth and the puerperium: Secondary | ICD-10-CM | POA: Diagnosis present

## 2020-03-14 DIAGNOSIS — O0990 Supervision of high risk pregnancy, unspecified, unspecified trimester: Secondary | ICD-10-CM | POA: Diagnosis present

## 2020-03-14 DIAGNOSIS — O09293 Supervision of pregnancy with other poor reproductive or obstetric history, third trimester: Secondary | ICD-10-CM | POA: Diagnosis not present

## 2020-03-14 DIAGNOSIS — Z3A36 36 weeks gestation of pregnancy: Secondary | ICD-10-CM

## 2020-03-16 ENCOUNTER — Encounter: Payer: Self-pay | Admitting: Obstetrics and Gynecology

## 2020-03-16 ENCOUNTER — Ambulatory Visit (INDEPENDENT_AMBULATORY_CARE_PROVIDER_SITE_OTHER): Payer: Medicaid Other | Admitting: Obstetrics and Gynecology

## 2020-03-16 ENCOUNTER — Other Ambulatory Visit (HOSPITAL_COMMUNITY)
Admission: RE | Admit: 2020-03-16 | Discharge: 2020-03-16 | Disposition: A | Discharge status: Home / Self Care | Payer: Medicaid Other | Source: Ambulatory Visit | Attending: Obstetrics and Gynecology | Admitting: Obstetrics and Gynecology

## 2020-03-16 ENCOUNTER — Other Ambulatory Visit: Payer: Self-pay

## 2020-03-16 VITALS — BP 113/75 | HR 90 | Wt 188.0 lb

## 2020-03-16 DIAGNOSIS — O0990 Supervision of high risk pregnancy, unspecified, unspecified trimester: Secondary | ICD-10-CM | POA: Diagnosis present

## 2020-03-16 DIAGNOSIS — Z8759 Personal history of other complications of pregnancy, childbirth and the puerperium: Secondary | ICD-10-CM

## 2020-03-16 DIAGNOSIS — Z789 Other specified health status: Secondary | ICD-10-CM

## 2020-03-16 DIAGNOSIS — O09299 Supervision of pregnancy with other poor reproductive or obstetric history, unspecified trimester: Secondary | ICD-10-CM

## 2020-03-16 NOTE — Progress Notes (Signed)
ROB/GBS.  C/o itching, burning x 5 days.  Denies discharge, odor.

## 2020-03-16 NOTE — Progress Notes (Signed)
Induction Assessment Scheduling Form: Fax to [facility]:  [phone] Route to [facility]-2S Labor Delivery   Jacqueline Irwin                                                                                   DOB:  [birth year]                                                            MRN:  [id number]  Phone:  Home Phone [phone]  Mobile [phone]    Provider:  [facility] (Faculty [facility])  Admission Date/Time:  03/28/20 GP:  [id number]     Gestational age on admission:  38                                                Estimated Date of Delivery: 04/11/20  Dating Criteria: LMP  Filed Weights   03/16/20 0938  Weight: 188 lb (85.3 kg)    GBS:   HIV:  Non Reactive (03/10 0853)  Medical Indications for induction:  History of IUFd, recommended for delivery 37-38 weeks per MFM Scheduling Provider Signature:  Conan Bowens, MD         Method of induction(proposed):  Cytotec   Scheduling Provider Signature:  Conan Bowens, MD                                            Today's Date:  03/16/2020

## 2020-03-16 NOTE — Progress Notes (Signed)
   PRENATAL VISIT NOTE  Subjective:  Jacqueline Irwin is a 29 y.o. [phone] at [redacted]w[redacted]d being seen today for ongoing prenatal care.  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  Patient reports no complaints.  Contractions: Not present. Vag. Bleeding: None.  Movement: Present. Denies leaking of fluid.   The following portions of the patient's history were reviewed and updated as appropriate: allergies, current medications, past family history, past medical history, past social history, past surgical history and problem list.   Objective:   Vitals:   03/16/20 0938  BP: 113/75  Pulse: 90  Weight: 188 lb (85.3 kg)    Fetal Status: Fetal Heart Rate (bpm): 137   Movement: Present     General:  Alert, oriented and cooperative. Patient is in no acute distress.  Skin: Skin is warm and dry. No rash noted.   Cardiovascular: Normal heart rate noted  Respiratory: Normal respiratory effort, no problems with respiration noted  Abdomen: Soft, gravid, appropriate for gestational age.  Pain/Pressure: Present     Pelvic: Cervical exam deferred        Extremities: Normal range of motion.  Edema: None  Mental Status: Normal mood and affect. Normal behavior. Normal judgment and thought content.   Assessment and Plan:  Pregnancy: [id number] at [redacted]w[redacted]d  1. Hx of preeclampsia, prior pregnancy, currently pregnant  2. History of stillbirth Normal weekly testing Per MFM note 02/29/20, recommend delivery 37-38 weeks Will request IOL for 38 weeks today  3. Supervision of high risk pregnancy, antepartum, unspecified trimester  4. Non-English speaking patient Arabic interpretor  used Pt prefers virtual interpretor during labor   Preterm labor symptoms and general obstetric precautions including but not limited to vaginal bleeding, contractions, leaking of fluid and fetal movement were reviewed in detail with the patient. Please refer to After Visit Summary for other counseling recommendations.   Return in about 1 week (around 03/23/2020) for high OB, in person.  Future Appointments  Date Time Provider Department Center  03/21/2020  8:00 AM [facility] NURSE [facility]  03/21/2020  8:00 AM [facility] US1 [facility]  03/23/2020  2:45 PM Conan Bowens, MD [facility] None  03/28/2020  8:00 AM [facility] US1 [facility]  04/05/2020  8:00 AM [facility] US1 [facility]    Conan Bowens, MD

## 2020-03-17 LAB — CERVICOVAGINAL ANCILLARY ONLY
Bacterial Vaginitis (gardnerella): POSITIVE — AB
Candida Glabrata: NEGATIVE
Candida Vaginitis: POSITIVE — AB
Chlamydia: NEGATIVE
Comment: NEGATIVE
Comment: NEGATIVE
Comment: NEGATIVE
Comment: NEGATIVE
Comment: NEGATIVE
Comment: NORMAL
Neisseria Gonorrhea: NEGATIVE
Trichomonas: NEGATIVE

## 2020-03-17 MED ORDER — METRONIDAZOLE 500 MG PO TABS: 500.0000 mg | ORAL_TABLET | Freq: Two times a day (BID) | ORAL | 0 refills | Status: DC

## 2020-03-17 MED ORDER — TERCONAZOLE 0.8 % VA CREA
1.0000 | TOPICAL_CREAM | Freq: Every day | VAGINAL | 0 refills | Status: DC
Start: 2020-03-17 — End: 2020-03-30

## 2020-03-17 NOTE — Addendum Note (Signed)
Addended by: Leroy Libman on: 03/17/2020 11:06 AM   Modules accepted: Orders

## 2020-03-18 LAB — STREP GP B NAA: Strep Gp B NAA: POSITIVE — AB

## 2020-03-21 ENCOUNTER — Other Ambulatory Visit: Payer: Self-pay

## 2020-03-21 ENCOUNTER — Ambulatory Visit: Payer: Medicaid Other | Admitting: *Deleted

## 2020-03-21 ENCOUNTER — Other Ambulatory Visit: Payer: Self-pay | Admitting: Family Medicine

## 2020-03-21 ENCOUNTER — Ambulatory Visit (HOSPITAL_COMMUNITY): Payer: Medicaid Other | Attending: Obstetrics and Gynecology

## 2020-03-21 DIAGNOSIS — O09293 Supervision of pregnancy with other poor reproductive or obstetric history, third trimester: Secondary | ICD-10-CM

## 2020-03-21 DIAGNOSIS — Z8759 Personal history of other complications of pregnancy, childbirth and the puerperium: Secondary | ICD-10-CM

## 2020-03-21 DIAGNOSIS — O0990 Supervision of high risk pregnancy, unspecified, unspecified trimester: Secondary | ICD-10-CM

## 2020-03-21 DIAGNOSIS — Z3A37 37 weeks gestation of pregnancy: Secondary | ICD-10-CM

## 2020-03-22 ENCOUNTER — Telehealth (HOSPITAL_COMMUNITY): Payer: Self-pay | Admitting: *Deleted

## 2020-03-22 ENCOUNTER — Encounter (HOSPITAL_COMMUNITY): Payer: Self-pay | Admitting: *Deleted

## 2020-03-22 NOTE — Telephone Encounter (Signed)
Preadmission screen Interpreter number [phone]

## 2020-03-23 ENCOUNTER — Encounter: Payer: Self-pay | Admitting: Obstetrics and Gynecology

## 2020-03-23 ENCOUNTER — Ambulatory Visit (INDEPENDENT_AMBULATORY_CARE_PROVIDER_SITE_OTHER): Payer: Medicaid Other | Admitting: Obstetrics and Gynecology

## 2020-03-23 ENCOUNTER — Other Ambulatory Visit: Payer: Self-pay

## 2020-03-23 VITALS — BP 127/83 | HR 98 | Wt 191.1 lb

## 2020-03-23 DIAGNOSIS — Z8759 Personal history of other complications of pregnancy, childbirth and the puerperium: Secondary | ICD-10-CM

## 2020-03-23 DIAGNOSIS — O0990 Supervision of high risk pregnancy, unspecified, unspecified trimester: Secondary | ICD-10-CM

## 2020-03-23 DIAGNOSIS — O09299 Supervision of pregnancy with other poor reproductive or obstetric history, unspecified trimester: Secondary | ICD-10-CM

## 2020-03-23 DIAGNOSIS — Z789 Other specified health status: Secondary | ICD-10-CM

## 2020-03-23 NOTE — Progress Notes (Signed)
Pt is here for ROB, [redacted]w[redacted]d.  

## 2020-03-23 NOTE — Progress Notes (Signed)
   PRENATAL VISIT NOTE  Subjective:  Jacqueline Irwin is a 29 y.o. [phone] at [redacted]w[redacted]d being seen today for ongoing prenatal care.  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  Patient reports no complaints.  Contractions: Irregular. Vag. Bleeding: None.  Movement: Present. Denies leaking of fluid.   The following portions of the patient's history were reviewed and updated as appropriate: allergies, current medications, past family history, past medical history, past social history, past surgical history and problem list.   Objective:   Vitals:   03/23/20 1519  BP: 127/83  Pulse: 98  Weight: 191 lb 1.6 oz (86.7 kg)    Fetal Status: Fetal Heart Rate (bpm): 140   Movement: Present     General:  Alert, oriented and cooperative. Patient is in no acute distress.  Skin: Skin is warm and dry. No rash noted.   Cardiovascular: Normal heart rate noted  Respiratory: Normal respiratory effort, no problems with respiration noted  Abdomen: Soft, gravid, appropriate for gestational age.  Pain/Pressure: Absent     Pelvic: Cervical exam deferred        Extremities: Normal range of motion.  Edema: None  Mental Status: Normal mood and affect. Normal behavior. Normal judgment and thought content.   Assessment and Plan:  Pregnancy: [id number] at [redacted]w[redacted]d  1. Supervision of high risk pregnancy, antepartum, unspecified trimester - Would like female providers, reviewed that there are female and female providers in hospital, she may have female providers depending on who is on call while she is there  2. Hx of preeclampsia, prior pregnancy, currently pregnant BP stable  3.  Non-English speaking patient Print production planner used  4. History of stillbirth IOL previously scheduled  Term labor symptoms and general obstetric precautions including but not limited to vaginal bleeding, contractions, leaking of fluid and fetal movement were reviewed in detail with the patient. Please refer to After Visit Summary for other counseling recommendations.   Return in about 5 weeks (around 04/27/2020) for post partum check.  Future Appointments  Date Time Provider Department Center  03/26/2020  9:40 AM MC-SCREENING MC-SDSC None  03/28/2020  7:10 AM MC-LD SCHED ROOM MC-INDC None    Conan Bowens, MD

## 2020-03-26 ENCOUNTER — Other Ambulatory Visit (HOSPITAL_COMMUNITY)
Admission: RE | Admit: 2020-03-26 | Discharge: 2020-03-26 | Disposition: A | Discharge status: Home / Self Care | Payer: Medicaid Other | Source: Ambulatory Visit | Attending: Family Medicine | Admitting: Family Medicine

## 2020-03-26 DIAGNOSIS — Z20822 Contact with and (suspected) exposure to covid-19: Secondary | ICD-10-CM | POA: Insufficient documentation

## 2020-03-26 DIAGNOSIS — Z01812 Encounter for preprocedural laboratory examination: Secondary | ICD-10-CM | POA: Insufficient documentation

## 2020-03-26 LAB — SARS CORONAVIRUS 2 (TAT 6-24 HRS): SARS Coronavirus 2: NEGATIVE

## 2020-03-28 ENCOUNTER — Inpatient Hospital Stay (HOSPITAL_COMMUNITY): Payer: Medicaid Other

## 2020-03-28 ENCOUNTER — Encounter (HOSPITAL_COMMUNITY): Payer: Self-pay | Admitting: Obstetrics and Gynecology

## 2020-03-28 ENCOUNTER — Inpatient Hospital Stay (HOSPITAL_COMMUNITY): Payer: Medicaid Other | Admitting: Anesthesiology

## 2020-03-28 ENCOUNTER — Inpatient Hospital Stay (HOSPITAL_COMMUNITY)
Admission: AD | Admit: 2020-03-28 | Discharge: 2020-03-30 | DRG: 807 | Disposition: A | Discharge status: Home / Self Care | Payer: Medicaid Other | Attending: Obstetrics & Gynecology | Admitting: Obstetrics & Gynecology

## 2020-03-28 ENCOUNTER — Ambulatory Visit: Payer: Medicaid Other

## 2020-03-28 DIAGNOSIS — O0990 Supervision of high risk pregnancy, unspecified, unspecified trimester: Secondary | ICD-10-CM

## 2020-03-28 DIAGNOSIS — Z2839 Other underimmunization status: Secondary | ICD-10-CM

## 2020-03-28 DIAGNOSIS — O99824 Streptococcus B carrier state complicating childbirth: Secondary | ICD-10-CM | POA: Diagnosis present

## 2020-03-28 DIAGNOSIS — Z8759 Personal history of other complications of pregnancy, childbirth and the puerperium: Secondary | ICD-10-CM

## 2020-03-28 DIAGNOSIS — Z789 Other specified health status: Secondary | ICD-10-CM | POA: Diagnosis present

## 2020-03-28 DIAGNOSIS — Z3A38 38 weeks gestation of pregnancy: Secondary | ICD-10-CM

## 2020-03-28 DIAGNOSIS — O99214 Obesity complicating childbirth: Secondary | ICD-10-CM | POA: Diagnosis present

## 2020-03-28 DIAGNOSIS — O09299 Supervision of pregnancy with other poor reproductive or obstetric history, unspecified trimester: Secondary | ICD-10-CM

## 2020-03-28 DIAGNOSIS — O09899 Supervision of other high risk pregnancies, unspecified trimester: Secondary | ICD-10-CM

## 2020-03-28 DIAGNOSIS — O26893 Other specified pregnancy related conditions, third trimester: Secondary | ICD-10-CM | POA: Diagnosis present

## 2020-03-28 DIAGNOSIS — E669 Obesity, unspecified: Secondary | ICD-10-CM | POA: Diagnosis present

## 2020-03-28 HISTORY — DX: Personal history of other complications of pregnancy, childbirth and the puerperium: Z87.59

## 2020-03-28 HISTORY — DX: Gestational (pregnancy-induced) hypertension without significant proteinuria, unspecified trimester: O13.9

## 2020-03-28 LAB — CBC
HCT: 32.9 % — ABNORMAL LOW (ref 36.0–46.0)
Hemoglobin: 10 g/dL — ABNORMAL LOW (ref 12.0–15.0)
MCH: 24.5 pg — ABNORMAL LOW (ref 26.0–34.0)
MCHC: 30.4 g/dL (ref 30.0–36.0)
MCV: 80.6 fL (ref 80.0–100.0)
Platelets: 234 10*3/uL (ref 150–400)
RBC: 4.08 MIL/uL (ref 3.87–5.11)
RDW: 14.6 % (ref 11.5–15.5)
WBC: 7.5 10*3/uL (ref 4.0–10.5)
nRBC: 0 % (ref 0.0–0.2)

## 2020-03-28 LAB — ABO/RH: ABO/RH(D): B POS

## 2020-03-28 LAB — TYPE AND SCREEN
ABO/RH(D): B POS
Antibody Screen: NEGATIVE

## 2020-03-28 MED ORDER — SOD CITRATE-CITRIC ACID 500-334 MG/5ML PO SOLN: 30.0000 mL | ORAL | Status: DC | PRN

## 2020-03-28 MED ORDER — FENTANYL CITRATE (PF) 100 MCG/2ML IJ SOLN
100.0000 ug | INTRAMUSCULAR | Status: DC | PRN
  Administered 2020-03-28: 100 ug via INTRAVENOUS

## 2020-03-28 MED ORDER — COCONUT OIL OIL: 1.0000 "application " | TOPICAL_OIL | Status: DC | PRN

## 2020-03-28 MED ORDER — TETANUS-DIPHTH-ACELL PERTUSSIS 5-2.5-18.5 LF-MCG/0.5 IM SUSP: 0.5000 mL | Freq: Once | INTRAMUSCULAR | Status: DC

## 2020-03-28 MED ORDER — OXYTOCIN BOLUS FROM INFUSION
500.0000 mL | Freq: Once | INTRAVENOUS | Status: AC
  Administered 2020-03-28: 500 mL via INTRAVENOUS

## 2020-03-28 MED ORDER — ACETAMINOPHEN 325 MG PO TABS
650.0000 mg | ORAL_TABLET | Freq: Four times a day (QID) | ORAL | Status: DC | PRN
  Administered 2020-03-28 – 2020-03-30 (×5): 650 mg via ORAL
  Filled 2020-03-28 (×5): qty 2

## 2020-03-28 MED ORDER — PHENYLEPHRINE 40 MCG/ML (10ML) SYRINGE FOR IV PUSH (FOR BLOOD PRESSURE SUPPORT): 80.0000 ug | PREFILLED_SYRINGE | INTRAVENOUS | Status: DC | PRN

## 2020-03-28 MED ORDER — MISOPROSTOL 25 MCG QUARTER TABLET: 25.0000 ug | ORAL_TABLET | ORAL | Status: DC | PRN

## 2020-03-28 MED ORDER — EPHEDRINE 5 MG/ML INJ: 10.0000 mg | INTRAVENOUS | Status: DC | PRN

## 2020-03-28 MED ORDER — LACTATED RINGERS IV SOLN: INTRAVENOUS | Status: DC

## 2020-03-28 MED ORDER — TERBUTALINE SULFATE 1 MG/ML IJ SOLN: 0.2500 mg | Freq: Once | INTRAMUSCULAR | Status: DC | PRN

## 2020-03-28 MED ORDER — SODIUM CHLORIDE (PF) 0.9 % IJ SOLN
INTRAMUSCULAR | Status: DC | PRN
  Administered 2020-03-28: 12 mL/h via EPIDURAL

## 2020-03-28 MED ORDER — SIMETHICONE 80 MG PO CHEW: 80.0000 mg | CHEWABLE_TABLET | ORAL | Status: DC | PRN

## 2020-03-28 MED ORDER — LIDOCAINE HCL (PF) 1 % IJ SOLN
INTRAMUSCULAR | Status: DC | PRN
  Administered 2020-03-28: 6 mL via EPIDURAL
  Administered 2020-03-28: 7 mL via EPIDURAL

## 2020-03-28 MED ORDER — OXYCODONE-ACETAMINOPHEN 5-325 MG PO TABS: 1.0000 | ORAL_TABLET | ORAL | Status: DC | PRN

## 2020-03-28 MED ORDER — MISOPROSTOL 50MCG HALF TABLET
50.0000 ug | ORAL_TABLET | ORAL | Status: DC | PRN
  Administered 2020-03-28: 50 ug via ORAL
  Filled 2020-03-28: qty 1

## 2020-03-28 MED ORDER — WITCH HAZEL-GLYCERIN EX PADS: 1.0000 "application " | MEDICATED_PAD | CUTANEOUS | Status: DC | PRN

## 2020-03-28 MED ORDER — ONDANSETRON HCL 4 MG/2ML IJ SOLN: 4.0000 mg | INTRAMUSCULAR | Status: DC | PRN

## 2020-03-28 MED ORDER — OXYTOCIN 40 UNITS IN NORMAL SALINE INFUSION - SIMPLE MED
1.0000 m[IU]/min | INTRAVENOUS | Status: DC
  Administered 2020-03-28: 2 m[IU]/min via INTRAVENOUS
  Filled 2020-03-28: qty 1000

## 2020-03-28 MED ORDER — LACTATED RINGERS IV SOLN: 500.0000 mL | INTRAVENOUS | Status: DC | PRN

## 2020-03-28 MED ORDER — DIPHENHYDRAMINE HCL 50 MG/ML IJ SOLN: 12.5000 mg | INTRAMUSCULAR | Status: DC | PRN

## 2020-03-28 MED ORDER — ONDANSETRON HCL 4 MG/2ML IJ SOLN: 4.0000 mg | Freq: Four times a day (QID) | INTRAMUSCULAR | Status: DC | PRN

## 2020-03-28 MED ORDER — ONDANSETRON HCL 4 MG PO TABS: 4.0000 mg | ORAL_TABLET | ORAL | Status: DC | PRN

## 2020-03-28 MED ORDER — FENTANYL-BUPIVACAINE-NACL 0.5-0.125-0.9 MG/250ML-% EP SOLN
12.0000 mL/h | EPIDURAL | Status: DC | PRN
  Filled 2020-03-28: qty 250

## 2020-03-28 MED ORDER — SENNOSIDES-DOCUSATE SODIUM 8.6-50 MG PO TABS
2.0000 | ORAL_TABLET | ORAL | Status: DC
  Administered 2020-03-28 – 2020-03-29 (×2): 2 via ORAL
  Filled 2020-03-28 (×2): qty 2

## 2020-03-28 MED ORDER — DIPHENHYDRAMINE HCL 25 MG PO CAPS: 25.0000 mg | ORAL_CAPSULE | Freq: Four times a day (QID) | ORAL | Status: DC | PRN

## 2020-03-28 MED ORDER — OXYTOCIN 40 UNITS IN NORMAL SALINE INFUSION - SIMPLE MED: 2.5000 [IU]/h | INTRAVENOUS | Status: DC

## 2020-03-28 MED ORDER — MEASLES, MUMPS & RUBELLA VAC IJ SOLR: 0.5000 mL | Freq: Once | INTRAMUSCULAR | Status: DC

## 2020-03-28 MED ORDER — PHENYLEPHRINE 40 MCG/ML (10ML) SYRINGE FOR IV PUSH (FOR BLOOD PRESSURE SUPPORT)
80.0000 ug | PREFILLED_SYRINGE | INTRAVENOUS | Status: DC | PRN
  Filled 2020-03-28: qty 10

## 2020-03-28 MED ORDER — ACETAMINOPHEN 325 MG PO TABS: 650.0000 mg | ORAL_TABLET | ORAL | Status: DC | PRN

## 2020-03-28 MED ORDER — PENICILLIN G POT IN DEXTROSE 60000 UNIT/ML IV SOLN
3.0000 10*6.[IU] | INTRAVENOUS | Status: DC
  Administered 2020-03-28 (×2): 3 10*6.[IU] via INTRAVENOUS
  Filled 2020-03-28 (×2): qty 50

## 2020-03-28 MED ORDER — BENZOCAINE-MENTHOL 20-0.5 % EX AERO
1.0000 "application " | INHALATION_SPRAY | CUTANEOUS | Status: DC | PRN
  Administered 2020-03-28 – 2020-03-30 (×2): 1 via TOPICAL
  Filled 2020-03-28 (×2): qty 56

## 2020-03-28 MED ORDER — FENTANYL CITRATE (PF) 100 MCG/2ML IJ SOLN
INTRAMUSCULAR | Status: AC
  Filled 2020-03-28: qty 2

## 2020-03-28 MED ORDER — LACTATED RINGERS IV SOLN
500.0000 mL | Freq: Once | INTRAVENOUS | Status: AC
  Administered 2020-03-28: 500 mL via INTRAVENOUS

## 2020-03-28 MED ORDER — DIBUCAINE (PERIANAL) 1 % EX OINT: 1.0000 "application " | TOPICAL_OINTMENT | CUTANEOUS | Status: DC | PRN

## 2020-03-28 MED ORDER — PRENATAL MULTIVITAMIN CH
1.0000 | ORAL_TABLET | Freq: Every day | ORAL | Status: DC
  Administered 2020-03-29 – 2020-03-30 (×2): 1 via ORAL
  Filled 2020-03-28 (×2): qty 1

## 2020-03-28 MED ORDER — IBUPROFEN 600 MG PO TABS
600.0000 mg | ORAL_TABLET | Freq: Three times a day (TID) | ORAL | Status: DC | PRN
  Administered 2020-03-29 – 2020-03-30 (×3): 600 mg via ORAL
  Filled 2020-03-28 (×6): qty 1

## 2020-03-28 MED ORDER — SODIUM CHLORIDE 0.9 % IV SOLN
5.0000 10*6.[IU] | Freq: Once | INTRAVENOUS | Status: AC
  Administered 2020-03-28: 5 10*6.[IU] via INTRAVENOUS
  Filled 2020-03-28: qty 5

## 2020-03-28 MED ORDER — LIDOCAINE HCL (PF) 1 % IJ SOLN: 30.0000 mL | INTRAMUSCULAR | Status: DC | PRN

## 2020-03-28 MED ORDER — OXYCODONE-ACETAMINOPHEN 5-325 MG PO TABS: 2.0000 | ORAL_TABLET | ORAL | Status: DC | PRN

## 2020-03-28 NOTE — Discharge Summary (Addendum)
Postpartum Discharge Summary   Patient Name: Jacqueline Irwin DOB: 1991-02-13 MRN: [id number]  Date of admission: 03/28/2020 Delivery date:03/28/2020  Delivering provider: Chauncey Mann  Date of discharge: 03/30/2020  Admitting diagnosis: History of IUFD [Z87.59] Intrauterine pregnancy: [redacted]w[redacted]d    Secondary diagnosis:  Active Problems:   Non-English speaking patient   Hx of preeclampsia, prior pregnancy, currently pregnant   Maternal varicella, non-immune   History of IUFD  Additional problems: None    Discharge diagnosis: Term Pregnancy Delivered                                              Post partum procedures:None Augmentation: Pitocin, Cytotec and IP Foley Complications: None  Hospital course: Induction of Labor With Vaginal Delivery   29y.o. yo [id number] admitted to the hospital 03/28/2020 for induction of labor.  Indication for induction: hx IUFD.  Patient had an uncomplicated labor course as follows: Initial SVE: 1.5/thick/-3. Patient received Cytotec, Foley balloon and Pitocin. Received epidural. SROM occurred. She then progressed to complete.  Membrane Rupture Time/Date: 8:07 PM ,03/28/2020   Delivery Method:Vaginal, Spontaneous  Episiotomy: None  Lacerations:  1st degree  Details of delivery can be found in separate delivery note.  Patient had a routine postpartum course. Patient is discharged home 03/30/20.  Newborn Data: Birth date:03/28/2020  Birth time:8:25 PM  Gender:Female  Living status:Living  Apgars:8 ,9  Weight:2716 g   Magnesium Sulfate received: No BMZ received: No Rhophylac:No MMR:No T-DaP:Given prenatally Flu: No Transfusion:No  Physical exam  Vitals:   03/29/20 0745 03/29/20 1245 03/29/20 2135 03/30/20 0429  BP: 111/77 125/85 (!) 126/93 137/90  Pulse: 63 66 72 64  Resp: 18 16 18 16   Temp: 98.1 F (36.7 C) (!) 97.3 F (36.3 C) 98.5 F (36.9 C) 98.3 F (36.8 C)  TempSrc: Oral Axillary Oral Oral  SpO2:  100% 100% 100%   Weight:      Height:       General: alert, cooperative and no distress Lochia: appropriate Uterine Fundus: firm Incision: N/A DVT Evaluation: No evidence of DVT seen on physical exam. No cords or calf tenderness. No significant calf/ankle edema. Labs: Lab Results  Component Value Date   WBC 7.5 03/28/2020   HGB 10.0 (L) 03/28/2020   HCT 32.9 (L) 03/28/2020   MCV 80.6 03/28/2020   PLT 234 03/28/2020   CMP Latest Ref Rng & Units 10/20/2019  Glucose 65 - 99 mg/dL 92  BUN 6 - 20 mg/dL 7  Creatinine 0.57 - 1.00 mg/dL 0.74  Sodium 134 - 144 mmol/L 139  Potassium 3.5 - 5.2 mmol/L 4.9  Chloride 96 - 106 mmol/L 107(H)  CO2 20 - 29 mmol/L 20  Calcium 8.7 - 10.2 mg/dL 9.1  Total Protein 6.0 - 8.5 g/dL 6.4  Total Bilirubin 0.0 - 1.2 mg/dL <0.2  Alkaline Phos 39 - 117 IU/L 95  AST 0 - 40 IU/L 15  ALT 0 - 32 IU/L 20   Edinburgh Score: Edinburgh Postnatal Depression Scale Screening Tool 11/12/2018  I have been able to laugh and see the funny side of things. 0  I have looked forward with enjoyment to things. 0  I have blamed myself unnecessarily when things went wrong. 0  I have been anxious or worried for no good reason. 0  I have felt scared or  panicky for no good reason. 0  Things have been getting on top of me. 0  I have been so unhappy that I have had difficulty sleeping. 0  I have felt sad or miserable. 0  I have been so unhappy that I have been crying. 0  The thought of harming myself has occurred to me. 0  Edinburgh Postnatal Depression Scale Total 0     After visit meds:  Allergies as of 03/30/2020   No Known Allergies     Medication List    STOP taking these medications   aspirin 81 MG chewable tablet   metroNIDAZOLE 500 MG tablet Commonly known as: FLAGYL   terconazole 0.8 % vaginal cream Commonly known as: TERAZOL 3     TAKE these medications   Blood Pressure Cuff Misc 1 Device by Does not apply route once a week.   cyclobenzaprine 10 MG  tablet Commonly known as: FLEXERIL Take 1 tablet (10 mg total) by mouth 3 (three) times daily as needed for muscle spasms.   ibuprofen 600 MG tablet Commonly known as: ADVIL Take 1 tablet (600 mg total) by mouth every 8 (eight) hours as needed for mild pain.   omeprazole 20 MG capsule Commonly known as: PriLOSEC Take 1 capsule (20 mg total) by mouth 2 (two) times daily before a meal.   Vitafol-Nano 18-0.6-0.4 MG Tabs Take 1 tablet by mouth daily.   Vitamin D (Ergocalciferol) 1.25 MG (50000 UNIT) Caps capsule Commonly known as: DRISDOL Take 1 capsule (50,000 Units total) by mouth every 7 (seven) days.        Discharge home in stable condition Infant Feeding: Bottle and Breast Infant Disposition:home with mother Discharge instruction: per After Visit Summary and Postpartum booklet. Activity: Advance as tolerated. Pelvic rest for 6 weeks.  Diet: routine diet Future Appointments: Future Appointments  Date Time Provider [place]  04/27/2020 10:15 AM Nugent, Gerrie Nordmann, NP [facility] None   Follow up Visit:  Please schedule this patient for a In person postpartum visit in 4 weeks with the following provider: Any provider (female only) Additional Postpartum F/U:IUD placement  Low risk pregnancy complicated by: hx IUFD Delivery mode:  Vaginal, Spontaneous  Anticipated Birth Control:  IUD   03/30/2020 Chauncey Mann, MD

## 2020-03-28 NOTE — Progress Notes (Signed)
Labor Progress Note Kimbly Lailynn Southgate is a 29 y.o. [phone] at [redacted]w[redacted]d presented for IOL for hx IUFD  S:  Comfortable with epidural.  O:  BP 113/82   Pulse 77   Temp 97.8 F (36.6 C) (Oral)   Resp 16   Ht 5\' 4"  (1.626 m)   Wt 86.8 kg   LMP 07/06/2019 (Exact Date)   BMI 32.84 kg/m  EFM: baseline 130 bpm/ mod variability/ + accels/ no decels  Toco/IUPC: 1.5-3 SVE: Dilation: 5 Effacement (%): 70 Station: -2 Presentation: Vertex Exam by:: Rhema Boyett, CNM  A/P: 29 y.o. 38 [redacted]w[redacted]d  1. Labor: latent 2. FWB: Cat I 3. Pain: epidural 4. GBS pos>PCN  FB out, s/p Cytotec, will start Pitocin. Anticipate labor progress and SVD.  [redacted]w[redacted]d, CNM 3:58 PM

## 2020-03-28 NOTE — Plan of Care (Signed)

## 2020-03-28 NOTE — H&P (Signed)
OBSTETRIC ADMISSION HISTORY AND PHYSICAL  Jacqueline Irwin is a 29 y.o. female [phone] with IUP at [redacted]w[redacted]d presenting for IOL for hx IUFD. She reports +FMs. No LOF, VB, blurry vision, headaches, peripheral edema, or RUQ pain. She plans on breast and bottle feeding. She requests Paragard IUD for birth control.  Dating: By LMP --->  Estimated Date of Delivery: 04/11/20  Sono:    @[redacted]w[redacted]d , normal anatomy, ceph presentation, 2318g, 20%ile, EFW 5'2  Prenatal History/Complications: - hx of IUFD at 36w in - hx of PEC in previous pregnancy - Obesity - Language barrier - GBS carrier  Past Medical History: Past Medical History:  Diagnosis Date  . Hx of preeclampsia, prior pregnancy, currently pregnant    first pregnancy  . Pregnancy induced hypertension    Past Surgical History: Past Surgical History:  Procedure Laterality Date  . abcess of vulva    . APPENDECTOMY     Obstetrical History: OB History    Gravida  5   Para  3   Term  2   Preterm  1   AB  1   Living  2     SAB  1   TAB      Ectopic      Multiple  0   Live Births  2           Social History: Social History   Socioeconomic History  . Marital status: Married    Spouse name: Not on file  . Number of children: Not on file  . Years of education: Not on file  . Highest education level: Not on file  Occupational History  . Not on file  Tobacco Use  . Smoking status: Never Smoker  . Smokeless tobacco: Never Used  Substance and Sexual Activity  . Alcohol use: No  . Drug use: No  . Sexual activity: Not Currently    Birth control/protection: None  Other Topics Concern  . Not on file  Social History Narrative  . Not on file   Social Determinants of Health   Financial Resource Strain:   . Difficulty of Paying Living Expenses:   Food Insecurity:   . Worried About [place] in the Last Year:   . Programme researcher, broadcasting/film/video in the Last Year:   Transportation Needs:   . Barista (Medical):   Automotive engineer Lack of Transportation (Non-Medical):   Physical Activity:   . Days of Exercise per Week:   . Minutes of Exercise per Session:   Stress:   . Feeling of Stress :   Social Connections:   . Frequency of Communication with Friends and Family:   . Frequency of Social Gatherings with Friends and Family:   . Attends Religious Services:   . Active Member of Clubs or Organizations:   . Attends [employer] Meetings:   Banker Marital Status:     Family History: Family History  Problem Relation Age of Onset  . Diabetes Mother   . Diabetes Paternal Grandmother   . Hypertension Paternal Grandmother     Allergies: No Known Allergies  Medications Prior to Admission  Medication Sig Dispense Refill Last Dose  . aspirin 81 MG chewable tablet Chew 1 tablet (81 mg total) by mouth daily. (Patient not taking: Reported on 03/23/2020) 30 tablet 12   . Blood Pressure Monitoring (BLOOD PRESSURE CUFF) MISC 1 Device by Does not apply route once a week. 1 each 0   . metroNIDAZOLE (FLAGYL)  500 MG tablet Take 1 tablet (500 mg total) by mouth 2 (two) times daily. 14 tablet 0   . omeprazole (PRILOSEC) 20 MG capsule Take 1 capsule (20 mg total) by mouth 2 (two) times daily before a meal. 60 capsule 3   . Prenatal-Fe Fum-Methf-FA w/o A (VITAFOL-NANO) 18-0.6-0.4 MG TABS Take 1 tablet by mouth daily. 30 tablet 12   . terconazole (TERAZOL 3) 0.8 % vaginal cream Place 1 applicator vaginally at bedtime. Apply nightly for three nights. 20 g 0   . Vitamin D, Ergocalciferol, (DRISDOL) 1.25 MG (50000 UNIT) CAPS capsule Take 1 capsule (50,000 Units total) by mouth every 7 (seven) days. 30 capsule 2      Review of Systems:  All systems reviewed and negative except as stated in HPI  PE: Blood pressure 119/85, pulse 76, temperature 97.8 F (36.6 C), temperature source Oral, resp. rate 18, height 5\' 4"  (1.626 m), weight 86.8 kg, last menstrual period 07/06/2019, currently  breastfeeding. General appearance: alert, cooperative and no distress Lungs: regular rate and effort Heart: regular rate  Abdomen: soft, non-tender Extremities: Homans sign is negative, no sign of DVT Presentation: cephalic EFM: 440 bpm, mod variability, + accels, no decels Toco: rare Dilation: 1.5 Exam by:: Drake Leach, CNM SVE: 1.5/50/-2, vtx  Prenatal labs: ABO, Rh: --/--/PENDING (05/24 3474) Antibody: PENDING (05/24 0856) Rubella: 15.20 (12/15 0951) RPR: Non Reactive (03/10 0853)  HBsAg: Negative (12/15 0951)  HIV: Non Reactive (03/10 0853)  GBS: Positive/-- (05/12 1043)  2 hr GTT nml  Prenatal Transfer Tool  Maternal Diabetes: No Genetic Screening: Normal Maternal Ultrasounds/Referrals: Normal Fetal Ultrasounds or other Referrals:  None Maternal Substance Abuse:  No Significant Maternal Medications:  None Significant Maternal Lab Results: Group B Strep positive  Results for orders placed or performed during the hospital encounter of 03/28/20 (from the past 24 hour(s))  Type and screen   Collection Time: 03/28/20  8:56 AM  Result Value Ref Range   ABO/RH(D) PENDING    Antibody Screen PENDING    Sample Expiration      03/31/2020,2359 Performed at [facility], [address], [place], [place] [address]     Patient Active Problem List   Diagnosis Date Noted  . History of IUFD 03/28/2020  . Supervision of high risk pregnancy, antepartum, unspecified trimester 10/20/2019  . History of stillbirth 08/25/2018  . Maternal varicella, non-immune 02/07/2017  . Non-English speaking patient 02/06/2017  . Lichen sclerosus 38/75/6433  . 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 Hx of preeclampsia, prior pregnancy, currently pregnant 02/06/2017    Assessment: Jacqueline Irwin is a 29 y.o. [id number] at [redacted]w[redacted]d here for IOL for hx IUFD 1. Labor: IOL 2. FWB: Cat I 3. Pain: analgesia/anesthesia prn 4. GBS: pos   Plan: Admit to LD IOL with FB and Cytotec PCN  for GBS  Anticipate SVD  Video interpreter present for encounter  Julianne Handler, CNM  03/28/2020, 9:46 AM

## 2020-03-28 NOTE — Anesthesia Procedure Notes (Signed)
Epidural Patient location during procedure: OB Start time: 03/28/2020 3:23 PM End time: 03/28/2020 3:25 PM  Staffing Anesthesiologist: Leilani Able, MD Performed: anesthesiologist   Preanesthetic Checklist Completed: patient identified, IV checked, site marked, risks and benefits discussed, surgical consent, monitors and equipment checked, pre-op evaluation and timeout performed  Epidural Patient position: sitting Prep: DuraPrep and site prepped and draped Patient monitoring: continuous pulse ox and blood pressure Approach: midline Location: L3-L4 Injection technique: LOR air  Needle:  Needle type: Tuohy  Needle gauge: 17 G Needle length: 9 cm and 9 Needle insertion depth: 7 cm Catheter type: closed end flexible Catheter size: 19 Gauge Catheter at skin depth: 12 cm Test dose: negative and Other  Assessment Events: blood not aspirated, injection not painful, no injection resistance, no paresthesia and negative IV test  Additional Notes Reason for block:procedure for pain

## 2020-03-28 NOTE — Anesthesia Preprocedure Evaluation (Signed)
Anesthesia Evaluation  Patient identified by MRN, date of birth, ID band Patient awake    Reviewed: Allergy & Precautions, H&P , NPO status , Patient's Chart, lab work & pertinent test results  Airway Mallampati: I  TM Distance: >3 FB Neck ROM: full    Dental no notable dental hx. (+) Teeth Intact   Pulmonary neg pulmonary ROS,    Pulmonary exam normal breath sounds clear to auscultation       Cardiovascular Normal cardiovascular exam Rhythm:regular Rate:Normal     Neuro/Psych negative neurological ROS  negative psych ROS   GI/Hepatic Neg liver ROS, GERD  Medicated,  Endo/Other  negative endocrine ROS  Renal/GU negative Renal ROS     Musculoskeletal   Abdominal Normal abdominal exam  (+)   Peds  Hematology  (+) Blood dyscrasia, anemia ,   Anesthesia Other Findings   Reproductive/Obstetrics (+) Pregnancy                             Anesthesia Physical  Anesthesia Plan  ASA: II  Anesthesia Plan: Epidural   Post-op Pain Management:    Induction:   PONV Risk Score and Plan:   Airway Management Planned:   Additional Equipment: None  Intra-op Plan:   Post-operative Plan:   Informed Consent: I have reviewed the patients History and Physical, chart, labs and discussed the procedure including the risks, benefits and alternatives for the proposed anesthesia with the patient or authorized representative who has indicated his/her understanding and acceptance.       Plan Discussed with:   Anesthesia Plan Comments:         Anesthesia Quick Evaluation

## 2020-03-28 NOTE — Progress Notes (Signed)
Labor Progress Note Jacqueline [place] is a 29 y.o. [id number] at [redacted]w[redacted]d presented for IOL for hx IUFD  S:  Comfortable, no c/o.  O:  BP 119/85   Pulse 76   Temp 97.8 F (36.6 C) (Oral)   Resp 18   Ht 5\' 4"  (1.626 m)   Wt 86.8 kg   LMP 07/06/2019 (Exact Date)   BMI 32.84 kg/m  EFM: baseline 130 bpm/ mod variability/ + accels/ no decels  Toco/IUPC: rare SVE: Dilation: 1.5 Presentation: Vertex Exam by:: Bhambri, CNM   A/P: 29 y.o. 38 [redacted]w[redacted]d  1. Labor: IOL 2. FWB: Cat I 3. Pain: analgesia/anesthesia prn  Pt consented for FB and Cytotec. Tolerated well. Start Pitocin when appropriate. Anticipate labor progress and SVD.  [redacted]w[redacted]d, CNM 10:09 AM

## 2020-03-29 LAB — RPR: RPR Ser Ql: NONREACTIVE

## 2020-03-29 MED ORDER — OXYCODONE HCL 5 MG PO TABS
5.0000 mg | ORAL_TABLET | ORAL | Status: DC | PRN
  Administered 2020-03-29 – 2020-03-30 (×4): 5 mg via ORAL
  Filled 2020-03-29 (×4): qty 1

## 2020-03-29 NOTE — Lactation Note (Addendum)
This note was copied from a baby's chart. Lactation Consultation Note  Patient Name: Jacqueline [place] [id number] Date: 03/29/2020    Initial visit at 12 hours of life.  Virtual interpreter, Maisa [phone] was used for consult. Mom is a P3 who nursed her 2 previous children for 1 year each (those children are now 31 yrs old & 1.5 yrs old). Mom reports that her milk comes to volume on the 3rd day & she had no difficulties with nursing her other children. Mom says that this infant is nursing well. To Mom's knowledge, infant has not yet voided or stooled.  Mom has [facility] & plans to request the breast milk & formula feeding package. I explained to Mom that WIC's formula Rush Barer) is not halal. Mom stated that with her 2 previous children, WIC gave her soy formula since it was halal. I explained that soy formula may not be the best choice b/c of its estrogenic components. (Mom affirmed that [facility] provided soy formula b/c it was halal only & the pediatrician had not requested it for her infants' needs).   Mom says she can afford Similac, but she also has a friend that was able to acquire Similac through [facility]. If infant receives formula in the hospital, it will be Similac Neosure 22, which is halal.   I did remind Mom that, ideally, if she gives formula while in the hospital she should express her milk when infant receives formula. She verbalized understanding.   Note: Mom had a 36-week IUFD when living in [place].   Breastfeeding brochure was provided. I showed her the number to call if she has any questions post-discharge & we could arrange for an Arabic interpreter to assist [place] with her phone call.   Lurline Hare [facility] 03/29/2020, 8:12 AM

## 2020-03-29 NOTE — Progress Notes (Addendum)
Post Partum Day 1  Subjective:  Jacqueline Irwin is a 28 y.o. [id number] [redacted]w[redacted]d s/p vaginal delivery.  No acute events overnight.  Pt denies problems with voiding or po intake. Patient reports she has not been able to ambulate due to heaviness in her left leg but she is able to move the LLE.   She denies nausea or vomiting.  Pain is well controlled.  She has not had flatus. She has not had bowel movement.  Lochia Small.  Plan for birth control is IUD.  Method of Feeding: breast and bottle feeding, both are going well.   Objective: BP 108/70 (BP Location: Right Arm)   Pulse 83   Temp 98.6 F (37 C) (Oral)   Resp 16   Ht 5\' 4"  (1.626 m)   Wt 86.8 kg   LMP 07/06/2019 (Exact Date)   SpO2 100%   Breastfeeding Unknown   BMI 32.84 kg/m   Physical Exam:  General: alert, cooperative and no distress Lochia:normal flow Chest: CTAB Heart: RRR no m/r/g Abdomen: +BS, soft, nontender, fundus firm at/below umbilicus Uterine Fundus: firm, at the level of umbilicus  DVT Evaluation: No evidence of DVT seen on physical exam. Extremities: no LE edema  Recent Labs    03/28/20 0856  HGB 10.0*  HCT 32.9*    Assessment/Plan:  ASSESSMENT: Jacqueline Irwin is a 29 y.o. 38 [redacted]w[redacted]d ppd #1 s/p NSVD doing well.   Plan for discharge tomorrow, Breastfeeding and Contraception IUD as outpatient    LOS: 1 day   [redacted]w[redacted]d 03/29/2020, 7:48 AM   I saw and evaluated the patient. I agree with the findings and the plan of care as documented in the resident's note. Hx of Pre-E, will monitor BP's closely.   03/31/2020, MD [facility] Family Medicine Fellow, [facility] for [facility]

## 2020-03-29 NOTE — Lactation Note (Signed)
This note was copied from a baby's chart. Lactation Consultation Note Baby 4 hrs old. Mom sleeping. FOB woke up when LC entered rm. LC introduced self. FOB stated baby feeding good. RN told LC baby BF well and has BF several times. Mom's 3rd child, mom BF her other 2 children.  Patient Name: Jacqueline Irwin [id number] Date: 03/29/2020     Maternal Data    Feeding Feeding Type: Breast Fed  LATCH Score Latch: Grasps breast easily, tongue down, lips flanged, rhythmical sucking.  Audible Swallowing: Spontaneous and intermittent  Type of Nipple: Everted at rest and after stimulation  Comfort (Breast/Nipple): Soft / non-tender  Hold (Positioning): Assistance needed to correctly position infant at breast and maintain latch.  LATCH Score: 9  Interventions    Lactation Tools Discussed/Used     Consult Status      Jacqueline [place], [name] 03/29/2020, 12:57 AM

## 2020-03-29 NOTE — Anesthesia Postprocedure Evaluation (Signed)
Anesthesia Post Note  Patient: Jacqueline Irwin  Procedure(s) Performed: AN AD HOC LABOR EPIDURAL     Patient location during evaluation: Mother Baby Anesthesia Type: Epidural Level of consciousness: awake and alert Pain management: pain level controlled Vital Signs Assessment: post-procedure vital signs reviewed and stable Respiratory status: spontaneous breathing, nonlabored ventilation and respiratory function stable Cardiovascular status: stable Postop Assessment: no headache, no backache and epidural receding Anesthetic complications: no    Last Vitals:  Vitals:   03/28/20 2245 03/28/20 2350  BP: 115/81 117/78  Pulse: 75 84  Resp: 16 16  Temp: 36.8 C 36.8 C  SpO2: 100% 100%    Last Pain:  Vitals:   03/28/20 2351  TempSrc:   PainSc: 0-No pain   Pain Goal:                   Saed Hudlow

## 2020-03-30 MED ORDER — IBUPROFEN 600 MG PO TABS: 600.0000 mg | ORAL_TABLET | Freq: Three times a day (TID) | ORAL | 0 refills | Status: DC | PRN

## 2020-03-30 MED ORDER — CYCLOBENZAPRINE HCL 10 MG PO TABS: 10.0000 mg | ORAL_TABLET | Freq: Three times a day (TID) | ORAL | 0 refills | Status: DC | PRN

## 2020-03-30 NOTE — Discharge Instructions (Signed)

## 2020-04-05 ENCOUNTER — Ambulatory Visit: Payer: Medicaid Other

## 2020-04-27 ENCOUNTER — Other Ambulatory Visit (HOSPITAL_COMMUNITY)
Admission: RE | Admit: 2020-04-27 | Discharge: 2020-04-27 | Disposition: A | Discharge status: Home / Self Care | Payer: Medicaid Other | Source: Ambulatory Visit | Attending: Women's Health | Admitting: Women's Health

## 2020-04-27 ENCOUNTER — Other Ambulatory Visit: Payer: Self-pay

## 2020-04-27 ENCOUNTER — Ambulatory Visit (INDEPENDENT_AMBULATORY_CARE_PROVIDER_SITE_OTHER): Payer: Medicaid Other | Admitting: Women's Health

## 2020-04-27 ENCOUNTER — Encounter: Payer: Self-pay | Admitting: Women's Health

## 2020-04-27 DIAGNOSIS — N898 Other specified noninflammatory disorders of vagina: Secondary | ICD-10-CM

## 2020-04-27 NOTE — Progress Notes (Signed)
    Post Partum Visit Note  Jacqueline Irwin is a 29 y.o. [phone] female who presents for a postpartum visit. She is 4 weeks postpartum following a normal spontaneous vaginal delivery.  I have fully reviewed the prenatal and intrapartum course. The delivery was at [redacted]w[redacted]d gestational weeks.  Anesthesia: epidural. Postpartum course has been ok c/o vaginal discharge with itching and odor. Baby is doing well. Baby is feeding by both breast and bottle - Similac Isomil and Enfamil Gentlease . Bleeding no bleeding. Bowel function is normal. Bladder function is normal. Patient is not sexually active. Contraception method is requests Paragard insertion. Postpartum depression screening: negative. EPDS = 0  The following portions of the patient's history were reviewed and updated as appropriate: allergies, current medications, past family history, past medical history, past social history, past surgical history and problem list.  Review of Systems Pertinent items noted in HPI and remainder of comprehensive ROS otherwise negative.    Objective:  Blood pressure 105/75, pulse 93, weight 188 lb 3.2 oz (85.4 kg), unknown if currently breastfeeding.  General:  alert, cooperative and no distress   Breasts:  pt declines  Lungs: clear to auscultation bilaterally  Heart:  regular rate and rhythm, S1, S2 normal, no murmur, click, rub or gallop  Abdomen: soft, non-tender; bowel sounds normal; no masses,  no organomegaly   Vulva:  right labia minora absent  Vagina: lochia present, no odor  Cervix:  no cervical motion tenderness and no lesions  Corpus: normal, soft, mobile and c/w 4 week ppartum state  Adnexa:  no mass, fullness, tenderness  Rectal Exam: Not performed.        Assessment:    Normal postpartum exam. Pap smear done at today's visit.   Plan:   Essential components of care per ACOG recommendations:  1.  Mood and well being: Patient with negative depression screening today. Reviewed local  resources for support.  - Patient does not use tobacco. - hx of drug use? No  2. Infant care and feeding:  -Patient currently breastmilk feeding? No -Social determinants of health (SDOH) reviewed in EPIC. No concerns  3. Sexuality, contraception and birth spacing - Patient does not want a pregnancy in the next year. - Reviewed forms of contraception in tiered fashion. Patient desired IUD today. Patient desires Paragard, counseling provided on side effects/risks/benefits, including risks of insertion at 4 weeks with breastfeeding and active vaginal infection. Patient elects to wait for insertion until after results of swab, is OK to wait to have intercourse until after insertion, discussed Paragard is immediately effective after insertion. - Discussed birth spacing of 18 months - patient reports vaginal itching and odor starting about 5-6 days ago, pt denies douching, reports has had BV/yeast in past and this feels the same  4. Sleep and fatigue -Encouraged family/partner/community support of 4 hrs of uninterrupted sleep to help with mood and fatigue  5. Physical Recovery  - Discussed patients delivery and complications - Patient had a first degree laceration, perineal healing reviewed. Patient expressed understanding - Patient has urinary incontinence? No - Patient is not safe to resume physical and sexual activity - needs to wait for sex until Paragard inserted  6.  Health Maintenance - Last pap smear done 02/05/2017 and was normal with negative HPV.  7. Chronic Disease - PCP follow up - PCP list given  Marylen Ponto, NP Center for [employer], [facility]

## 2020-04-27 NOTE — Patient Instructions (Addendum)
AREA FAMILY PRACTICE PHYSICIANS  [place] ([address]) . [facility] o [address] [place], [place] [address] o [phone] o Mon-Fri 8:30-12:30, 1:30-5:00 o Accepting Medicaid . [facility] o [address], [place], [place] [address] o [phone] o Mon-Fri 8:00-5:30 . [facility] o [address] [place], [place] [address] o [phone] o Mon, Tue, Thur, Fri 8:30-5:00, Wed 10:00-7:00 (closed 1-2pm) o Accepting Medicaid . [facility] o [address], [place], [place]  [address] o Phone - [phone]   Fax - [phone]  [place] ([address]) . [facility] o [address] [place], [place] [address] o [phone] o Mon-Fri 8:00-5:00 . [facility] [place] [facility]  o [address] [place], [place] [address] o [phone] o Mon-Fri 8:30-5:30, Sat (Oct.-Mar.) 9:00-1:00 o Accepting Medicaid  [place] ([address]) . [facility] o [address], [place], [place] [address] o [phone] o Mon-Fri 8:00-5:00  [place] ([address]) . [facility] [employer] o [address], [place], [place] [address] o [phone] o Mon-Fri 8:00-5:00 . [facility] o [address], [place], [place] [address] o [phone] o Mon-Fri 8:00-5:00 . [facility] [place] [facility] o [address] [place], [place] [address] o [phone] o Mon-Fri 8:00-5:00 . [facility] o [address] [place] [address] o [phone] o Mon-Fri 7:30-5:30  [place] ([address] & [address]) . [facility] o [address] [place], [place] [address] o [phone] o Mon-Thur 8:00-6:00 o Accepting Medicaid . [facility] o [address] [place], [place] [address] o [phone] o Mon-Thur 7:30-7:30, Fri 7:30-4:30 o Accepting Medicaid . [facility] o [address], [place], [place]  [address] o [phone]   Fax - [phone]  [place] ([address] & [address]) . [facility] o [address] [place], [place] [address] o [phone] o Mon-Fri 7:00-5:00 . [facility] o [address], [place], [place] [address] o [phone] o Mon-Fri 8:00-5:00 o Accepting Medicaid . [facility] o [address],  [place], [place] [address] o [phone] o Mon-Fri 8:00-5:00 o Accepting Medicaid  [place] ([address]) . [facility] [place] o [address] [place], [place] [address] o [phone] o Mon-Fri 8:00-5:00 . [facility] o [address], [place], [place] [address] o [phone] o Mon-Fri 8:00-5:00 o Accepting Medicaid . [facility] o [address], [place], [place] [address] o [phone] o Mon-Fri 8:00-5:30, Sat&Sun by appointment (phones open at 8:30) o Accepting Medicaid  [place] ([address]) . [facility] o [address] [place], [place] [address] o [phone] o Mon-Thur 8:00-7:00, Fri 8:00-5:00, Sat 8:00-12:00, Sun 9:00-12:00 o Accepting Medicaid . [facility] [place] o [address], [place], [place] [address] o [phone] o Mon-Thur 8:00-5:00 o Accepting Medicaid . [facility] o [address] [place],  [address] o [phone] o Mon-Fri 8:00-5:30, Sat (Oct.-Mar.) 9:00-1:00 o Accepting Medicaid  [place]  ([address]) .   [facility] o [address] [place], [place] [address] o [phone] o Mon-Fri 8:00-5:00 o Accepting Medicaid   [facility] [phone]) . [facility] at [facility] o [address], [place], [place] [address] o [phone] o Mon-Fri 8:00-5:00 . Nature conservation officer at [facility] o [address], [place], [place] [address] o [phone] o Mon-Fri 8:00-5:00 . [facility] [place] o [address], [place], [place] [address] o [phone] o Mon-Fri 8:00-5:00 o After hours clinic [place] [address] [place], [place] [address]) [phone] Mon-Fri 5:00-8:00, Sat 12:00-6:00, Sun 10:00-4:00 o Accepting Medicaid . [facility] at [facility] o [address] [place], [place]  [address] o [phone]   Fax - [phone]  [place] [phone]) . Nature conservation officer at [facility] o [address] [place] [address] [place], [place] [address] o [phone] o Mon-Fri 8:00-5:00 . [facility] [place] [facility] [place]) o [address] [place], [place] [address] o [phone] o Mon-Thur 8:00-7:00, Fri 8:00-5:00, Sat 8:00-12:00         Intrauterine Device Information An intrauterine device (IUD) is a medical device that is inserted in the uterus to prevent pregnancy. It is a small, T-shaped device that has one or two nylon strings hanging down from it. The strings hang out of the lower part of the uterus (cervix) to allow for future IUD removal. There are two types of IUDs available:  Hormone IUD. This type of IUD is made of plastic and contains the hormone progestin (synthetic progesterone). A hormone IUD may last 3-5 years.  Copper IUD. This type of IUD has copper wire wrapped around it. A copper IUD may last up to 10 years. How is an IUD inserted? An IUD is inserted through the vagina and placed into the uterus with a minor medical procedure. The exact  procedure for IUD insertion may vary among health care providers and hospitals. How does an IUD work? Synthetic progesterone in a hormonal IUD prevents pregnancy by:  Thickening cervical mucus to prevent sperm from entering the uterus.  Thinning the uterine lining to prevent a fertilized egg from being implanted there. Copper in a copper IUD prevents pregnancy by making the uterus and fallopian tubes produce a fluid that kills sperm. What are the advantages of an IUD? Advantages of either type of IUD  It is highly effective in preventing pregnancy.  It is reversible. You can become pregnant shortly after the IUD is removed.  It is low-maintenance and can stay in place for a long time.  There are no estrogen-related side effects.  It can be used when breastfeeding.  It is not associated with weight gain.  It can be inserted right after childbirth, an abortion, or a miscarriage. Advantages of a hormone IUD  If it is inserted within 7 days of your period starting, it works right after it is inserted. If the hormone IUD is inserted at any other time in your cycle, you will need to use a backup method of birth control for 7 days after insertion.  It can make menstrual periods lighter.  It can reduce menstrual cramping.  It can be used for 3-5 years. Advantages of a copper IUD  It works right after it is inserted.  It can be used as a form of emergency birth control if it is inserted within 5 days after having unprotected sex.  It does not  interfere with your body's natural hormones.  It can be used for 10 years. What are the disadvantages of an IUD?  An IUD may cause irregular menstrual bleeding for a period of time after insertion.  You may have pain during insertion and have cramping and vaginal bleeding after insertion.  An IUD may cut the uterus (uterine perforation) when it is inserted. This is rare.  An IUD may cause pelvic inflammatory disease (PID), which is an  infection in the uterus and fallopian tubes. This is rare, and it usually happens during the first 20 days after the IUD is inserted.  A copper IUD can make your menstrual flow heavier and more painful. How is an IUD removed?  You will lie on your back with your knees bent and your feet in footrests (stirrups).  A device will be inserted into your vagina to spread apart the vaginal walls (speculum). This will allow your health care provider to see the strings attached to the IUD.  Your health care provider will use a small instrument (forceps) to grasp the IUD strings and pull firmly until the IUD is removed. You may have some discomfort when the IUD is removed. Your health care provider may recommend taking over-the-counter pain relievers, such as ibuprofen, before the procedure. You may also have minor spotting for a few days after the procedure. The exact procedure for IUD removal may vary among health care providers and hospitals. Is the IUD right for me? Your health care provider will make sure you are a good candidate for an IUD and will discuss the advantages, disadvantages, and possible side effects with you. Summary  An intrauterine device (IUD) is a medical device that is inserted in the uterus to prevent pregnancy. It is a small, T-shaped device that has one or two nylon strings hanging down from it.  A hormone IUD contains the hormone progestin (synthetic progesterone). A copper IUD has copper wire wrapped around it.  Synthetic progesterone in a hormone IUD prevents pregnancy by thickening cervical mucus and thinning the walls of the uterus. Copper in a copper IUD prevents pregnancy by making the uterus and fallopian tubes produce a fluid that kills sperm.  A hormone IUD can be left in place for 3-5 years. A copper IUD can be left in place for up to 10 years.  An IUD is inserted and removed by a health care provider. You may feel some pain during insertion and removal. Your health  care provider may recommend taking over-the-counter pain medicine, such as ibuprofen, before an IUD procedure. This information is not intended to replace advice given to you by your health care provider. Make sure you discuss any questions you have with your health care provider. Document Revised: 10/04/2017 Document Reviewed: 11/20/2016 Elsevier Patient Education  [place].

## 2020-04-28 LAB — CERVICOVAGINAL ANCILLARY ONLY
Bacterial Vaginitis (gardnerella): POSITIVE — AB
Candida Glabrata: NEGATIVE
Candida Vaginitis: NEGATIVE
Chlamydia: NEGATIVE
Comment: NEGATIVE
Comment: NEGATIVE
Comment: NEGATIVE
Comment: NEGATIVE
Comment: NEGATIVE
Comment: NORMAL
Neisseria Gonorrhea: NEGATIVE
Trichomonas: NEGATIVE

## 2020-04-28 LAB — CYTOLOGY - PAP
Comment: NEGATIVE
Diagnosis: NEGATIVE
High risk HPV: NEGATIVE

## 2020-05-20 ENCOUNTER — Ambulatory Visit: Payer: Medicaid Other | Admitting: Family Medicine

## 2020-05-24 ENCOUNTER — Ambulatory Visit (INDEPENDENT_AMBULATORY_CARE_PROVIDER_SITE_OTHER): Payer: Medicaid Other | Admitting: Obstetrics and Gynecology

## 2020-05-24 ENCOUNTER — Other Ambulatory Visit: Payer: Self-pay

## 2020-05-24 ENCOUNTER — Encounter: Payer: Self-pay | Admitting: Obstetrics and Gynecology

## 2020-05-24 VITALS — BP 119/84 | HR 82 | Wt 190.3 lb

## 2020-05-24 DIAGNOSIS — N760 Acute vaginitis: Secondary | ICD-10-CM

## 2020-05-24 DIAGNOSIS — Z3202 Encounter for pregnancy test, result negative: Secondary | ICD-10-CM

## 2020-05-24 DIAGNOSIS — B9689 Other specified bacterial agents as the cause of diseases classified elsewhere: Secondary | ICD-10-CM | POA: Diagnosis not present

## 2020-05-24 DIAGNOSIS — O0990 Supervision of high risk pregnancy, unspecified, unspecified trimester: Secondary | ICD-10-CM

## 2020-05-24 DIAGNOSIS — Z3009 Encounter for other general counseling and advice on contraception: Secondary | ICD-10-CM | POA: Diagnosis not present

## 2020-05-24 LAB — POCT URINE PREGNANCY: Preg Test, Ur: NEGATIVE

## 2020-05-24 MED ORDER — METRONIDAZOLE 0.75 % VA GEL: 1.0000 | Freq: Two times a day (BID) | VAGINAL | 0 refills | Status: DC

## 2020-05-24 MED ORDER — VITAFOL-NANO 18-0.6-0.4 MG PO TABS: 1.0000 | ORAL_TABLET | Freq: Every day | ORAL | 12 refills | Status: DC

## 2020-05-24 MED ORDER — OMEPRAZOLE 20 MG PO CPDR: 20.0000 mg | DELAYED_RELEASE_CAPSULE | Freq: Two times a day (BID) | ORAL | 3 refills | Status: DC

## 2020-05-24 NOTE — Addendum Note (Signed)
Addended by: Natale Milch D on: 05/24/2020 04:59 PM   Modules accepted: Orders

## 2020-05-24 NOTE — Progress Notes (Signed)
Pt. Request refill on meds

## 2020-05-24 NOTE — Addendum Note (Signed)
Addended by: Leola Brazil on: 05/24/2020 04:45 PM   Modules accepted: Orders

## 2020-05-24 NOTE — Progress Notes (Signed)
   Subjective:    Patient ID: Mellody Memos, female    DOB: 12/18/1990, 29 y.o.   MRN: [id number]  HPI Patient present for IUD insertion.  At her last appointment, a vaginitis panel was performed.  The results were consistent with bacterial vaginosis.  The patient has not taken the medication.  She states that the prescription was not at the pharmacy.  Currently, she complains of continued vaginal discharge and itching.     Review of Systems Other than HPI, complete review of systems is negative.    Objective:   Physical Exam Constitutional:      General: She is not in acute distress.    Appearance: Normal appearance.  HENT:     Head: Normocephalic and atraumatic.  Skin:    General: Skin is warm and dry.  Neurological:     Mental Status: She is alert.  Psychiatric:        Mood and Affect: Mood normal.        Judgment: Judgment normal.           Assessment & Plan:  Bacterial vaginosis  General counseling and advice on female contraception - Plan: POCT urine pregnancy New Rx for Metrogel sent to pharmacy. Risks of IUD insertion with active infection discussed. Patient to return in 1 week for IUD insertion.

## 2020-05-24 NOTE — Patient Instructions (Signed)

## 2020-06-09 ENCOUNTER — Other Ambulatory Visit: Payer: Self-pay

## 2020-06-09 ENCOUNTER — Ambulatory Visit (INDEPENDENT_AMBULATORY_CARE_PROVIDER_SITE_OTHER): Payer: Medicaid Other | Admitting: Obstetrics and Gynecology

## 2020-06-09 ENCOUNTER — Encounter: Payer: Self-pay | Admitting: Obstetrics and Gynecology

## 2020-06-09 VITALS — BP 116/79 | HR 79 | Wt 191.0 lb

## 2020-06-09 DIAGNOSIS — Z30430 Encounter for insertion of intrauterine contraceptive device: Secondary | ICD-10-CM | POA: Diagnosis not present

## 2020-06-09 DIAGNOSIS — Z01812 Encounter for preprocedural laboratory examination: Secondary | ICD-10-CM

## 2020-06-09 LAB — POCT URINE PREGNANCY: Preg Test, Ur: NEGATIVE

## 2020-06-09 MED ORDER — PARAGARD INTRAUTERINE COPPER IU IUD: INTRAUTERINE_SYSTEM | Freq: Once | INTRAUTERINE | Status: AC

## 2020-06-09 NOTE — Progress Notes (Signed)
    GYNECOLOGY OFFICE PROCEDURE NOTE  Marializ Ferrebee is a 29 y.o. [phone] here for paragard IUD insertion. No GYN concerns.  Last pap smear was on 04/27/20 and was normal.  IUD Insertion Procedure Note Patient identified, informed consent performed, consent signed.   Discussed risks of irregular bleeding, cramping, infection, malpositioning or misplacement of the IUD outside the uterus which may require further procedure such as laparoscopy. Also discussed >99% contraception efficacy, increased risk of ectopic pregnancy with failure of method.   Emphasized that this did not protect against STIs, condoms recommended during all sexual encounters. Time out was performed.  Urine pregnancy test negative.  Speculum placed in the vagina.  Cervix visualized.  Cleaned with Betadine x 2.  Tenaculum was not needed due to multiparous cervix. Uterus sounded to 8 cm.  paragard IUD placed per manufacturer's recommendations.  Strings trimmed to 3 cm. Tenaculum was removed, good hemostasis noted.  Patient tolerated procedure well.   Patient was given post-procedure instructions.  She was advised to have backup contraception for one week.  Patient was also asked to check IUD strings periodically and follow up in 4 weeks for IUD check.  Lot# [id number]  Casper Harrison, MD [facility] Family Medicine Fellow, [facility] for [facility], [facility]

## 2020-06-09 NOTE — Progress Notes (Signed)
Pt presents for Paragard IUD insertion Denies unprotected IC Initially used Print production planner [id number] then live interpreter arrived  UPT neg

## 2020-07-17 ENCOUNTER — Encounter: Payer: Self-pay | Admitting: Nurse Practitioner

## 2020-07-17 DIAGNOSIS — Z975 Presence of (intrauterine) contraceptive device: Secondary | ICD-10-CM | POA: Insufficient documentation

## 2020-07-18 ENCOUNTER — Encounter: Payer: Self-pay | Admitting: Nurse Practitioner

## 2020-07-18 ENCOUNTER — Ambulatory Visit (INDEPENDENT_AMBULATORY_CARE_PROVIDER_SITE_OTHER): Payer: Medicaid Other | Admitting: Nurse Practitioner

## 2020-07-18 ENCOUNTER — Other Ambulatory Visit: Payer: Self-pay

## 2020-07-18 VITALS — BP 125/79 | HR 94 | Wt 201.4 lb

## 2020-07-18 DIAGNOSIS — O0990 Supervision of high risk pregnancy, unspecified, unspecified trimester: Secondary | ICD-10-CM | POA: Diagnosis not present

## 2020-07-18 DIAGNOSIS — Z30431 Encounter for routine checking of intrauterine contraceptive device: Secondary | ICD-10-CM

## 2020-07-18 DIAGNOSIS — R12 Heartburn: Secondary | ICD-10-CM | POA: Diagnosis not present

## 2020-07-18 DIAGNOSIS — Z789 Other specified health status: Secondary | ICD-10-CM | POA: Diagnosis not present

## 2020-07-18 DIAGNOSIS — Z6834 Body mass index (BMI) 34.0-34.9, adult: Secondary | ICD-10-CM

## 2020-07-18 MED ORDER — OMEPRAZOLE 20 MG PO CPDR: 20.0000 mg | DELAYED_RELEASE_CAPSULE | Freq: Two times a day (BID) | ORAL | 3 refills | Status: DC

## 2020-07-18 NOTE — Progress Notes (Signed)
    GYNECOLOGY OFFICE ENCOUNTER NOTE  History:  29 y.o. [id number] here today for today for IUD string check; Paragard  IUD was placed  06-09-20. No complaints about the IUD, no concerning side effects.  Interpreter present for the entire visit.  The following portions of the patient's history were reviewed and updated as appropriate: allergies, current medications, past family history, past medical history, past social history, past surgical history and problem list. Last pap smear on 04-27-20 was normal, negative HRHPV.  Review of Systems:  Pertinent items are noted in HPI.   Objective:  Physical Exam Blood pressure 125/79, pulse 94, weight 201 lb 6.4 oz (91.4 kg), currently breastfeeding. CONSTITUTIONAL: Well-developed, well-nourished female in no acute distress.  HENT:  Normocephalic, atraumatic. External right and left ear normal. Oropharynx is clear and moist EYES: Conjunctivae and EOM are normal. Pupils are equal, round, and reactive to light. No scleral icterus.  NECK: Normal range of motion, supple, no masses CARDIOVASCULAR: Normal heart rate noted RESPIRATORY: Effort and breath sounds normal, no problems with respiration noted ABDOMEN: Soft, no distention noted.   PELVIC: Normal appearing external genitalia; normal appearing vaginal mucosa and cervix.  IUD strings visualized, about 3 cm in length outside cervix. One thread was longer so it was trimmed so they were approx the same length.  Assessment & Plan:  Patient to keep IUD in place for up to seven years; can come in for removal if she desires pregnancy earlier or for or concerning side effects. Annual exam in one year. Refilled prilosec RX at client's request If heartburn continues, will need to see Family Practice or Primary Care for additional refills.  Nolene Bernheim, RN, MSN, NP-BC Nurse Practitioner, [facility] for [employer], [facility] 07/18/2020 10:09 AM

## 2020-07-18 NOTE — Progress Notes (Signed)
Pt is in the office for IUD string check. Pt requests refill on Prilosec.

## 2020-09-22 ENCOUNTER — Other Ambulatory Visit: Payer: Self-pay

## 2020-09-22 MED ORDER — TERCONAZOLE 0.4 % VA CREA
1.0000 | TOPICAL_CREAM | Freq: Every day | VAGINAL | 0 refills | Status: DC
Start: 2020-09-22 — End: 2021-08-17

## 2020-09-22 NOTE — Telephone Encounter (Signed)
Patient thinks she has yeast infection and would like for terazol  cream to be called into her pharmacy.

## 2021-05-29 ENCOUNTER — Ambulatory Visit: Payer: Medicaid Other | Admitting: Advanced Practice Midwife

## 2021-06-05 ENCOUNTER — Other Ambulatory Visit: Payer: Self-pay

## 2021-06-05 ENCOUNTER — Encounter: Payer: Self-pay | Admitting: Obstetrics and Gynecology

## 2021-06-05 ENCOUNTER — Ambulatory Visit (INDEPENDENT_AMBULATORY_CARE_PROVIDER_SITE_OTHER): Payer: Medicaid Other | Admitting: Obstetrics and Gynecology

## 2021-06-05 VITALS — BP 123/83 | HR 100 | Ht 64.0 in | Wt 190.0 lb

## 2021-06-05 DIAGNOSIS — Z30432 Encounter for removal of intrauterine contraceptive device: Secondary | ICD-10-CM

## 2021-06-05 DIAGNOSIS — O0990 Supervision of high risk pregnancy, unspecified, unspecified trimester: Secondary | ICD-10-CM

## 2021-06-05 DIAGNOSIS — R12 Heartburn: Secondary | ICD-10-CM

## 2021-06-05 DIAGNOSIS — Z30011 Encounter for initial prescription of contraceptive pills: Secondary | ICD-10-CM

## 2021-06-05 MED ORDER — OMEPRAZOLE 20 MG PO CPDR: 20.0000 mg | DELAYED_RELEASE_CAPSULE | Freq: Two times a day (BID) | ORAL | 3 refills | Status: DC

## 2021-06-05 MED ORDER — SLYND 4 MG PO TABS: 1.0000 | ORAL_TABLET | Freq: Every day | ORAL | 11 refills | Status: DC

## 2021-06-05 NOTE — Progress Notes (Addendum)
GYNECOLOGY CLINIC PROCEDURE NOTE  Jacqueline Irwin is a 30 y.o. [phone] here for IUD removal secondary to heavy menstrual cycles. Patient had a Paraguard IUD inserted in 2021. No GYN concerns.  Last pap smear was on 04/2020 and was normal.  IUD Removal  Patient identified, informed consent performed, consent signed.  Patient was in the dorsal lithotomy position, normal external genitalia was noted.  A speculum was placed in the patient's vagina, normal discharge was noted, no lesions. The cervix was visualized, no lesions, no abnormal discharge.  The strings of the IUD were grasped and pulled using ring forceps. The IUD was removed in its entirety.  Patient tolerated the procedure well.    Patient will use POP for contraception, Rx slynd provided.  Routine preventative health maintenance measures emphasized.

## 2021-06-05 NOTE — Progress Notes (Signed)
RGYN patient presents for IUD Removal pt rather take OCP's.  BT:DHRCBULAG periods cycle lasting 15 days and cramps.  Pt has refill request for prilosec .

## 2021-07-23 ENCOUNTER — Other Ambulatory Visit: Payer: Self-pay | Admitting: Obstetrics and Gynecology

## 2021-07-23 DIAGNOSIS — R12 Heartburn: Secondary | ICD-10-CM

## 2021-08-17 ENCOUNTER — Encounter (HOSPITAL_COMMUNITY): Payer: Self-pay | Admitting: *Deleted

## 2021-08-17 ENCOUNTER — Inpatient Hospital Stay (HOSPITAL_COMMUNITY)
Admission: AD | Admit: 2021-08-17 | Discharge: 2021-08-17 | Disposition: A | Discharge status: Home / Self Care | Payer: Medicaid Other | Attending: Obstetrics and Gynecology | Admitting: Obstetrics and Gynecology

## 2021-08-17 ENCOUNTER — Inpatient Hospital Stay (HOSPITAL_COMMUNITY): Payer: Medicaid Other

## 2021-08-17 DIAGNOSIS — Z3A01 Less than 8 weeks gestation of pregnancy: Secondary | ICD-10-CM | POA: Diagnosis not present

## 2021-08-17 DIAGNOSIS — O209 Hemorrhage in early pregnancy, unspecified: Secondary | ICD-10-CM | POA: Diagnosis not present

## 2021-08-17 LAB — WET PREP, GENITAL
Clue Cells Wet Prep HPF POC: NONE SEEN
Sperm: NONE SEEN
Trich, Wet Prep: NONE SEEN
Yeast Wet Prep HPF POC: NONE SEEN

## 2021-08-17 LAB — URINALYSIS, ROUTINE W REFLEX MICROSCOPIC
Bilirubin Urine: NEGATIVE
Glucose, UA: NEGATIVE mg/dL
Hgb urine dipstick: NEGATIVE
Ketones, ur: NEGATIVE mg/dL
Leukocytes,Ua: NEGATIVE
Nitrite: NEGATIVE
Protein, ur: NEGATIVE mg/dL
Specific Gravity, Urine: 1.009 (ref 1.005–1.030)
pH: 5 (ref 5.0–8.0)

## 2021-08-17 LAB — CBC
HCT: 31.9 % — ABNORMAL LOW (ref 36.0–46.0)
Hemoglobin: 10.3 g/dL — ABNORMAL LOW (ref 12.0–15.0)
MCH: 25.3 pg — ABNORMAL LOW (ref 26.0–34.0)
MCHC: 32.3 g/dL (ref 30.0–36.0)
MCV: 78.4 fL — ABNORMAL LOW (ref 80.0–100.0)
Platelets: 280 10*3/uL (ref 150–400)
RBC: 4.07 MIL/uL (ref 3.87–5.11)
RDW: 14.9 % (ref 11.5–15.5)
WBC: 11.2 10*3/uL — ABNORMAL HIGH (ref 4.0–10.5)
nRBC: 0 % (ref 0.0–0.2)

## 2021-08-17 LAB — HCG, QUANTITATIVE, PREGNANCY: hCG, Beta Chain, Quant, S: 61340 m[IU]/mL — ABNORMAL HIGH (ref <5)

## 2021-08-17 LAB — POCT PREGNANCY, URINE: Preg Test, Ur: POSITIVE — AB

## 2021-08-17 NOTE — MAU Provider Note (Signed)
Chief Complaint: Vaginal Bleeding, Abdominal Pain, and Possible Pregnancy   Event Date/Time   First Provider Initiated Contact with Patient 08/17/21 1746      SUBJECTIVE HPI: Jacqueline Irwin is a 30 y.o. [id number] at [redacted]w[redacted]d by LMP who presents to maternity admissions reporting onset of light vaginal bleeding and cramping this morning, 08/17/21.    Location: low abdomen Quality: cramping Severity: 5/10 on pain scale Duration: 1 day Timing: intermittent Modifying factors: none Associated signs and symptoms: vaginal bleeding  HPI  Past Medical History:  Diagnosis Date   History of IUFD 03/28/2020   History of stillbirth 08/25/2018   @ 8 months, due to preeclampsia   Hx of preeclampsia, prior pregnancy, currently pregnant    first pregnancy   Hx of preeclampsia, prior pregnancy, currently pregnant 02/06/2017   Pregnancy induced hypertension    Supervision of high risk pregnancy, antepartum, unspecified trimester 10/20/2019    Nursing Staff Provider Office Location  [facility] Dating  LMP Language  Arabic Anatomy US   wnl Flu Vaccine  10/20/19 Genetic Screen  NIPS: low risk  AFP:  negative  TDaP vaccine   01/13/2020 Hgb A1C or  GTT Early: 5.2 Third trimester: normal  Rhogam     LAB RESULTS  Feeding Plan Both Blood Type B/Positive/-- (12/15 0951) B pos Contraception Paraguard IUd at post partum visit Antibody Negative (12/15   Past Surgical History:  Procedure Laterality Date   abcess of vulva     APPENDECTOMY     Social History   Socioeconomic History   Marital status: Married    Spouse name: Not on file   Number of children: Not on file   Years of education: Not on file   Highest education level: Not on file  Occupational History   Not on file  Tobacco Use   Smoking status: Never   Smokeless tobacco: Never  Vaping Use   Vaping Use: Never used  Substance and Sexual Activity   Alcohol use: No   Drug use: No   Sexual activity: Yes  Other Topics Concern   Not on file   Social History Narrative   Not on file   Social Determinants of Health   Financial Resource Strain: Not on file  Food Insecurity: Not on file  Transportation Needs: Not on file  Physical Activity: Not on file  Stress: Not on file  Social Connections: Not on file  Intimate Partner Violence: Not on file   No current facility-administered medications on file prior to encounter.   Current Outpatient Medications on File Prior to Encounter  Medication Sig Dispense Refill   Blood Pressure Monitoring (BLOOD PRESSURE CUFF) MISC 1 Device by Does not apply route once a week. (Patient not taking: Reported on 04/27/2020) 1 each 0   Drospirenone (SLYND) 4 MG TABS Take 1 tablet by mouth daily. 28 tablet 11   omeprazole (PRILOSEC) 20 MG capsule Take 1 capsule (20 mg total) by mouth 2 (two) times daily before a meal. 60 capsule 3   terconazole (TERAZOL 7) 0.4 % vaginal cream Place 1 applicator vaginally at bedtime. (Patient not taking: Reported on 06/05/2021) 45 g 0   No Known Allergies  ROS:  Review of Systems  Constitutional:  Negative for chills, fatigue and fever.  Respiratory:  Negative for shortness of breath.   Cardiovascular:  Negative for chest pain.  Gastrointestinal:  Positive for abdominal pain. Negative for nausea and vomiting.  Genitourinary:  Positive for vaginal bleeding. Negative for difficulty urinating, dysuria,  flank pain, pelvic pain, vaginal discharge and vaginal pain.  Neurological:  Negative for dizziness and headaches.  Psychiatric/Behavioral: Negative.      I have reviewed patient's Past Medical Hx, Surgical Hx, Family Hx, Social Hx, medications and allergies.   Physical Exam  Patient Vitals for the past 24 hrs:  BP Temp Temp src Pulse Resp SpO2 Height Weight  08/17/21 1721 120/68 -- -- (!) 101 -- -- -- --  08/17/21 1702 119/72 98.3 F (36.8 C) Oral (!) 104 18 100 % 5\' 6"  (1.676 m) 91.4 kg   Constitutional: Well-developed, well-nourished female in no acute  distress.  Cardiovascular: normal rate Respiratory: normal effort GI: Abd soft, non-tender. Pos BS x 4 MS: Extremities nontender, no edema, normal ROM Neurologic: Alert and oriented x 4.  GU: Neg CVAT.  PELVIC EXAM: vaginal cultures by blind swab. No blood on swabs after collection.   LAB RESULTS Results for orders placed or performed during the hospital encounter of 08/17/21 (from the past 24 hour(s))  Pregnancy, urine POC     Status: Abnormal   Collection Time: 08/17/21  5:08 PM  Result Value Ref Range   Preg Test, Ur POSITIVE (A) NEGATIVE  Urinalysis, Routine w reflex microscopic Urine, Voided     Status: Abnormal   Collection Time: 08/17/21  5:11 PM  Result Value Ref Range   Color, Urine STRAW (A) YELLOW   APPearance CLEAR CLEAR   Specific Gravity, Urine 1.009 1.005 - 1.030   pH 5.0 5.0 - 8.0   Glucose, UA NEGATIVE NEGATIVE mg/dL   Hgb urine dipstick NEGATIVE NEGATIVE   Bilirubin Urine NEGATIVE NEGATIVE   Ketones, ur NEGATIVE NEGATIVE mg/dL   Protein, ur NEGATIVE NEGATIVE mg/dL   Nitrite NEGATIVE NEGATIVE   Leukocytes,Ua NEGATIVE NEGATIVE  CBC     Status: Abnormal   Collection Time: 08/17/21  5:36 PM  Result Value Ref Range   WBC 11.2 (H) 4.0 - 10.5 K/uL   RBC 4.07 3.87 - 5.11 MIL/uL   Hemoglobin 10.3 (L) 12.0 - 15.0 g/dL   HCT 08/19/21 (L) 94.8 - 54.6 %   MCV 78.4 (L) 80.0 - 100.0 fL   MCH 25.3 (L) 26.0 - 34.0 pg   MCHC 32.3 30.0 - 36.0 g/dL   RDW 27.0 35.0 - 09.3 %   Platelets 280 150 - 400 K/uL   nRBC 0.0 0.0 - 0.2 %  Wet prep, genital     Status: Abnormal   Collection Time: 08/17/21  5:40 PM   Specimen: PATH Cytology Cervicovaginal Ancillary Only  Result Value Ref Range   Yeast Wet Prep HPF POC NONE SEEN NONE SEEN   Trich, Wet Prep NONE SEEN NONE SEEN   Clue Cells Wet Prep HPF POC NONE SEEN NONE SEEN   WBC, Wet Prep HPF POC FEW (A) NONE SEEN   Sperm NONE SEEN        IMAGING 08/19/21 OB LESS THAN 14 WEEKS WITH OB TRANSVAGINAL  Result Date: 08/17/2021 CLINICAL  DATA:  30 year old pregnant female presents with vaginal bleeding and cramping. Quantitative beta HCG pending. EDC by LMP: 04/02/2022, projecting to an expected gestational age of [redacted] weeks 3 days EXAM: OBSTETRIC <14 WK 04/04/2022 AND TRANSVAGINAL OB US TECHNIQUE: Both transabdominal and transvaginal ultrasound examinations were performed for complete evaluation of the gestation as well as the maternal uterus, adnexal regions, and pelvic cul-de-sac. Transvaginal technique was performed to assess early pregnancy. COMPARISON:  No prior scans from this gestation. FINDINGS: Intrauterine gestational sac: Single Yolk sac:  Visualized. Embryo:  Visualized. Cardiac Activity: Visualized. Heart Rate: 166 bpm CRL:  8.6 mm   6 w   5 d                  [place] EDC: 04/07/2022 Subchorionic hemorrhage:  None visualized. Maternal uterus/adnexae: Left ovary measures 3.0 x 1.7 x 2.2 cm. Right ovary measures 2.5 x 2.0 x 3.3 cm and contains a corpus luteum. No suspicious ovarian or adnexal masses. No abnormal free fluid in the pelvis. Anteverted uterus with no uterine fibroids demonstrated. IMPRESSION: Single living intrauterine gestation at 6 weeks 5 days by crown-rump length, without significant discordance with provided dating. No acute early first-trimester gestational abnormality. Electronically Signed   By: Delbert Phenix M.D.   On: 08/17/2021 18:45    MAU Management/MDM: Orders Placed This Encounter  Procedures   Wet prep, genital   US OB LESS THAN 14 WEEKS WITH OB TRANSVAGINAL   Urinalysis, Routine w reflex microscopic Urine, Clean Catch   CBC   hCG, quantitative, pregnancy   Pregnancy, urine POC   Discharge patient    No orders of the defined types were placed in this encounter.   Viable IUP on today's [place] with FHR 166,  no evidence of subchorionic hemorrhage or other cause for bleeding.  Bleeding precautions/reasons to return to MAU reviewed.  Start prenatal care.  Message sent to [facility], pt past provider, to scheduled new  OB visit.    ASSESSMENT 1. [redacted] weeks gestation of pregnancy   2. Vaginal bleeding in pregnancy, first trimester     PLAN Discharge home Allergies as of 08/17/2021   No Known Allergies      Medication List     STOP taking these medications    Slynd 4 MG Tabs Generic drug: Drospirenone   terconazole 0.4 % vaginal cream Commonly known as: TERAZOL 7       TAKE these medications    Blood Pressure Cuff Misc 1 Device by Does not apply route once a week.   omeprazole 20 MG capsule Commonly known as: PriLOSEC Take 1 capsule (20 mg total) by mouth 2 (two) times daily before a meal.        Follow-up Information     Provider of your choice Follow up.   Why: See list provided                Sharen Counter Certified Nurse-Midwife 08/17/2021  6:55 PM

## 2021-08-17 NOTE — Discharge Instructions (Signed)
Prenatal Care Providers           [facility]  [address] [phone]  [facility]   [address]  [phone]  [facility] [place]       [address] [phone]            [facility] @ [place]     [address] [phone]          [facility] [place]   [address] [phone]  [facility]  [address] [phone]     [facility] ()  [address]   [phone]     [facility]  Phone: [phone]  [facility]  Phone: [phone]  [facility] Phone: [phone]  [facility] Phone: [phone]  [facility] Phone: [phone]  [facility] Phone: [phone]  [facility]  Phone: [phone]  

## 2021-08-17 NOTE — MAU Note (Signed)
Had cramping pain and blood coming out, more than one time.  The pain comes and goes, no more bleeding since this morning.   +HPT, appt at [facility] 10/26 for confirmation.

## 2021-08-18 ENCOUNTER — Telehealth: Payer: Self-pay | Admitting: Obstetrics

## 2021-08-18 LAB — GC/CHLAMYDIA PROBE AMP (~~LOC~~) NOT AT ARMC
Chlamydia: NEGATIVE
Comment: NEGATIVE
Comment: NORMAL
Neisseria Gonorrhea: NEGATIVE

## 2021-08-20 IMAGING — US US MFM OB DETAIL+14 WK
1 series · 12 of 28 positions shown · non-contrast
Comparison: none

[Series 1: us mfm ob detail+14 wk · 12 of 68 slices shown]
[im 3/68]
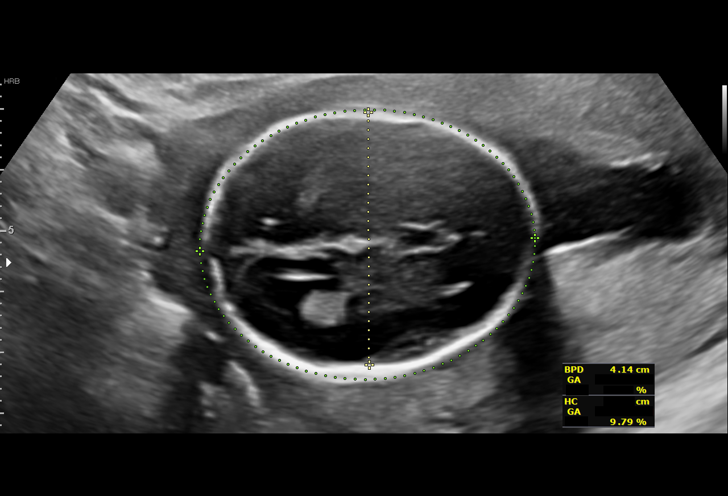
[im 8/68]
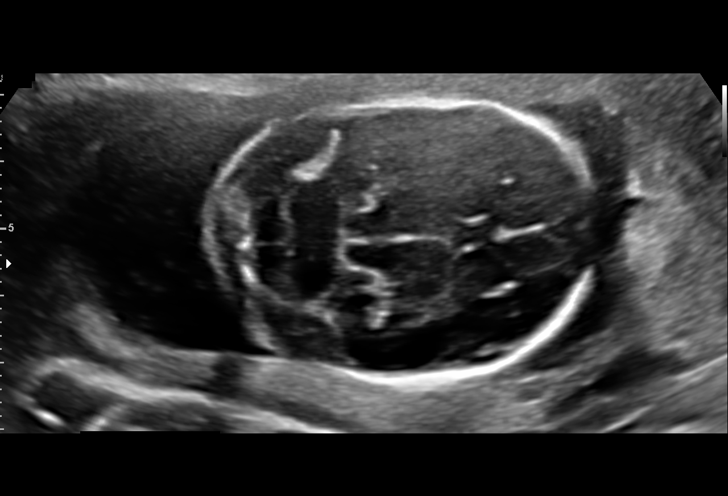
[im 13/68]
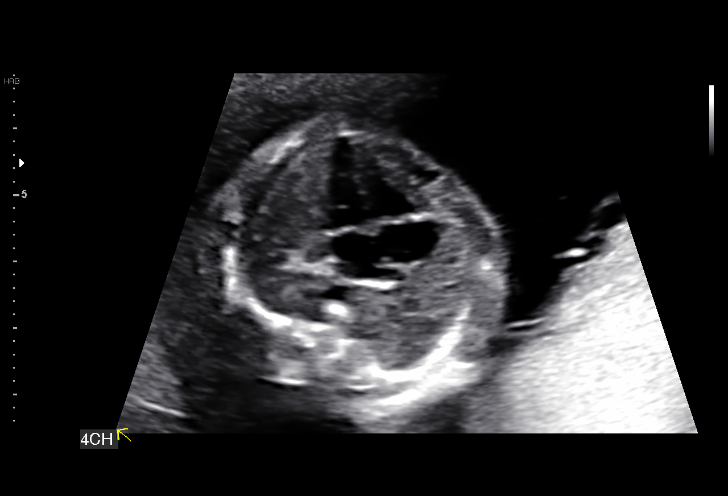
[im 20/68]
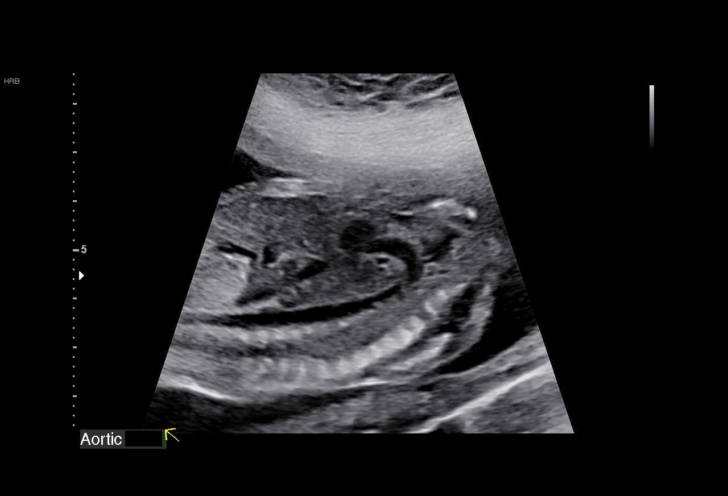
[im 25/68]
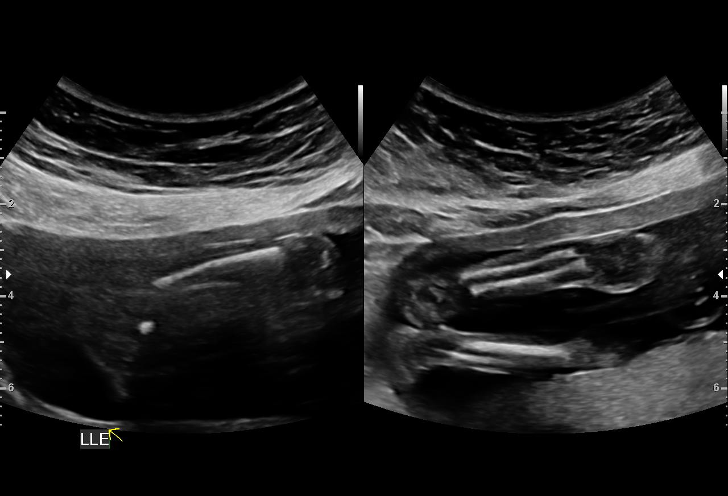
[im 30/68]
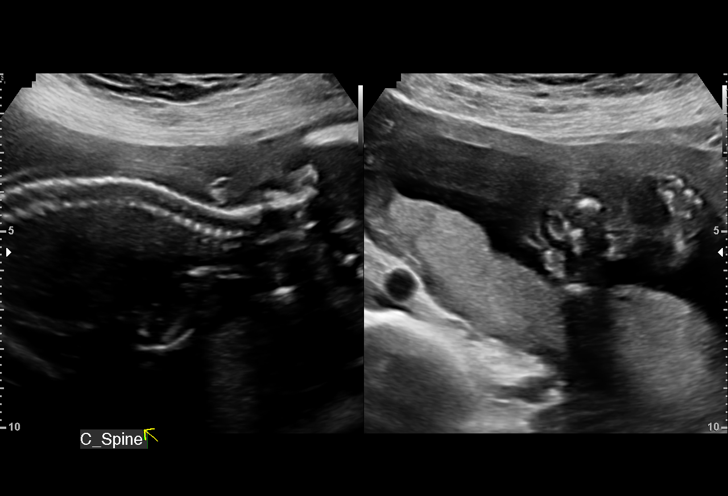
[im 38/68]
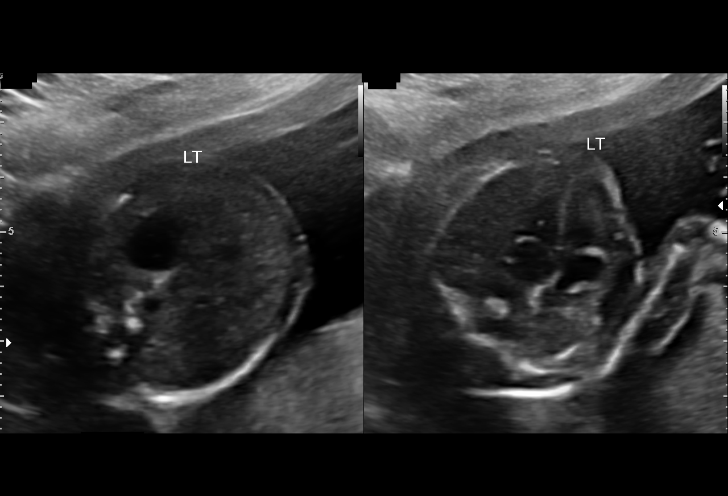
[im 43/68]
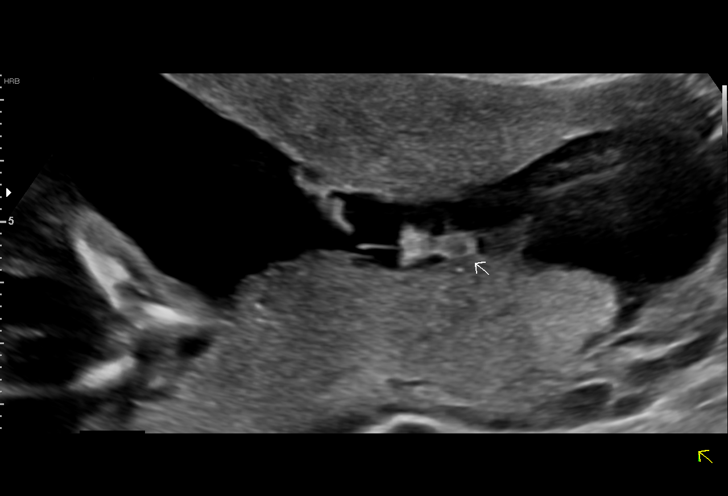
[im 48/68]
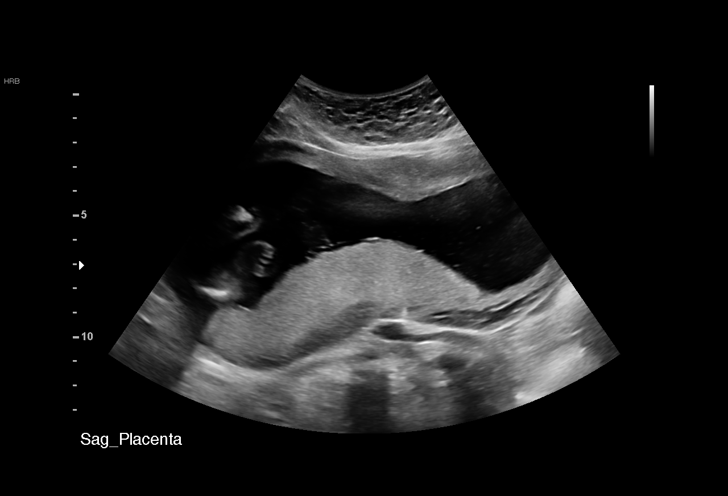
[im 55/68]
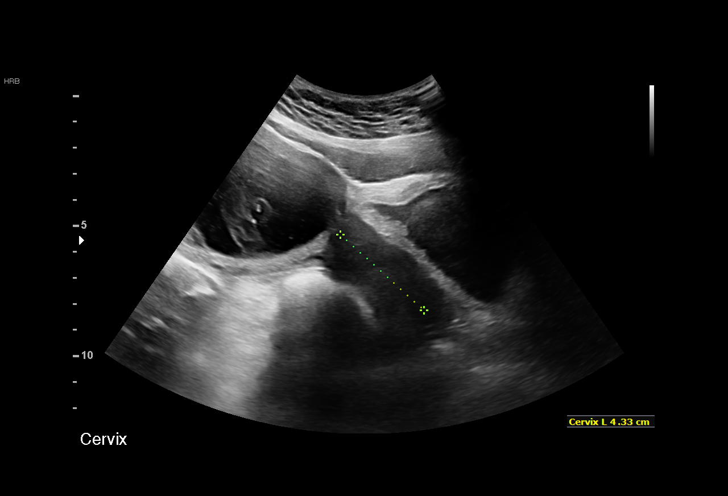
[im 60/68]
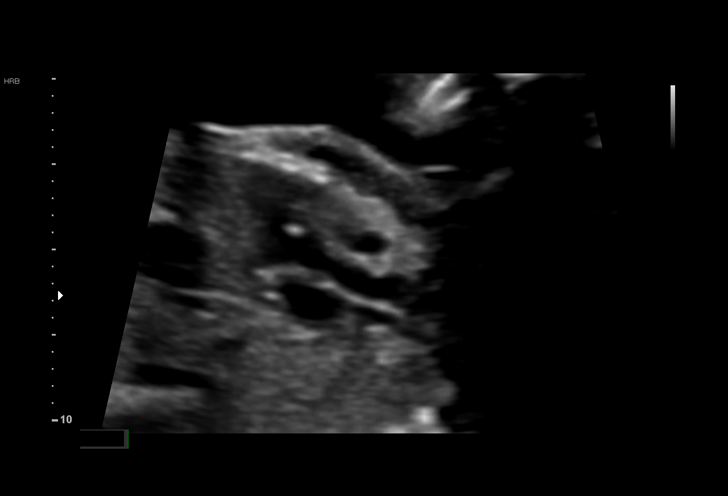
[im 65/68]
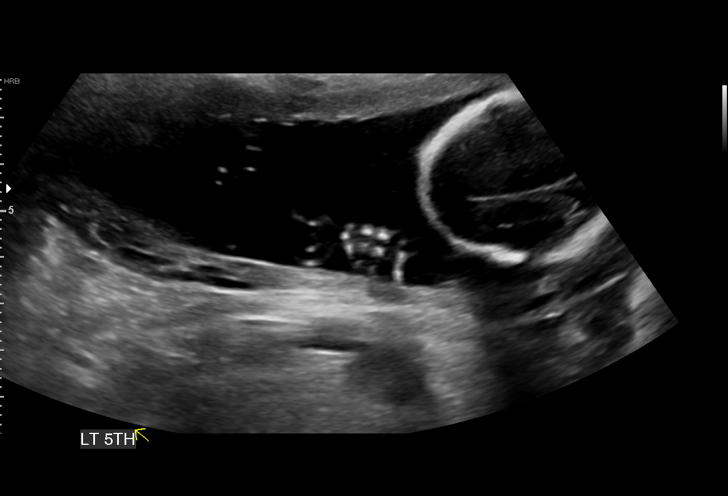

[12 of 28 positions shown; findings below may reference images not displayed]

----------------------------------------------------------------------

 ----------------------------------------------------------------------
Indications

  Encounter for antenatal screening for
  malformations (low risk NIPS, neg carrier
  screen)
  Poor obstetric history: Previous IUFD
  (stillbirth)
  19 weeks gestation of pregnancy
  Poor obstetric history: Previous preterm
  delivery, antepartum 36w
  Poor obstetric history: Previous
  preeclampsia (ASA)
  Obesity complicating pregnancy, second
  trimester (BMI 31)
 ----------------------------------------------------------------------
Vital Signs

 BMI:
Fetal Evaluation

 Num Of Fetuses:         1
 Fetal Heart Rate(bpm):  150
 Cardiac Activity:       Observed
 Presentation:           Cephalic
 Placenta:               Posterior
 P. Cord Insertion:      Visualized, central

 Amniotic Fluid
 AFI FV:      Within normal limits

                             Largest Pocket(cm)

Biometry

 BPD:      41.8  mm     G. Age:  18w 5d         19  %    CI:         72.3   %    70 - 86
                                                         FL/HC:      19.2   %    16.1 -
 HC:      156.4  mm     G. Age:  18w 4d          9  %    HC/AC:      1.17        1.09 -
 AC:       134   mm     G. Age:  18w 6d         27  %    FL/BPD:     72.0   %
 FL:       30.1  mm     G. Age:  19w 2d         38  %    FL/AC:      22.5   %    20 - 24
 CER:      20.7  mm     G. Age:  19w 5d         55  %
 NFT:       4.4  mm
 LV:        7.1  mm
 CM:        4.1  mm

 Est. FW:     269  gm      0 lb 9 oz     23  %
OB History

 Gravidity:    5         Term:   2        Prem:   1        SAB:   1
 Living:       2
Gestational Age

 LMP:           19w 3d        Date:  07/06/19                 EDD:   04/11/20
 U/S Today:     18w 6d                                        EDD:   04/15/20
 Best:          19w 3d     Det. By:  LMP  (07/06/19)          EDD:   04/11/20
Anatomy

 Cranium:               Appears normal         Aortic Arch:            Not well visualized
 Cavum:                 Appears normal         Ductal Arch:            Not well visualized
 Ventricles:            Appears normal         Diaphragm:              Appears normal
 Choroid Plexus:        Appears normal         Stomach:                Appears normal, left
                                                                       sided
 Cerebellum:            Appears normal         Abdomen:                Appears normal
 Posterior Fossa:       Appears normal         Abdominal Wall:         Appears nml (cord
                                                                       insert, abd wall)
 Nuchal Fold:           Appears normal         Cord Vessels:           Appears normal (3
                                                                       vessel cord)
 Face:                  Appears normal         Kidneys:                Appear normal
                        (orbits and profile)
 Lips:                  Appears normal         Bladder:                Appears normal
 Thoracic:              Appears normal         Spine:                  Appears normal
 Heart:                 Echogenic focus        Upper Extremities:      Appears normal
                        in LV
 RVOT:                  Not well visualized    Lower Extremities:      Appears normal
 LVOT:                  Appears normal

 Other:  Female gender Heels and 5th digit visualized. Technically difficult due
         to maternal habitus and fetal position.
Cervix Uterus Adnexa

 Cervix
 Length:           4.33  cm.
 Normal appearance by transabdominal scan.

 Uterus
 No abnormality visualized.
 Left Ovary
 Within normal limits.

 Right Ovary
 Within normal limits.

 Cul De Sac
 No free fluid seen.

 Adnexa
 No abnormality visualized.
Impression

 G5 P3.  Patient is here for fetal anatomy scan.  I counseled
 her with the help of Arabic language interpreter.
 Obstetric history is significant for stillbirth.  Her first
 pregnancy was complicated by preeclampsia that ended in
 stillbirth and she delivered at 36 weeks gestation.
 Subsequently, she had 2 term vaginal deliveries and both her
 children are in good health.
 She eports no chronic medical conditions.
 On cell free fetal DNA screening the risks of fetal
 aneuploidies are not increased.

 We performed fetal anatomy scan. An echogenic intracardiac
 focus is seen. No other makers of aneuploidies or fetal
 structural defects are seen. Fetal biometry is consistent with
 her previously-established dates. Amniotic fluid is normal and
 good fetal activity is seen.
 I informed the patient that given that she had Jhemboy Padam for fetal
 aneuploidies on cell-free fetal DNA screening, finding of
 echogenic intracardiac focus should be considered a normal
 variant and that the risk of trisomy 21 is not increased. I also
 reassured that echogenic focus does not increase the risk of
 cardiac defects. I also informed her that only amniocentesis
 will give a defintive result on the fetal karyotype.
 Patient opted not to have amniocentesis.
Recommendations

 -An appointment was made for her to return in 4 weeks for
 completion of fetal anatomy.
                 Zeigler, Evangelina

## 2021-09-13 ENCOUNTER — Other Ambulatory Visit: Payer: Self-pay

## 2021-09-13 ENCOUNTER — Ambulatory Visit (INDEPENDENT_AMBULATORY_CARE_PROVIDER_SITE_OTHER): Payer: Medicaid Other

## 2021-09-13 DIAGNOSIS — O0990 Supervision of high risk pregnancy, unspecified, unspecified trimester: Secondary | ICD-10-CM | POA: Insufficient documentation

## 2021-09-13 DIAGNOSIS — R12 Heartburn: Secondary | ICD-10-CM

## 2021-09-13 MED ORDER — GOJJI WEIGHT SCALE MISC
1.0000 [IU] | 0 refills | Status: DC
Start: 2021-09-13 — End: 2022-03-14

## 2021-09-13 MED ORDER — OMEPRAZOLE 20 MG PO CPDR: 20.0000 mg | DELAYED_RELEASE_CAPSULE | Freq: Two times a day (BID) | ORAL | 3 refills | Status: DC

## 2021-09-13 MED ORDER — PREPLUS 27-1 MG PO TABS: 1.0000 | ORAL_TABLET | Freq: Every day | ORAL | 13 refills | Status: DC

## 2021-09-13 NOTE — Progress Notes (Addendum)
New OB Intake  I connected with  Jacqueline Irwin on 09/13/21 at  2:00 PM EST in person  verified that I am speaking with the correct person using two identifiers. Nurse is located at [facility] and pt is located at [facility].  I explained I am completing New OB Intake today. We discussed her EDD of 04/02/22 that is based on LMP of 06/26/21. Pt is G6/P3. I reviewed her allergies, medications, Medical/Surgical/OB history, and appropriate screenings. I informed her of [facility] services. Based on history, this is a/an  pregnancy complicated by language barrier  and history of fetal demise at 36w due to [facility].    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   MyChart/Babyscripts MyChart access verified. I explained pt will have some visits in office and some virtually. Babyscripts instructions given and order placed. Patient verifies receipt of registration text/e-mail. Account successfully created and app downloaded.  Blood Pressure Cuff  Pt has blood pressure cuff from previous pregnancy at home. Explained after first prenatal appt pt will check weekly and document in Babyscripts.  Weight scale: Patient does not have weight scale. Weight scale ordered for patient to pick up form [facility].   Anatomy US Explained first scheduled [place] will be around 19 weeks. Anatomy US to be scheduled at NOB visit on 09/20/21.   Labs Discussed Avelina Laine genetic screening with patient. Patient would like to proceed with Panorama. Will not need Horizon screening. Can be drawn with routine OB labs.  Covid Vaccine Patient has covid vaccine. Will bring  card to NOB visit to verify dates.   Mother/ Baby Dyad Candidate?    If yes, offer as possibility  Informed patient of [facility] website  and placed link in her AVS.   Social Determinants of Health Food Insecurity: Patient denies food insecurity. WIC Referral: Patient is not interested in referral to [facility].  Transportation: Patient denies transportation needs. Childcare: Discussed no children allowed at ultrasound appointments. Offered childcare services; patient declines childcare services at this time.  Send link to Pregnancy Navigators   Placed OB Box on problem list and updated  First visit review I reviewed new OB appt with pt. I explained she will have a pelvic exam, ob bloodwork with genetic screening, and PAP smear. Explained pt will be seen by Dr. Macon Large at first visit; encounter routed to appropriate provider. Explained that patient will be seen by pregnancy navigator following visit with provider. [facility] information placed in AVS.   Oneita Hurt, CMA 09/13/2021  2:02 PM

## 2021-09-17 IMAGING — US US MFM OB FOLLOW-UP
1 series · 13 of 28 positions shown · non-contrast
Comparison: none

[Series 1: us mfm ob follow-up · 68 acquisitions, 13 frames shown]
[im 3/68]
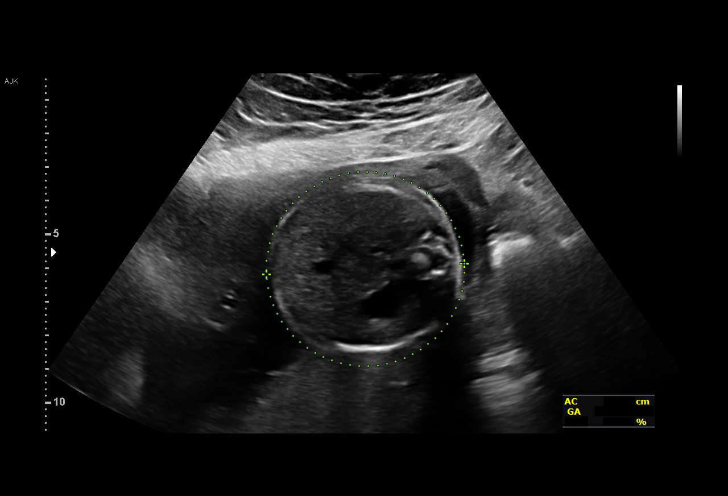
[im 8/68]
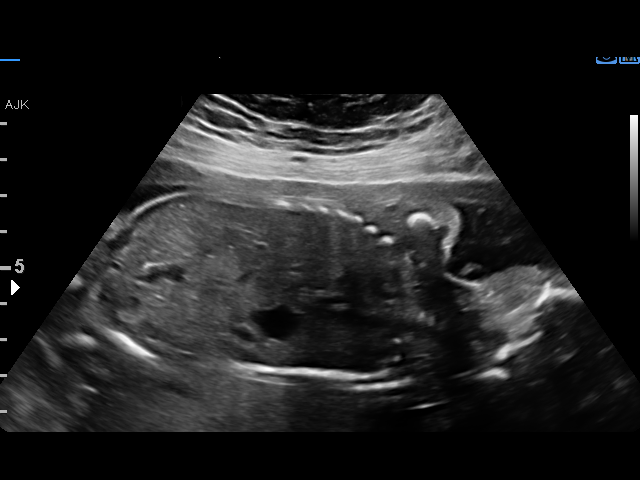
[im 13/68]
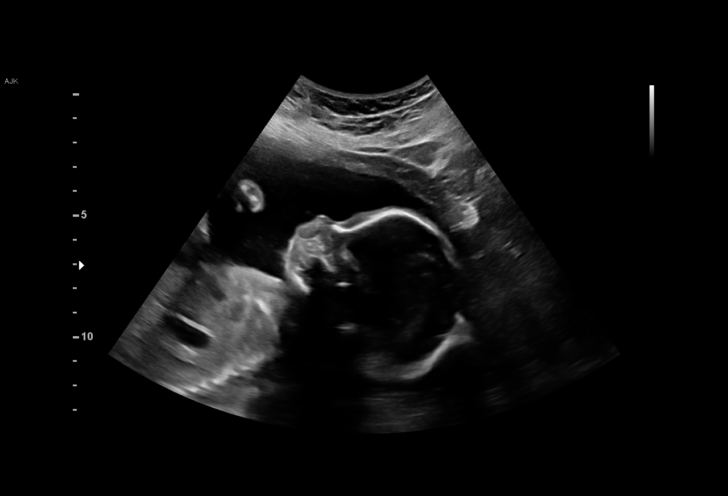
[im 18/68]
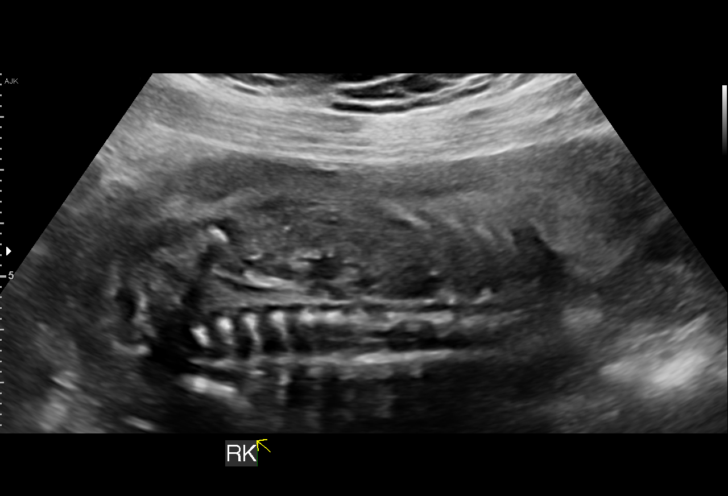
[im 23/68]
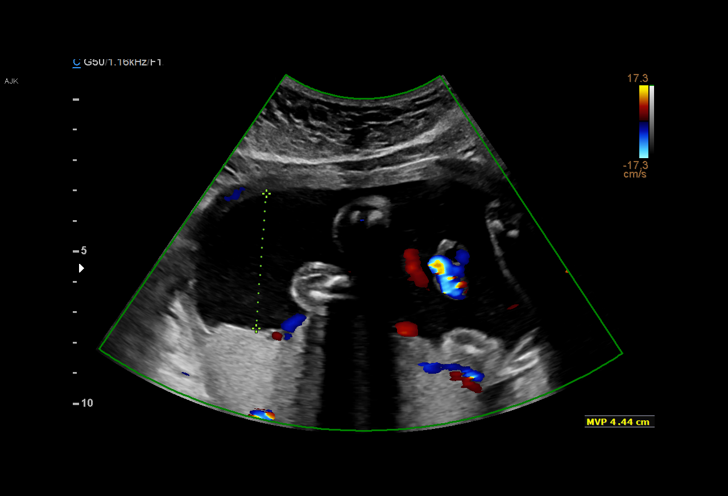
[im 28/68]
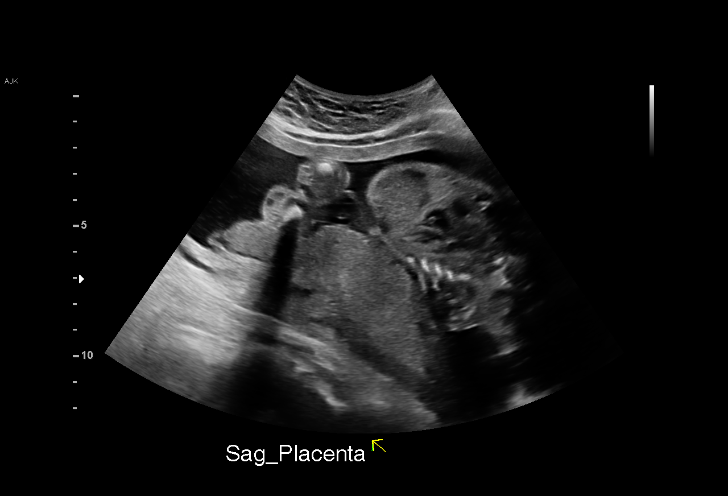
[im 35/68]
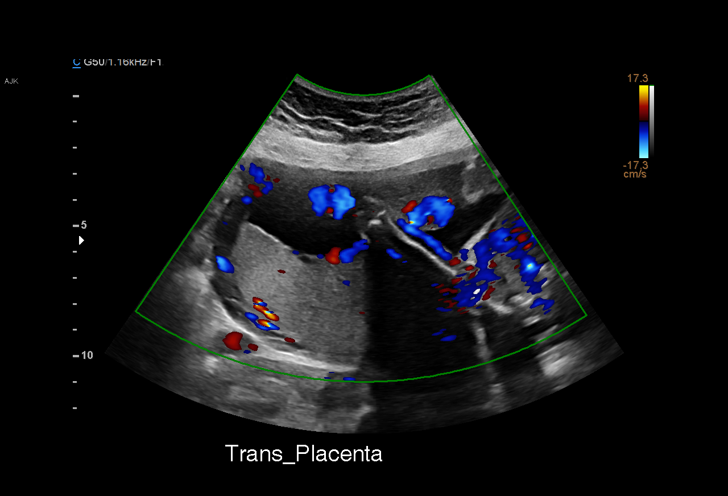
[im 40/68]
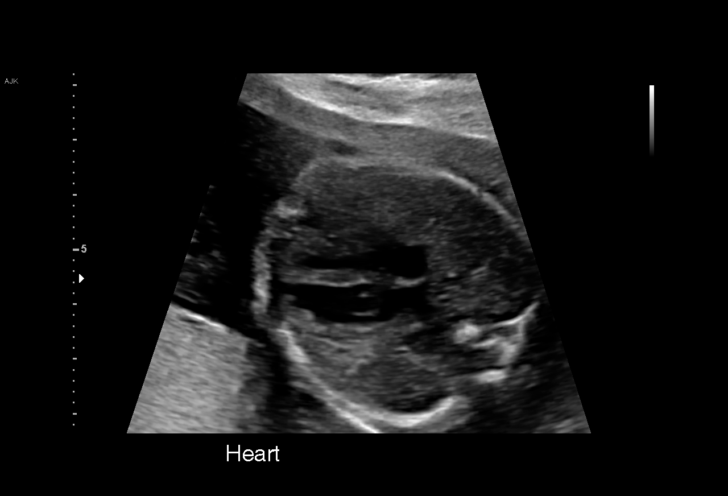
[im 45/68]
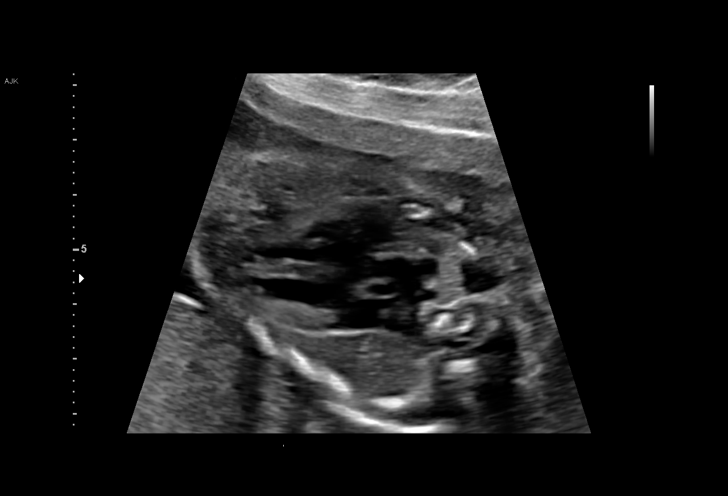
[im 50/68]
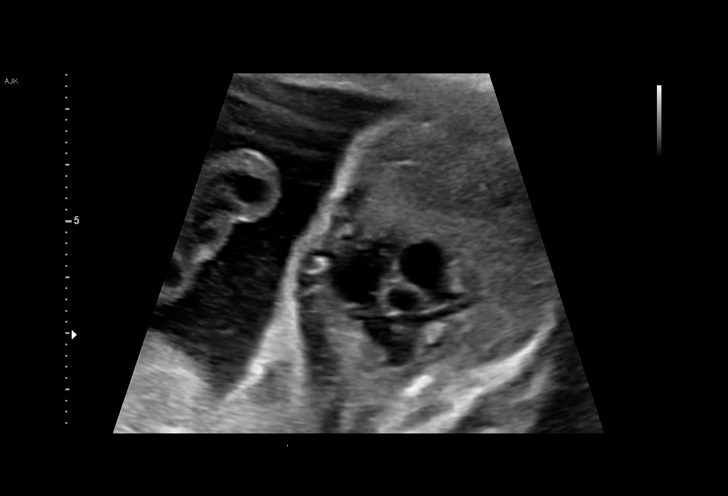
[im 55/68]
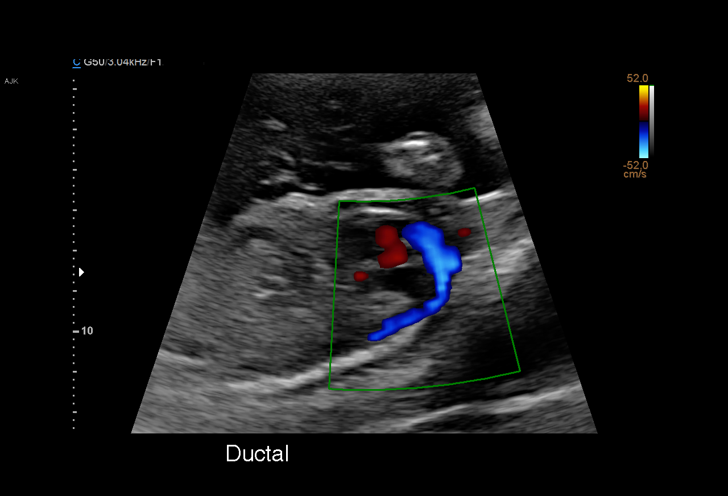
[im 60/68]
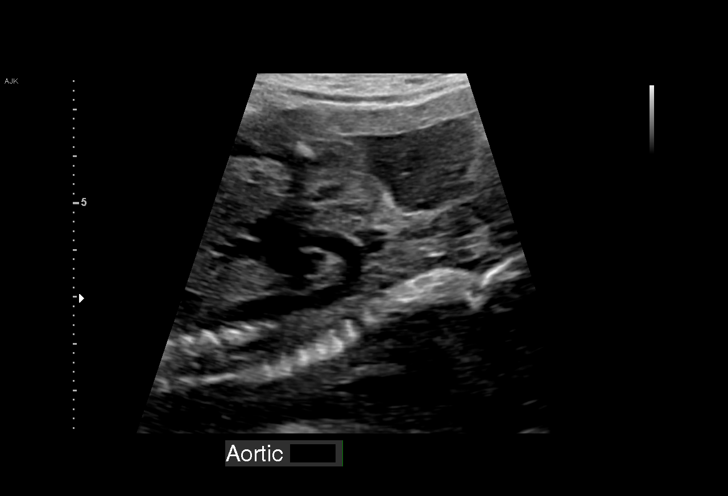
[im 65/68]
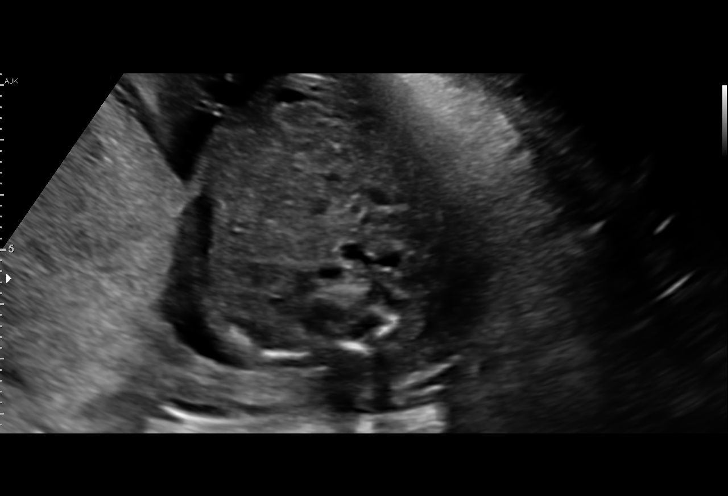

[13 of 28 positions shown; findings below may reference images not displayed]

----------------------------------------------------------------------

 ----------------------------------------------------------------------
Indications

  23 weeks gestation of pregnancy
  Poor obstetric history: Previous IUFD
  (stillbirth)
  Poor obstetric history: Previous preterm
  delivery, antepartum 36w
  Poor obstetric history: Previous
  preeclampsia (ASA)
  Obesity complicating pregnancy, second
  trimester (BMI 31)
  Encounter for other antenatal screening
  follow-up
 ----------------------------------------------------------------------
Vital Signs

                                                Height:        5'4"
Fetal Evaluation

 Num Of Fetuses:         1
 Fetal Heart Rate(bpm):  143
 Cardiac Activity:       Observed
 Presentation:           Cephalic
 Placenta:               Posterior

 Amniotic Fluid
 AFI FV:      Within normal limits

                             Largest Pocket(cm)

Biometry
 BPD:      55.6  mm     G. Age:  23w 0d         27  %    CI:        74.16   %    70 - 86
                                                         FL/HC:      19.7   %    19.2 -
 HC:       205   mm     G. Age:  22w 4d         10  %    HC/AC:      1.10        1.05 -
 AC:      185.6  mm     G. Age:  23w 2d         39  %    FL/BPD:     72.7   %    71 - 87
 FL:       40.4  mm     G. Age:  23w 0d         27  %    FL/AC:      21.8   %    20 - 24
 HUM:      37.2  mm     G. Age:  23w 0d         34  %

 Est. FW:     568  gm      1 lb 4 oz     29  %
OB History

 Gravidity:    5         Term:   2        Prem:   1        SAB:   1
 Living:       2
Gestational Age

 LMP:           23w 3d        Date:  07/06/19                 EDD:   04/11/20
 U/S Today:     23w 0d                                        EDD:   04/14/20
 Best:          23w 3d     Det. By:  LMP  (07/06/19)          EDD:   04/11/20
Anatomy

 Cranium:               Appears normal         Aortic Arch:            Appears normal
 Cavum:                 Appears normal         Ductal Arch:            Appears normal
 Ventricles:            Appears normal         Diaphragm:              Appears normal
 Choroid Plexus:        Previously seen        Stomach:                Appears normal, left
                                                                       sided
 Cerebellum:            Previously seen        Abdomen:                Appears normal
 Posterior Fossa:       Previously seen        Abdominal Wall:         Previously seen
 Nuchal Fold:           Previously seen        Cord Vessels:           Previously seen
 Face:                  Orbits and profile     Kidneys:                Appear normal
                        previously seen
 Lips:                  Previously seen        Bladder:                Appears normal
 Thoracic:              Appears normal         Spine:                  Previously seen
 Heart:                 Echogenic focus        Upper Extremities:      Previously seen
                        in LV prev seen
 RVOT:                  Appears normal         Lower Extremities:      Previously seen
 LVOT:                  Appears normal

 Other:  Female gender Heels and 5th digit prev visualized. Technically
         difficult due to maternal habitus and fetal position.
Cervix Uterus Adnexa

 Cervix
 Length:           4.74  cm.
 Normal appearance by transabdominal scan.

 Uterus
 No abnormality visualized.

 Left Ovary
 Not visualized.

 Right Ovary
 Not visualized.

 Adnexa
 No abnormality visualized.
Impression

 Patient returned for completion of fetal anatomy. Amniotic
 fluid is normal and good fetal activity is seen. Fetal growth is
 appropriate for gestational age. Fetal anatomical survey was
 completed and appears normal.
 We reassured the patient of the findings.

 History of stillbirth.
Recommendations

 -An appointment was made for her to return in 5 weeks for
 fetal growth assessment.
                 Kat, Fb

## 2021-09-20 ENCOUNTER — Other Ambulatory Visit: Payer: Self-pay

## 2021-09-20 ENCOUNTER — Other Ambulatory Visit (HOSPITAL_COMMUNITY)
Admission: RE | Admit: 2021-09-20 | Discharge: 2021-09-20 | Disposition: A | Discharge status: Home / Self Care | Payer: Medicaid Other | Source: Ambulatory Visit | Attending: Obstetrics & Gynecology | Admitting: Obstetrics & Gynecology

## 2021-09-20 ENCOUNTER — Encounter: Payer: Self-pay | Admitting: Obstetrics & Gynecology

## 2021-09-20 ENCOUNTER — Ambulatory Visit (INDEPENDENT_AMBULATORY_CARE_PROVIDER_SITE_OTHER): Payer: Medicaid Other | Admitting: Obstetrics & Gynecology

## 2021-09-20 VITALS — BP 116/76 | HR 94 | Wt 200.0 lb

## 2021-09-20 DIAGNOSIS — O99210 Obesity complicating pregnancy, unspecified trimester: Secondary | ICD-10-CM | POA: Diagnosis not present

## 2021-09-20 DIAGNOSIS — Z23 Encounter for immunization: Secondary | ICD-10-CM

## 2021-09-20 DIAGNOSIS — O0990 Supervision of high risk pregnancy, unspecified, unspecified trimester: Secondary | ICD-10-CM | POA: Diagnosis not present

## 2021-09-20 DIAGNOSIS — Z3A12 12 weeks gestation of pregnancy: Secondary | ICD-10-CM

## 2021-09-20 DIAGNOSIS — R7303 Prediabetes: Secondary | ICD-10-CM

## 2021-09-20 DIAGNOSIS — Z3480 Encounter for supervision of other normal pregnancy, unspecified trimester: Secondary | ICD-10-CM

## 2021-09-20 DIAGNOSIS — O09299 Supervision of pregnancy with other poor reproductive or obstetric history, unspecified trimester: Secondary | ICD-10-CM | POA: Diagnosis not present

## 2021-09-20 DIAGNOSIS — R7989 Other specified abnormal findings of blood chemistry: Secondary | ICD-10-CM

## 2021-09-20 MED ORDER — ASPIRIN EC 81 MG PO TBEC: 81.0000 mg | DELAYED_RELEASE_TABLET | Freq: Every day | ORAL | 2 refills | Status: DC

## 2021-09-20 NOTE — Progress Notes (Signed)
History:   Jacqueline Irwin Jacqueline Irwin is a 30 y.o. [phone] at [redacted]w[redacted]d by LMP, 6 week ultrasound (difference is exactly five days) being seen today for her first obstetrical visit.  Patient is Arabic-speaking only, interpreter present for this encounter. 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  Recently took out Paragard IUD in 06/2021 due to bleeding problems, started OCPs but conceived on them. Reports this is second time she is conceiving on OCPs.  Interested in progestin IUD postpartum.   Patient does intend to breast feed. Pregnancy history fully reviewed.  Patient reports no complaints.     HISTORY: OB History  Gravida Para Term Preterm AB Living  6 4 3 1 1 3   SAB IAB Ectopic Multiple Live Births  1 0 0 0 3    # Outcome Date GA Lbr Len/2nd Weight Sex Delivery Anes PTL Lv  6 Current           5 Term 03/28/20 [redacted]w[redacted]d 04:20 / 00:14 5 lb 15.8 oz (2.716 kg) F Vag-Spont EPI  LIV     Name: Jacqueline Irwin     Apgar1: 8  Apgar5: 9  4 Term 10/10/18 [redacted]w[redacted]d 06:07 / 00:16 6 lb 1.2 oz (2.756 kg) F Vag-Spont EPI  LIV     Name: Jacqueline Irwin     Apgar1: 8  Apgar5: 9  3 Term 07/20/17 [redacted]w[redacted]d / 02:45 6 lb 1.7 oz (2.77 kg) M Vag-Spont EPI  LIV     Name: Jacqueline Irwin     Apgar1: 9  Apgar5: 9  2 Preterm 06/07/16 [redacted]w[redacted]d       FD     Complications: PIH (pregnancy induced hypertension)  1 SAB           Last pap smear was done 04/27/2020 and was normal and negative  HRHPV.  Past Medical History:  Diagnosis Date   History of stillbirth 08/25/2018   @ 8 months, due to preeclampsia   Lichen sclerosus 02/06/2017   Preeclampsia    first pregnancy   Past Surgical History:  Procedure Laterality Date   abcess of vulva     APPENDECTOMY     Family History  Problem Relation Age of Onset   Diabetes Mother    Diabetes Paternal Grandmother    Hypertension Paternal Grandmother    Social History   Tobacco Use   Smoking status: Never   Smokeless tobacco: Never  Vaping Use   Vaping Use: Never used  Substance Use Topics   Alcohol use: No   Drug use: No   No Known Allergies Current Outpatient Medications on File Prior to Visit  Medication Sig Dispense Refill   Blood Pressure Monitoring (BLOOD PRESSURE CUFF) MISC 1 Device by Does not apply route once a week. 1 each 0   Misc. Devices (GOJJI WEIGHT SCALE) MISC 1 Units by Does not apply route as directed. 1 each 0   omeprazole (PRILOSEC) 20 MG capsule Take 1 capsule (20 mg total) by mouth 2 (two) times daily before a meal. 60 capsule 3  Prenatal Vit-Fe Fumarate-FA (PREPLUS) 27-1 MG TABS Take 1 tablet by mouth daily. 30 tablet 13   No current facility-administered medications on file prior to visit.    Review of Systems Pertinent items noted in HPI and remainder of comprehensive ROS otherwise negative.  Physical Exam:   Vitals:   09/20/21 0832  BP: 116/76  Pulse: 94  Weight: 200 lb (90.7 kg)   Fetal Heart Rate (bpm): 160   General: well-developed, well-nourished female in no acute distress  Breasts:  deferred   Skin: normal coloration and turgor, has acne  Neurologic: oriented, normal, negative, normal mood  Extremities: normal strength, tone, and muscle mass, ROM of all joints is normal  HEENT PERRLA, extraocular movement intact and sclera clear, anicteric  Neck supple and no masses  Cardiovascular: regular rate and rhythm  Respiratory:  no respiratory distress, normal breath sounds   Abdomen: soft, non-tender; bowel sounds normal; no masses,  no organomegaly  Pelvic: deferred     Assessment:    Pregnancy: [id number] Patient Active Problem List   Diagnosis Date Noted   History of intrauterine fetal death at 56 weeks, currently pregnant 09/20/2021   Hx of preeclampsia, prior pregnancy, currently pregnant 09/20/2021   Supervision of high risk pregnancy, antepartum 09/13/2021   BMI 34.0-34.9,adult 07/18/2020   Maternal varicella, non-immune 02/07/2017   Non-English speaking patient 02/06/2017   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 History of intrauterine fetal death at 36 weeks, currently pregnant Intrauterine Fetal Demise (>24 weeks) 24-28-32-36 32 39 (37-38 if history of abruption or severe maternal anxiety or as per MFM)  - [place] MFM OB DETAIL +14 WK; Future  2. Hx of preeclampsia, prior pregnancy, currently pregnant Baseline labs ordered. Aspirin prescribed. Has BP cuff, will check BP weekly. - [place] MFM OB DETAIL +14 WK; Future - aspirin EC 81 MG tablet; Take 1 tablet (81 mg total) by mouth daily. Take after 12 weeks for prevention of preeclampsia later in pregnancy  Dispense: 300 tablet; Refill: 2 - Comprehensive metabolic panel - Protein / creatinine ratio, urine  3. Obesity in pregnancy, antepartum TWG recommended 11-20 lbs. Labs ordered. - Comprehensive metabolic panel - TSH - Hemoglobin A1c  4. Flu vaccine need - Flu Vaccine QUAD 36+ mos IM (Fluarix, Quad PF) given today.  5. [redacted] weeks gestation of pregnancy 6. Supervision of high risk pregnancy, antepartum - Culture, OB Urine - Genetic Screening - GC/Chlamydia probe amp ([place])not at [facility] - CBC/D/Plt+RPR+Rh+ABO+RubIgG... - [place] MFM OB DETAIL +14 WK; Future  Initial labs drawn. Continue prenatal vitamins. Problem list reviewed and updated. Genetic Screening discussed, NIPS: ordered. Ultrasound discussed; fetal anatomic survey: ordered. Anticipatory guidance about  prenatal visits given including labs, ultrasounds, and testing. Encouraged to complete MyChart Registration for her ability to review results, send requests, and have questions addressed.  The nature of  - [facility] with multiple MDs and Advanced Practice Providers was explained to patient; also emphasized that residents, students are part of our team. Routine obstetric precautions reviewed. Encouraged to seek out care at office or emergency room [facility] preferred) for urgent and/or emergent concerns. Return in about 4 weeks (around 10/18/2021) for OFFICE OB VISIT (MD only).     Jaynie Collins, MD, FACOG Obstetrician & Gynecologist, [facility] for [employer], [facility]

## 2021-09-20 NOTE — Progress Notes (Signed)
NEW OB, FLU vaccine given in LD, tolerated well.  Reports no problems today.

## 2021-09-20 NOTE — Patient Instructions (Signed)
First Trimester of Pregnancy °The first trimester of pregnancy starts on the first day of your last menstrual period until the end of week 12. This is months 1 through 3 of pregnancy. A week after a sperm fertilizes an egg, the egg will implant into the wall of the uterus and begin to develop into a baby. By the end of 12 weeks, all the baby's organs will be formed and the baby will be 2-3 inches in size. °Body changes during your first trimester °Your body goes through many changes during pregnancy. The changes vary and generally return to normal after your baby is born. °Physical changes °You may gain or lose weight. °Your breasts may begin to grow larger and become tender. The tissue that surrounds your nipples (areola) may become darker. °Dark spots or blotches (chloasma or mask of pregnancy) may develop on your face. °You may have changes in your hair. These can include thickening or thinning of your hair or changes in texture. °Health changes °You may feel nauseous, and you may vomit. °You may have heartburn. °You may develop headaches. °You may develop constipation. °Your gums may bleed and may be sensitive to brushing and flossing. °Other changes °You may tire easily. °You may urinate more often. °Your menstrual periods will stop. °You may have a loss of appetite. °You may develop cravings for certain kinds of food. °You may have changes in your emotions from day to day. °You may have more vivid and strange dreams. °Follow these instructions at home: °Medicines °Follow your health care provider's instructions regarding medicine use. Specific medicines may be either safe or unsafe to take during pregnancy. Do not take any medicines unless told to by your health care provider. °Take a prenatal vitamin that contains at least 600 micrograms (mcg) of folic acid. °Eating and drinking °Eat a healthy diet that includes fresh fruits and vegetables, whole grains, good sources of protein such as meat, eggs, or tofu,  and low-fat dairy products. °Avoid raw meat and unpasteurized juice, milk, and cheese. These carry germs that can harm you and your baby. °If you feel nauseous or you vomit: °Eat 4 or 5 small meals a day instead of 3 large meals. °Try eating a few soda crackers. °Drink liquids between meals instead of during meals. °You may need to take these actions to prevent or treat constipation: °Drink enough fluid to keep your urine pale yellow. °Eat foods that are high in fiber, such as beans, whole grains, and fresh fruits and vegetables. °Limit foods that are high in fat and processed sugars, such as fried or sweet foods. °Activity °Exercise only as directed by your health care provider. Most people can continue their usual exercise routine during pregnancy. Try to exercise for 30 minutes at least 5 days a week. °Stop exercising if you develop pain or cramping in the lower abdomen or lower back. °Avoid exercising if it is very hot or humid or if you are at high altitude. °Avoid heavy lifting. °If you choose to, you may have sex unless your health care provider tells you not to. °Relieving pain and discomfort °Wear a good support bra to relieve breast tenderness. °Rest with your legs elevated if you have leg cramps or low back pain. °If you develop bulging veins (varicose veins) in your legs: °Wear support hose as told by your health care provider. °Elevate your feet for 15 minutes, 3-4 times a day. °Limit salt in your diet. °Safety °Wear your seat belt at all times when driving   or riding in a car. °Talk with your health care provider if someone is verbally or physically abusive to you. °Talk with your health care provider if you are feeling sad or have thoughts of hurting yourself. °Lifestyle °Do not use hot tubs, steam rooms, or saunas. °Do not douche. Do not use tampons or scented sanitary pads. °Do not use herbal remedies, alcohol, illegal drugs, or medicines that are not approved by your health care provider. Chemicals  in these products can harm your baby. °Do not use any products that contain nicotine or tobacco, such as cigarettes, e-cigarettes, and chewing tobacco. If you need help quitting, ask your health care provider. °Avoid cat litter boxes and soil used by cats. These carry germs that can cause birth defects in the baby and possibly loss of the unborn baby (fetus) by miscarriage or stillbirth. °General instructions °During routine prenatal visits in the first trimester, your health care provider will do a physical exam, perform necessary tests, and ask you how things are going. Keep all follow-up visits. This is important. °Ask for help if you have counseling or nutritional needs during pregnancy. Your health care provider can offer advice or refer you to specialists for help with various needs. °Schedule a dentist appointment. At home, brush your teeth with a soft toothbrush. Floss gently. °Write down your questions. Take them to your prenatal visits. °Where to find more information °American Pregnancy Association: americanpregnancy.org °American College of Obstetricians and Gynecologists: acog.org/en/Womens%20Health/Pregnancy °Office on Women's Health: womenshealth.gov/pregnancy °Contact a health care provider if you have: °Dizziness. °A fever. °Mild pelvic cramps, pelvic pressure, or nagging pain in the abdominal area. °Nausea, vomiting, or diarrhea that lasts for 24 hours or longer. °A bad-smelling vaginal discharge. °Pain when you urinate. °Known exposure to a contagious illness, such as chickenpox, measles, Zika virus, HIV, or hepatitis. °Get help right away if you have: °Spotting or bleeding from your vagina. °Severe abdominal cramping or pain. °Shortness of breath or chest pain. °Any kind of trauma, such as from a fall or a car crash. °New or increased pain, swelling, or redness in an arm or leg. °Summary °The first trimester of pregnancy starts on the first day of your last menstrual period until the end of week  12 (months 1 through 3). °Eating 4 or 5 small meals a day rather than 3 large meals may help to relieve nausea and vomiting. °Do not use any products that contain nicotine or tobacco, such as cigarettes, e-cigarettes, and chewing tobacco. If you need help quitting, ask your health care provider. °Keep all follow-up visits. This is important. °This information is not intended to replace advice given to you by your health care provider. Make sure you discuss any questions you have with your health care provider. °Document Revised: 03/30/2020 Document Reviewed: 02/04/2020 °Elsevier Patient Education © 2022 [employer] Inc. ° °

## 2021-09-21 LAB — COMPREHENSIVE METABOLIC PANEL
ALT: 25 IU/L (ref 0–32)
AST: 18 IU/L (ref 0–40)
Albumin/Globulin Ratio: 1.1 — ABNORMAL LOW (ref 1.2–2.2)
Albumin: 3.6 g/dL — ABNORMAL LOW (ref 3.8–4.8)
Alkaline Phosphatase: 91 IU/L (ref 44–121)
BUN/Creatinine Ratio: 13 (ref 9–23)
BUN: 8 mg/dL (ref 6–20)
Bilirubin Total: <0.2 mg/dL (ref 0.0–1.2)
CO2: 20 mmol/L (ref 20–29)
Calcium: 9.1 mg/dL (ref 8.7–10.2)
Chloride: 104 mmol/L (ref 96–106)
Creatinine, Ser: 0.61 mg/dL (ref 0.57–1.00)
Globulin, Total: 3.4 g/dL (ref 1.5–4.5)
Glucose: 86 mg/dL (ref 70–99)
Potassium: 3.9 mmol/L (ref 3.5–5.2)
Sodium: 139 mmol/L (ref 134–144)
Total Protein: 7 g/dL (ref 6.0–8.5)
eGFR: 122 mL/min/{1.73_m2} (ref >59)

## 2021-09-21 LAB — PROTEIN / CREATININE RATIO, URINE
Creatinine, Urine: 187.7 mg/dL
Protein, Ur: 13.9 mg/dL
Protein/Creat Ratio: 74 mg/g creat (ref 0–200)

## 2021-09-21 LAB — CBC/D/PLT+RPR+RH+ABO+RUBIGG...
Antibody Screen: NEGATIVE
Basophils Absolute: 0 10*3/uL (ref 0.0–0.2)
Basos: 0 %
EOS (ABSOLUTE): 0.1 10*3/uL (ref 0.0–0.4)
Eos: 2 %
HCV Ab: <0.1 s/co ratio (ref 0.0–0.9)
HIV Screen 4th Generation wRfx: NONREACTIVE
Hematocrit: 32.7 % — ABNORMAL LOW (ref 34.0–46.6)
Hemoglobin: 10.9 g/dL — ABNORMAL LOW (ref 11.1–15.9)
Hepatitis B Surface Ag: NEGATIVE
Immature Grans (Abs): 0 10*3/uL (ref 0.0–0.1)
Immature Granulocytes: 1 %
Lymphocytes Absolute: 1.7 10*3/uL (ref 0.7–3.1)
Lymphs: 28 %
MCH: 25.5 pg — ABNORMAL LOW (ref 26.6–33.0)
MCHC: 33.3 g/dL (ref 31.5–35.7)
MCV: 76 fL — ABNORMAL LOW (ref 79–97)
Monocytes Absolute: 0.5 10*3/uL (ref 0.1–0.9)
Monocytes: 7 %
Neutrophils Absolute: 3.9 10*3/uL (ref 1.4–7.0)
Neutrophils: 62 %
Platelets: 249 10*3/uL (ref 150–450)
RBC: 4.28 x10E6/uL (ref 3.77–5.28)
RDW: 14.7 % (ref 11.7–15.4)
RPR Ser Ql: NONREACTIVE
Rh Factor: POSITIVE
Rubella Antibodies, IGG: 9.39 index (ref >0.99)
WBC: 6.3 10*3/uL (ref 3.4–10.8)

## 2021-09-21 LAB — GC/CHLAMYDIA PROBE AMP (~~LOC~~) NOT AT ARMC
Chlamydia: NEGATIVE
Comment: NEGATIVE
Comment: NORMAL
Neisseria Gonorrhea: NEGATIVE

## 2021-09-21 LAB — HCV INTERPRETATION

## 2021-09-21 LAB — HEMOGLOBIN A1C
Est. average glucose Bld gHb Est-mCnc: 117 mg/dL
Hgb A1c MFr Bld: 5.7 % — ABNORMAL HIGH (ref 4.8–5.6)

## 2021-09-21 LAB — TSH: TSH: 0.201 u[IU]/mL — ABNORMAL LOW (ref 0.450–4.500)

## 2021-09-22 DIAGNOSIS — R7303 Prediabetes: Secondary | ICD-10-CM | POA: Insufficient documentation

## 2021-09-22 LAB — URINE CULTURE, OB REFLEX: Organism ID, Bacteria: NO GROWTH

## 2021-09-22 LAB — CULTURE, OB URINE

## 2021-09-22 NOTE — Addendum Note (Signed)
Addended by: Jaynie Collins A on: 09/22/2021 12:40 PM   Modules accepted: Orders

## 2021-09-27 ENCOUNTER — Encounter: Payer: Self-pay | Admitting: Obstetrics & Gynecology

## 2021-10-04 ENCOUNTER — Telehealth: Payer: Self-pay

## 2021-10-04 NOTE — Telephone Encounter (Addendum)
Detailed message left on pt's voicemail (per DPR) letting her know that blood work showed prediabetes and that she needs to call office to schedule early 2 hour GTT. Office number provided for pt. MyChart message also sent to pt.   ----- Message from Tereso Newcomer, MD sent at 09/22/2021 12:52 PM EST ----- This shows prediabetes. Early 2 hr GTT recommended. Please call to inform patient of results and recommendations.

## 2021-10-05 ENCOUNTER — Encounter: Payer: Self-pay | Admitting: Obstetrics & Gynecology

## 2021-10-19 ENCOUNTER — Encounter: Payer: Self-pay | Admitting: Obstetrics

## 2021-10-19 ENCOUNTER — Encounter: Payer: Self-pay | Admitting: Obstetrics and Gynecology

## 2021-10-19 ENCOUNTER — Ambulatory Visit (INDEPENDENT_AMBULATORY_CARE_PROVIDER_SITE_OTHER): Payer: Medicaid Other | Admitting: Obstetrics and Gynecology

## 2021-10-19 ENCOUNTER — Other Ambulatory Visit: Payer: Self-pay

## 2021-10-19 ENCOUNTER — Other Ambulatory Visit (HOSPITAL_COMMUNITY)
Admission: RE | Admit: 2021-10-19 | Discharge: 2021-10-19 | Disposition: A | Discharge status: Home / Self Care | Payer: Medicaid Other | Source: Ambulatory Visit | Attending: Obstetrics and Gynecology | Admitting: Obstetrics and Gynecology

## 2021-10-19 VITALS — BP 113/74 | HR 106 | Wt 203.0 lb

## 2021-10-19 DIAGNOSIS — O0990 Supervision of high risk pregnancy, unspecified, unspecified trimester: Secondary | ICD-10-CM

## 2021-10-19 DIAGNOSIS — O99210 Obesity complicating pregnancy, unspecified trimester: Secondary | ICD-10-CM | POA: Insufficient documentation

## 2021-10-19 DIAGNOSIS — O09299 Supervision of pregnancy with other poor reproductive or obstetric history, unspecified trimester: Secondary | ICD-10-CM

## 2021-10-19 DIAGNOSIS — R7303 Prediabetes: Secondary | ICD-10-CM

## 2021-10-19 DIAGNOSIS — Z789 Other specified health status: Secondary | ICD-10-CM

## 2021-10-19 MED ORDER — TERCONAZOLE 0.8 % VA CREA: 1.0000 | TOPICAL_CREAM | Freq: Every day | VAGINAL | 0 refills | Status: DC

## 2021-10-19 NOTE — Progress Notes (Addendum)
° °  PRENATAL VISIT NOTE  Subjective:  Jacqueline Irwin is a 30 y.o. [phone] at [redacted]w[redacted]d being seen today for ongoing prenatal care.  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  Patient reports vaginal irritation.  Contractions: Not present. Vag. Bleeding: None.  Movement: Present. Denies leaking of fluid.   The following portions of the patient's history were reviewed and updated as appropriate: allergies, current medications, past family history, past medical history, past social history, past surgical history and problem list.   Objective:   Vitals:   10/19/21 0936  BP: 113/74  Pulse: (!) 106  Weight: 203 lb (92.1 kg)    Fetal Status: Fetal Heart Rate (bpm): 150   Movement: Present     General:  Alert, oriented and cooperative. Patient is in no acute distress.  Skin: Skin is warm and dry. No rash noted.   Cardiovascular: Normal heart rate noted  Respiratory: Normal respiratory effort, no problems with respiration noted  Abdomen: Soft, gravid, appropriate for gestational age.  Pain/Pressure: Absent     Pelvic: Cervical exam deferred        Extremities: Normal range of motion.  Edema: None  Mental Status: Normal mood and affect. Normal behavior. Normal judgment and thought content.   Assessment and Plan:  Pregnancy: [id number] at [redacted]w[redacted]d 1. Supervision of high risk pregnancy, antepartum Patient is doing well reporting vaginal itching with the presence of a white discharge Vaginal swab collected and Rx terazol provided for presumed yeast infection Anatomy ultrasound  scheduled Thyroid studies today AFP today  2. Prediabetes Abnormal A1C Patient needs early glucola    3. Hx of preeclampsia, prior pregnancy, currently pregnant Continue ASA  4. History of intrauterine fetal death at 36 weeks, currently pregnant   5. Maternal obesity syndrome, antepartum   6. Non-English speaking patient Arabic interpreter used  Preterm labor symptoms and general obstetric precautions including but not limited to vaginal bleeding, contractions, leaking of fluid and fetal movement were reviewed in detail with the patient. Please refer to After Visit Summary for other counseling recommendations.   Return in about 4 weeks (around 11/16/2021) for in person, ROB, High risk.  Future Appointments  Date Time Provider Department Center  11/07/2021  8:00 AM [facility] NURSE [facility]  11/07/2021  8:15 AM [facility] US2 [facility]    Catalina Antigua, MD

## 2021-10-19 NOTE — Addendum Note (Signed)
Addended by: Catalina Antigua on: 10/19/2021 10:21 AM   Modules accepted: Orders

## 2021-10-19 NOTE — Progress Notes (Signed)
Pt presents for ROB, AFP, T3, and T4 labs.  Pt reports outer vaginal itching without discharge x 2 weeks. Detailed anatomy scheduled 11/07/21. Used Arabic interpreter Vernona Rieger [phone]

## 2021-10-20 LAB — CERVICOVAGINAL ANCILLARY ONLY
Bacterial Vaginitis (gardnerella): NEGATIVE
Candida Glabrata: NEGATIVE
Candida Vaginitis: POSITIVE — AB
Comment: NEGATIVE
Comment: NEGATIVE
Comment: NEGATIVE

## 2021-10-21 LAB — AFP, SERUM, OPEN SPINA BIFIDA
AFP MoM: 2.3
AFP Value: 69.3 ng/mL
Gest. Age on Collection Date: 16.3 weeks
Maternal Age At EDD: 33 yr
OSBR Risk 1 IN: 426
Test Results:: NEGATIVE
Weight: 203 [lb_av]

## 2021-10-23 ENCOUNTER — Other Ambulatory Visit: Payer: Medicaid Other

## 2021-10-23 ENCOUNTER — Other Ambulatory Visit: Payer: Self-pay

## 2021-10-23 DIAGNOSIS — R7309 Other abnormal glucose: Secondary | ICD-10-CM

## 2021-10-24 ENCOUNTER — Encounter: Payer: Self-pay | Admitting: Obstetrics & Gynecology

## 2021-10-24 DIAGNOSIS — Z8632 Personal history of gestational diabetes: Secondary | ICD-10-CM | POA: Insufficient documentation

## 2021-10-24 DIAGNOSIS — O24419 Gestational diabetes mellitus in pregnancy, unspecified control: Secondary | ICD-10-CM | POA: Insufficient documentation

## 2021-10-24 LAB — GLUCOSE TOLERANCE, 2 HOURS W/ 1HR
Glucose, 1 hour: 148 mg/dL (ref 70–179)
Glucose, 2 hour: 107 mg/dL (ref 70–152)
Glucose, Fasting: 107 mg/dL — ABNORMAL HIGH (ref 70–91)

## 2021-10-25 ENCOUNTER — Other Ambulatory Visit: Payer: Self-pay

## 2021-10-25 DIAGNOSIS — O24419 Gestational diabetes mellitus in pregnancy, unspecified control: Secondary | ICD-10-CM

## 2021-10-25 MED ORDER — ACCU-CHEK GUIDE VI STRP: ORAL_STRIP | 12 refills | Status: DC

## 2021-10-25 MED ORDER — ACCU-CHEK SOFTCLIX LANCETS MISC: 12 refills | Status: DC

## 2021-10-25 MED ORDER — ACCU-CHEK GUIDE W/DEVICE KIT
1.0000 | PACK | Freq: Four times a day (QID) | 0 refills | Status: DC
Start: 2021-10-25 — End: 2022-03-14

## 2021-11-05 NOTE — L&D Delivery Note (Addendum)
Vaginal Delivery Note ? ?Pre-procedure Diagnosis: SIUP @ [redacted]w[redacted]d ?Indications: 31 y.o. [id number] Estimated Date of Delivery: 04/02/22 here today for IOL . Her pregnancy has been complicated by FGR (7%ile) with elevated S/D ratio 3.51, A1GDM, intrapartum hypertension . Her labor course was complicated by intrapartum gHTN.   ?Post-procedure Diagnosis: SIUP @ [redacted]w[redacted]d ; same ? ?Provider: Ardyth Harps, SNM; Cleone Slim CNM ? ?Anesthesia: epidural ? ?Complications: none ? ?Delivery Estimated Blood Loss (EBL):  55 mL ? ?Transfusions: none ? ?Pathology: placenta to l&d routinely ? ?Labor Events: ?Rupture date: 03/12/2022 , at 11:46 AM .  ?Rupture type: Artificial [2]  ?Fluid characteristic: Clear [1];Pink [5]  ?Interval from ROM to Delivery: 8h 28m ?Induction: AROM [2];Pitocin [5]  ?Augmentation: None [1]  ?Sex: M ? ?Delivery Information for baby boy of Jacqueline Irwin ?Time of Birth: 8:21 PM  ?Baby Weight:  pending ? ?APGARS One minute Five minutes ?Totals: 9  9   ?Newborn is SGA and well. Patient plans to breastfeed, plans to bottle feed.   ? ?Procedure:  ?Called to bedside for complete dilation. Presenting part visible through vaginal introitus. Patient was placed in lithotomy position, draped under in a routine fashion. Good maternal expulsive efforts resulting in SVD of a viable female infant in cephalic presentation delivered spontaneously over a supported perineum. No nuchal cord. No dystocia. No meconium present. Nose and mouth suctioned with bulb; cord clamped and cut. The placenta was then delivered spontaneously and intact via CCT; Tomasa Blase; [facility]. The uterine fundus was firm after uterine massage and with a 500 ml bolus of 20 units of pitocin. Inspection revealed  a hemostatic perineal abrasion not requiring repair  Patient tolerated delivery well. Instrument, sponge, and needle counts were correct at the end of the procedure. ? ?Disposition: stable to transfer to [facility] ? ?Electronically Signed By: ?Ardyth Harps, SNM ?03/12/22  8:50 PM   ? ? ?I was gloved and present for delivery in its entirety.  Second stage of labor progressed uneventfully. ? Complications: none ? Lacerations: abrasion, hemostatic and left unrepaired ? ?Rolm Bookbinder, CNM ?11:25 PM ? ?

## 2021-11-07 ENCOUNTER — Ambulatory Visit: Payer: Medicaid Other | Attending: Obstetrics & Gynecology

## 2021-11-07 ENCOUNTER — Encounter: Payer: Self-pay | Admitting: *Deleted

## 2021-11-07 ENCOUNTER — Other Ambulatory Visit: Payer: Self-pay | Admitting: *Deleted

## 2021-11-07 ENCOUNTER — Other Ambulatory Visit: Payer: Self-pay

## 2021-11-07 ENCOUNTER — Ambulatory Visit: Payer: Medicaid Other | Admitting: *Deleted

## 2021-11-07 VITALS — BP 117/71 | HR 94

## 2021-11-07 DIAGNOSIS — O09299 Supervision of pregnancy with other poor reproductive or obstetric history, unspecified trimester: Secondary | ICD-10-CM | POA: Diagnosis present

## 2021-11-07 DIAGNOSIS — Z6841 Body Mass Index (BMI) 40.0 and over, adult: Secondary | ICD-10-CM

## 2021-11-07 DIAGNOSIS — O99210 Obesity complicating pregnancy, unspecified trimester: Secondary | ICD-10-CM | POA: Diagnosis present

## 2021-11-07 DIAGNOSIS — O0990 Supervision of high risk pregnancy, unspecified, unspecified trimester: Secondary | ICD-10-CM

## 2021-11-07 DIAGNOSIS — O24410 Gestational diabetes mellitus in pregnancy, diet controlled: Secondary | ICD-10-CM

## 2021-11-07 DIAGNOSIS — Z8759 Personal history of other complications of pregnancy, childbirth and the puerperium: Secondary | ICD-10-CM

## 2021-11-13 ENCOUNTER — Other Ambulatory Visit: Payer: Self-pay

## 2021-11-13 ENCOUNTER — Encounter: Payer: Self-pay | Admitting: Obstetrics and Gynecology

## 2021-11-13 ENCOUNTER — Ambulatory Visit (INDEPENDENT_AMBULATORY_CARE_PROVIDER_SITE_OTHER): Payer: Medicaid Other | Admitting: Obstetrics and Gynecology

## 2021-11-13 VITALS — BP 112/74 | HR 100 | Wt 204.0 lb

## 2021-11-13 DIAGNOSIS — R7989 Other specified abnormal findings of blood chemistry: Secondary | ICD-10-CM

## 2021-11-13 DIAGNOSIS — O99210 Obesity complicating pregnancy, unspecified trimester: Secondary | ICD-10-CM

## 2021-11-13 DIAGNOSIS — O0990 Supervision of high risk pregnancy, unspecified, unspecified trimester: Secondary | ICD-10-CM

## 2021-11-13 DIAGNOSIS — O24410 Gestational diabetes mellitus in pregnancy, diet controlled: Secondary | ICD-10-CM

## 2021-11-13 DIAGNOSIS — O09299 Supervision of pregnancy with other poor reproductive or obstetric history, unspecified trimester: Secondary | ICD-10-CM

## 2021-11-13 DIAGNOSIS — Z789 Other specified health status: Secondary | ICD-10-CM

## 2021-11-13 MED ORDER — PANTOPRAZOLE SODIUM 40 MG PO TBEC: 40.0000 mg | DELAYED_RELEASE_TABLET | Freq: Every day | ORAL | 3 refills | Status: DC

## 2021-11-13 MED ORDER — NYSTATIN-TRIAMCINOLONE 100000-0.1 UNIT/GM-% EX OINT: 1.0000 "application " | TOPICAL_OINTMENT | Freq: Two times a day (BID) | CUTANEOUS | 0 refills | Status: DC

## 2021-11-13 MED ORDER — PROMETHAZINE HCL 25 MG PO TABS: 25.0000 mg | ORAL_TABLET | Freq: Four times a day (QID) | ORAL | 2 refills | Status: DC | PRN

## 2021-11-13 NOTE — Progress Notes (Signed)
+   Fetal movement. Pt states she used terazol cream for yeast infection but is still having itching. Denies any discharge. Pt states sugars are good. Fasting this morning was 90. Pt states she is having some N/V. She states she will throw up once or twice every day. Pt request Rx to help with nausea.

## 2021-11-13 NOTE — Progress Notes (Signed)
° °  PRENATAL VISIT NOTE  Subjective:  Jacqueline Irwin is a 31 y.o. [phone] at [redacted]w[redacted]d being seen today for ongoing prenatal care.  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  Patient reports nausea.  Contractions: Not present. Vag. Bleeding: None.  Movement: Present. Denies leaking of fluid.   The following portions of the patient's history were reviewed and updated as appropriate: allergies, current medications, past family history, past medical history, past social history, past surgical history and problem list.   Objective:   Vitals:   11/13/21 0958  BP: 112/74  Pulse: 100  Weight: 204 lb (92.5 kg)    Fetal Status: Fetal Heart Rate (bpm): 148 Fundal Height: 20 cm Movement: Present     General:  Alert, oriented and cooperative. Patient is in no acute distress.  Skin: Skin is warm and dry. No rash noted.   Cardiovascular: Normal heart rate noted  Respiratory: Normal respiratory effort, no problems with respiration noted  Abdomen: Soft, gravid, appropriate for gestational age.  Pain/Pressure: Absent     Pelvic: Cervical exam deferred        Extremities: Normal range of motion.  Edema: None  Mental Status: Normal mood and affect. Normal behavior. Normal judgment and thought content.   Assessment and Plan:  Pregnancy: [id number] at [redacted]w[redacted]d 1. Supervision of high risk pregnancy, antepartum Rx Phenergan and protonix provided to help with nausea  2. Diet controlled gestational diabetes mellitus (GDM), antepartum Patient did not  meet with diabetes educator Referral placed She admits to monitoring intermittently fasting CBGs only and reports readings from [id number] Discussed implications of poorly controlled diabetes on mother and fetus Discussed importance of monitoring CBGs and maintaining euglycemia Patient verbalized understanding and all questions were answered Thyroid studies today Fetal echo ordered Growth ultrasound 1/31  3. Maternal obesity syndrome, antepartum Continue ASA  4. History of intrauterine fetal death at 27 weeks, currently pregnant Follow up ultrasound per MFM  5. Hx of preeclampsia, prior pregnancy, currently pregnant   6. Non-English speaking patient Arabic interpreter used  Preterm labor symptoms and general obstetric precautions including but not limited to vaginal bleeding, contractions, leaking of fluid and fetal movement were reviewed in detail with the patient. Please refer to After Visit Summary for other counseling recommendations.   Return in about 4 weeks (around 12/11/2021) for in person, ROB, High risk.  Future Appointments  Date Time Provider [place]  11/23/2021  2:15 PM [facility]  12/05/2021 10:30 AM [facility] NURSE [facility]  12/05/2021 10:45 AM [facility] US6 [facility]  12/11/2021  8:35 AM Nethra Mehlberg, Vickii Chafe, MD [facility] None    Mora Bellman, MD

## 2021-11-14 LAB — T3, FREE: T3, Free: 3 pg/mL (ref 2.0–4.4)

## 2021-11-14 LAB — T4, FREE: Free T4: 1.2 ng/dL (ref 0.82–1.77)

## 2021-11-23 ENCOUNTER — Ambulatory Visit (INDEPENDENT_AMBULATORY_CARE_PROVIDER_SITE_OTHER): Payer: Medicaid Other | Admitting: Registered"

## 2021-11-23 ENCOUNTER — Encounter: Payer: Medicaid Other | Attending: Obstetrics and Gynecology | Admitting: Registered"

## 2021-11-23 ENCOUNTER — Other Ambulatory Visit: Payer: Self-pay

## 2021-11-23 DIAGNOSIS — O24410 Gestational diabetes mellitus in pregnancy, diet controlled: Secondary | ICD-10-CM

## 2021-11-23 NOTE — Progress Notes (Signed)
[employer] Video Arabic interpreter Beryle Beams [phone]  Patient was seen for Gestational Diabetes self-management on 11/23/21  Start time 1420 and End time 1513   Estimated due date: 04/02/22; [redacted]w[redacted]d  Clinical: Medications: reviewed Medical History: reviewed Labs: OGTT FBS 107 mg/dL, Y6A 6.3%   Dietary and Lifestyle History: Pt states she has not had GDM prior.    Patient stopped eating fruit about 1 week ago after seeing a 2 hr PPBG reading of 140 after eating 2 oranges for a snack after a meal. Pt reports she eats a variety of vegetables. Patient reports she consumes 1 serving of dairy per day.     Physical Activity: ADL Stress: not assesed Sleep: 8-10 hrs   24 hr Recall:  First Meal: chicken salad, pita bread Snack: Second meal:fish or soup Snack: Third meal: (usually not hungry for 3 meals per day) Snack: Beverages: 4 bottles of water, 1 c 2% milk   NUTRITION INTERVENTION  Nutrition education (E-1) on the following topics:    Initial    Follow-up   [x]         []         Definition of Gestational Diabetes [x]         []         Why dietary management is important in controlling blood glucose [x]         []         Effects each nutrient has on blood glucose levels []         []         Simple carbohydrates vs complex carbohydrates []         []         Fluid intake [x]         []         Creating a balanced meal plan [x]         []         Carbohydrate counting  []         []         When to check blood glucose levels []         []         Proper blood glucose monitoring techniques [x]         []         Effect of stress and stress reduction techniques  [x]         []         Exercise effect on blood glucose levels, appropriate exercise during pregnancy []         []         Importance of limiting caffeine and abstaining from alcohol and smoking []         []         Medications used for blood sugar control during pregnancy [x]         []         Hypoglycemia and rule of 15 [x]         []          Postpartum self care   Patient already has a meter, is testing pre breakfast and 2 hours after each meal. FBS: 74-97 mg/dL Postprandial: mg/dL (mostly WNL)  Patient instructed to monitor glucose levels: FBS: 60 - ? 95 mg/dL (some clinics use 90 for cutoff) 1 hour: ? 140 mg/dL 2 hour: ? mg/dL  Patient received handouts: Nutrition Diabetes and Pregnancy Carbohydrate Counting List  Patient will be seen for follow-up as needed.

## 2021-11-27 ENCOUNTER — Other Ambulatory Visit: Payer: Self-pay

## 2021-12-05 ENCOUNTER — Other Ambulatory Visit: Payer: Self-pay | Admitting: *Deleted

## 2021-12-05 ENCOUNTER — Encounter: Payer: Self-pay | Admitting: *Deleted

## 2021-12-05 ENCOUNTER — Ambulatory Visit: Payer: Medicaid Other | Attending: Obstetrics

## 2021-12-05 ENCOUNTER — Other Ambulatory Visit: Payer: Self-pay

## 2021-12-05 ENCOUNTER — Ambulatory Visit: Payer: Medicaid Other | Admitting: *Deleted

## 2021-12-05 VITALS — BP 112/70 | HR 102

## 2021-12-05 DIAGNOSIS — O24410 Gestational diabetes mellitus in pregnancy, diet controlled: Secondary | ICD-10-CM

## 2021-12-05 DIAGNOSIS — Z3A23 23 weeks gestation of pregnancy: Secondary | ICD-10-CM | POA: Diagnosis not present

## 2021-12-05 DIAGNOSIS — O09292 Supervision of pregnancy with other poor reproductive or obstetric history, second trimester: Secondary | ICD-10-CM | POA: Diagnosis not present

## 2021-12-05 DIAGNOSIS — O99212 Obesity complicating pregnancy, second trimester: Secondary | ICD-10-CM | POA: Diagnosis present

## 2021-12-05 DIAGNOSIS — O99210 Obesity complicating pregnancy, unspecified trimester: Secondary | ICD-10-CM | POA: Insufficient documentation

## 2021-12-05 DIAGNOSIS — O09299 Supervision of pregnancy with other poor reproductive or obstetric history, unspecified trimester: Secondary | ICD-10-CM

## 2021-12-05 DIAGNOSIS — Z6841 Body Mass Index (BMI) 40.0 and over, adult: Secondary | ICD-10-CM

## 2021-12-05 DIAGNOSIS — Z8759 Personal history of other complications of pregnancy, childbirth and the puerperium: Secondary | ICD-10-CM

## 2021-12-11 ENCOUNTER — Encounter: Payer: Self-pay | Admitting: Obstetrics and Gynecology

## 2021-12-11 ENCOUNTER — Ambulatory Visit (INDEPENDENT_AMBULATORY_CARE_PROVIDER_SITE_OTHER): Payer: Medicaid Other | Admitting: Obstetrics and Gynecology

## 2021-12-11 ENCOUNTER — Other Ambulatory Visit: Payer: Self-pay

## 2021-12-11 VITALS — BP 112/76 | HR 103 | Wt 206.7 lb

## 2021-12-11 DIAGNOSIS — O26892 Other specified pregnancy related conditions, second trimester: Secondary | ICD-10-CM

## 2021-12-11 DIAGNOSIS — R300 Dysuria: Secondary | ICD-10-CM

## 2021-12-11 DIAGNOSIS — Z3A24 24 weeks gestation of pregnancy: Secondary | ICD-10-CM

## 2021-12-11 DIAGNOSIS — O99210 Obesity complicating pregnancy, unspecified trimester: Secondary | ICD-10-CM

## 2021-12-11 DIAGNOSIS — O24410 Gestational diabetes mellitus in pregnancy, diet controlled: Secondary | ICD-10-CM

## 2021-12-11 DIAGNOSIS — Z789 Other specified health status: Secondary | ICD-10-CM

## 2021-12-11 DIAGNOSIS — O0990 Supervision of high risk pregnancy, unspecified, unspecified trimester: Secondary | ICD-10-CM

## 2021-12-11 DIAGNOSIS — O09299 Supervision of pregnancy with other poor reproductive or obstetric history, unspecified trimester: Secondary | ICD-10-CM

## 2021-12-11 LAB — POCT URINALYSIS DIPSTICK
Bilirubin, UA: NEGATIVE
Blood, UA: NEGATIVE
Glucose, UA: NEGATIVE
Ketones, UA: NEGATIVE
Leukocytes, UA: NEGATIVE
Nitrite, UA: NEGATIVE
Odor: NEGATIVE
Protein, UA: NEGATIVE
Spec Grav, UA: 1.015 (ref 1.010–1.025)
Urobilinogen, UA: 0.2 E.U./dL
pH, UA: 7 (ref 5.0–8.0)

## 2021-12-11 NOTE — Progress Notes (Signed)
° °  PRENATAL VISIT NOTE  Subjective:  Jacqueline Irwin is a 31 y.o. [phone] at [redacted]w[redacted]d being seen today for ongoing prenatal care.  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  Patient reports dysuria for 1 week.  Contractions: Not present. Vag. Bleeding: None.  Movement: Present. Denies leaking of fluid.   The following portions of the patient's history were reviewed and updated as appropriate: allergies, current medications, past family history, past medical history, past social history, past surgical history and problem list.   Objective:   Vitals:   12/11/21 0844  BP: 112/76  Pulse: (!) 103  Weight: 206 lb 11.2 oz (93.8 kg)    Fetal Status: Fetal Heart Rate (bpm): 148 (Simultaneous filing. User may not have seen previous data.) Fundal Height: 24 cm Movement: Present     General:  Alert, oriented and cooperative. Patient is in no acute distress.  Skin: Skin is warm and dry. No rash noted.   Cardiovascular: Normal heart rate noted  Respiratory: Normal respiratory effort, no problems with respiration noted  Abdomen: Soft, gravid, appropriate for gestational age.  Pain/Pressure: Absent     Pelvic: Cervical exam deferred        Extremities: Normal range of motion.  Edema: None  Mental Status: Normal mood and affect. Normal behavior. Normal judgment and thought content.   Assessment and Plan:  Pregnancy: [id number] at [redacted]w[redacted]d 1. Supervision of high risk pregnancy, antepartum Patient is doing well Urine culture collected  2. Diet  controlled gestational diabetes mellitus (GDM), antepartum CBGs reviewed and great majority within range with 2 pp values of 131. Will continue diet control Normal growth ultrasound on 1/31 Scheduled for follow up growth on 3/6  3. Hx of preeclampsia, prior pregnancy, currently pregnant Normotensive today Reviewed si/sx of preeclampsia  4. Maternal obesity syndrome, antepartum Continue ASA  5. History of intrauterine fetal death at 37 weeks, currently pregnant Antenatal testing per MFM schedule  6. Non-English speaking patient Arabic interpreter used during encounter  Preterm labor symptoms and general obstetric precautions including but not limited to vaginal bleeding, contractions, leaking of fluid and fetal movement were reviewed in detail with the patient. Please refer to After Visit Summary for other counseling recommendations.   Return in about 3 weeks (around 01/01/2022) for in person, ROB, High risk.  Future Appointments  Date Time Provider [place]  01/08/2022  9:00 AM [facility] NURSE [facility]  01/08/2022  9:15 AM [facility] US2 [facility]    Mora Bellman, MD

## 2021-12-11 NOTE — Progress Notes (Signed)
Pt presents for ROB reports dysuria x 1 week.  Used Arabic interpreter Ahmed [phone] CBG's available per pt

## 2021-12-15 LAB — CULTURE, OB URINE

## 2021-12-15 LAB — URINE CULTURE, OB REFLEX

## 2021-12-18 ENCOUNTER — Encounter: Payer: Self-pay | Admitting: Obstetrics and Gynecology

## 2021-12-18 DIAGNOSIS — R8271 Bacteriuria: Secondary | ICD-10-CM | POA: Insufficient documentation

## 2021-12-18 MED ORDER — CEPHALEXIN 500 MG PO CAPS: 500.0000 mg | ORAL_CAPSULE | Freq: Four times a day (QID) | ORAL | 2 refills | Status: DC

## 2021-12-18 NOTE — Addendum Note (Signed)
Addended by: Mora Bellman on: 12/18/2021 09:28 AM   Modules accepted: Orders

## 2022-01-01 ENCOUNTER — Encounter: Payer: Self-pay | Admitting: Obstetrics and Gynecology

## 2022-01-01 ENCOUNTER — Ambulatory Visit (INDEPENDENT_AMBULATORY_CARE_PROVIDER_SITE_OTHER): Payer: Medicaid Other | Admitting: Obstetrics and Gynecology

## 2022-01-01 ENCOUNTER — Other Ambulatory Visit: Payer: Self-pay

## 2022-01-01 VITALS — BP 105/71 | HR 94 | Wt 209.0 lb

## 2022-01-01 DIAGNOSIS — O24410 Gestational diabetes mellitus in pregnancy, diet controlled: Secondary | ICD-10-CM

## 2022-01-01 DIAGNOSIS — O09299 Supervision of pregnancy with other poor reproductive or obstetric history, unspecified trimester: Secondary | ICD-10-CM

## 2022-01-01 DIAGNOSIS — R8271 Bacteriuria: Secondary | ICD-10-CM

## 2022-01-01 DIAGNOSIS — O0990 Supervision of high risk pregnancy, unspecified, unspecified trimester: Secondary | ICD-10-CM

## 2022-01-01 DIAGNOSIS — O99210 Obesity complicating pregnancy, unspecified trimester: Secondary | ICD-10-CM

## 2022-01-01 DIAGNOSIS — Z789 Other specified health status: Secondary | ICD-10-CM

## 2022-01-01 NOTE — Progress Notes (Signed)
° °  PRENATAL VISIT NOTE  Subjective:  Jacqueline Irwin is a 31 y.o. [phone] at [redacted]w[redacted]d being seen today for ongoing prenatal care.  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  Patient reports no complaints.  Contractions: Not present. Vag. Bleeding: None.  Movement: Present. Denies leaking of fluid.   The following portions of the patient's history were reviewed and updated as appropriate: allergies, current medications, past family history, past medical history, past social history, past surgical history and problem list.   Objective:   Vitals:   01/01/22 1528  BP: 105/71  Pulse: 94  Weight: 209 lb (94.8 kg)    Fetal Status: Fetal Heart Rate (bpm): 150 Fundal Height: 27 cm Movement: Present     General:  Alert, oriented and cooperative. Patient is in no acute distress.  Skin: Skin is warm and dry. No rash noted.   Cardiovascular: Normal heart rate noted  Respiratory: Normal respiratory effort, no problems with respiration noted  Abdomen: Soft, gravid, appropriate for gestational age.  Pain/Pressure: Absent     Pelvic: Cervical exam deferred        Extremities: Normal range of motion.     Mental Status: Normal mood and affect. Normal behavior. Normal judgment and thought content.   Assessment and Plan:  Pregnancy: [id number] at [redacted]w[redacted]d 1. Supervision of high risk pregnancy, antepartum Patient is doing well without complaints   2. Diet controlled gestational diabetes mellitus (GDM), antepartum Patient did not  bring log but reports highest fasting as high as 92 and highest pp of 130 following a meal with a soda. Patient reports most values range from 110-120 Patient had fetal echo today- awaiting report Patient scheduled for growth ultrasound on 3/6  3. GBS bacteriuria Prophylaxis in labor  4. Maternal obesity syndrome, antepartum COntinue ASA  5. Hx of preeclampsia, prior pregnancy, currently pregnant   6. History of intrauterine fetal death at 6 weeks, currently pregnant   7. Non-English speaking patient Arabic interpreter present  Preterm labor symptoms and general obstetric precautions including but not limited to vaginal bleeding, contractions, leaking of fluid and fetal movement were reviewed in detail with the patient. Please refer to After Visit Summary for other counseling recommendations.   No follow-ups on file.  Future Appointments  Date Time Provider [place]  01/08/2022  9:00 AM [facility] NURSE [facility]  01/08/2022  9:15 AM [facility] US2 [facility]    Mora Bellman, MD

## 2022-01-08 ENCOUNTER — Ambulatory Visit: Payer: Medicaid Other

## 2022-01-09 ENCOUNTER — Other Ambulatory Visit: Payer: Self-pay

## 2022-01-09 ENCOUNTER — Ambulatory Visit: Payer: Medicaid Other | Admitting: *Deleted

## 2022-01-09 ENCOUNTER — Ambulatory Visit: Payer: Medicaid Other | Attending: Maternal & Fetal Medicine

## 2022-01-09 VITALS — BP 112/67 | HR 96

## 2022-01-09 DIAGNOSIS — Z8759 Personal history of other complications of pregnancy, childbirth and the puerperium: Secondary | ICD-10-CM | POA: Diagnosis present

## 2022-01-09 DIAGNOSIS — R8271 Bacteriuria: Secondary | ICD-10-CM | POA: Insufficient documentation

## 2022-01-09 DIAGNOSIS — O09293 Supervision of pregnancy with other poor reproductive or obstetric history, third trimester: Secondary | ICD-10-CM | POA: Diagnosis not present

## 2022-01-09 DIAGNOSIS — O09299 Supervision of pregnancy with other poor reproductive or obstetric history, unspecified trimester: Secondary | ICD-10-CM | POA: Insufficient documentation

## 2022-01-09 DIAGNOSIS — O99210 Obesity complicating pregnancy, unspecified trimester: Secondary | ICD-10-CM | POA: Insufficient documentation

## 2022-01-09 DIAGNOSIS — O99212 Obesity complicating pregnancy, second trimester: Secondary | ICD-10-CM | POA: Diagnosis present

## 2022-01-09 DIAGNOSIS — O24410 Gestational diabetes mellitus in pregnancy, diet controlled: Secondary | ICD-10-CM

## 2022-01-09 DIAGNOSIS — Z3A28 28 weeks gestation of pregnancy: Secondary | ICD-10-CM

## 2022-01-09 DIAGNOSIS — O99213 Obesity complicating pregnancy, third trimester: Secondary | ICD-10-CM | POA: Diagnosis not present

## 2022-01-10 ENCOUNTER — Other Ambulatory Visit: Payer: Self-pay | Admitting: *Deleted

## 2022-01-10 DIAGNOSIS — O24410 Gestational diabetes mellitus in pregnancy, diet controlled: Secondary | ICD-10-CM

## 2022-01-10 DIAGNOSIS — Z8759 Personal history of other complications of pregnancy, childbirth and the puerperium: Secondary | ICD-10-CM

## 2022-01-10 DIAGNOSIS — O09299 Supervision of pregnancy with other poor reproductive or obstetric history, unspecified trimester: Secondary | ICD-10-CM

## 2022-01-15 ENCOUNTER — Ambulatory Visit (INDEPENDENT_AMBULATORY_CARE_PROVIDER_SITE_OTHER): Payer: Medicaid Other | Admitting: Obstetrics and Gynecology

## 2022-01-15 ENCOUNTER — Other Ambulatory Visit: Payer: Self-pay

## 2022-01-15 ENCOUNTER — Encounter: Payer: Self-pay | Admitting: Obstetrics and Gynecology

## 2022-01-15 VITALS — BP 130/82 | HR 99 | Wt 210.0 lb

## 2022-01-15 DIAGNOSIS — O09299 Supervision of pregnancy with other poor reproductive or obstetric history, unspecified trimester: Secondary | ICD-10-CM

## 2022-01-15 DIAGNOSIS — O24410 Gestational diabetes mellitus in pregnancy, diet controlled: Secondary | ICD-10-CM

## 2022-01-15 DIAGNOSIS — O0990 Supervision of high risk pregnancy, unspecified, unspecified trimester: Secondary | ICD-10-CM | POA: Diagnosis not present

## 2022-01-15 DIAGNOSIS — O99210 Obesity complicating pregnancy, unspecified trimester: Secondary | ICD-10-CM

## 2022-01-15 DIAGNOSIS — Z23 Encounter for immunization: Secondary | ICD-10-CM

## 2022-01-15 DIAGNOSIS — Z789 Other specified health status: Secondary | ICD-10-CM

## 2022-01-15 NOTE — Progress Notes (Signed)
? ?  PRENATAL VISIT NOTE ? ?Subjective:  ?Jacqueline Irwin is a 31 y.o. [phone] at [redacted]w[redacted]d being seen today for ongoing prenatal care.  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 ? ?Patient reports no complaints.  Contractions: Not present. Vag. Bleeding: None.  Movement: Present. Denies leaking of fluid.  ? ?The following portions of the patient's history were reviewed and updated as appropriate: allergies, current medications, past family history, past medical history, past social history, past surgical history and problem list.  ? ?Objective:  ? ?Vitals:  ? 01/15/22 1032  ?BP: 130/82  ?Pulse: 99  ?Weight: 210 lb (95.3 kg)  ? ? ?Fetal Status: Fetal Heart Rate (bpm): 154  Fundal Height: 28 cm Movement: Present    ? ?General:  Alert, oriented and cooperative. Patient is in no acute distress.  ?Skin: Skin is warm and dry. No rash noted.   ?Cardiovascular: Normal heart rate noted  ?Respiratory: Normal respiratory effort, no problems with respiration noted  ?Abdomen: Soft, gravid, appropriate for gestational age.  Pain/Pressure: Absent     ?Pelvic: Cervical exam deferred        ?Extremities: Normal range of motion.  Edema: None  ?Mental Status: Normal mood and affect. Normal behavior. Normal judgment and thought content.  ? ?Assessment and Plan:  ?Pregnancy: [id number] at [redacted]w[redacted]d ?1. Supervision of high risk pregnancy, antepartum ?Patient is doing well without complaints ?Third trimester labs next visit ? ?2. Diet controlled gestational diabetes  mellitus (GDM), antepartum ?CBGs not available for review but patient reports fasting as high as 93 and pp as high as 132 with most values in the 110's ?Follow up growth ultrasound as scheduled ?Advised patient to bring log to ensure that medication is not needed ? ?3. Hx of preeclampsia, prior pregnancy, currently pregnant ?Continue ASA ? ?4. Maternal obesity syndrome, antepartum ? ? ?5. Non-English speaking patient ?Arabic interpreter used during the encounter ? ?Preterm labor symptoms and general obstetric precautions including but not limited to vaginal bleeding, contractions, leaking of fluid and fetal movement were reviewed in detail with the patient. ?Please refer to After Visit Summary for other counseling recommendations.  ? ?Return in about 2 weeks (around 01/29/2022) for in person, ROB, High risk. ? ?Future Appointments  ?Date Time Provider [place]  ?02/07/2022  2:45 PM [facility] NURSE [facility]  ?02/07/2022  3:00 PM [facility] US1 [facility]  ?02/13/2022  2:45 PM [facility] NURSE [facility]  ?02/13/2022  3:00 PM [facility] US1 [facility]  ?02/19/2022  2:45 PM [facility] NURSE [facility]  ?02/19/2022  3:00 PM [facility] US1 [facility]  ? ? ?Mora Bellman, MD ? ?

## 2022-01-15 NOTE — Progress Notes (Signed)
Pt presents for ROB without complaints today. ?Pt did not bring CBG log - reports 130's after meals. ?Tdap given RD today without difficulty.  ? ?

## 2022-01-16 LAB — CBC
Hematocrit: 32.4 % — ABNORMAL LOW (ref 34.0–46.6)
Hemoglobin: 10.9 g/dL — ABNORMAL LOW (ref 11.1–15.9)
MCH: 25.6 pg — ABNORMAL LOW (ref 26.6–33.0)
MCHC: 33.6 g/dL (ref 31.5–35.7)
MCV: 76 fL — ABNORMAL LOW (ref 79–97)
Platelets: 238 10*3/uL (ref 150–450)
RBC: 4.25 x10E6/uL (ref 3.77–5.28)
RDW: 14.7 % (ref 11.7–15.4)
WBC: 8.5 10*3/uL (ref 3.4–10.8)

## 2022-01-16 LAB — RPR: RPR Ser Ql: NONREACTIVE

## 2022-01-16 LAB — HIV ANTIBODY (ROUTINE TESTING W REFLEX): HIV Screen 4th Generation wRfx: NONREACTIVE

## 2022-01-29 ENCOUNTER — Ambulatory Visit (INDEPENDENT_AMBULATORY_CARE_PROVIDER_SITE_OTHER): Payer: Medicaid Other | Admitting: Advanced Practice Midwife

## 2022-01-29 ENCOUNTER — Other Ambulatory Visit: Payer: Self-pay

## 2022-01-29 VITALS — BP 122/83 | HR 83 | Wt 211.8 lb

## 2022-01-29 DIAGNOSIS — O24410 Gestational diabetes mellitus in pregnancy, diet controlled: Secondary | ICD-10-CM

## 2022-01-29 DIAGNOSIS — R8271 Bacteriuria: Secondary | ICD-10-CM

## 2022-01-29 DIAGNOSIS — O24419 Gestational diabetes mellitus in pregnancy, unspecified control: Secondary | ICD-10-CM

## 2022-01-29 DIAGNOSIS — O99210 Obesity complicating pregnancy, unspecified trimester: Secondary | ICD-10-CM

## 2022-01-29 DIAGNOSIS — O09299 Supervision of pregnancy with other poor reproductive or obstetric history, unspecified trimester: Secondary | ICD-10-CM

## 2022-01-29 DIAGNOSIS — O0990 Supervision of high risk pregnancy, unspecified, unspecified trimester: Secondary | ICD-10-CM

## 2022-01-29 DIAGNOSIS — R12 Heartburn: Secondary | ICD-10-CM

## 2022-01-29 DIAGNOSIS — Z3A31 31 weeks gestation of pregnancy: Secondary | ICD-10-CM

## 2022-01-29 DIAGNOSIS — Z789 Other specified health status: Secondary | ICD-10-CM

## 2022-01-29 MED ORDER — ACCU-CHEK SOFTCLIX LANCETS MISC: 12 refills | Status: DC

## 2022-01-29 MED ORDER — OMEPRAZOLE 20 MG PO CPDR: 20.0000 mg | DELAYED_RELEASE_CAPSULE | Freq: Two times a day (BID) | ORAL | 3 refills | Status: DC

## 2022-01-29 MED ORDER — ACCU-CHEK GUIDE VI STRP: ORAL_STRIP | 12 refills | Status: DC

## 2022-01-29 MED ORDER — PREPLUS 27-1 MG PO TABS: 1.0000 | ORAL_TABLET | Freq: Every day | ORAL | 13 refills | Status: DC

## 2022-01-29 NOTE — Progress Notes (Signed)
Pt in office for ROB visit. She does not have any concerns today.  ?

## 2022-01-29 NOTE — Progress Notes (Signed)
? ?  PRENATAL VISIT NOTE ? ?Subjective:  ?Jacqueline Irwin is a 31 y.o. [phone] at [redacted]w[redacted]d being seen today for ongoing prenatal care.  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 ? ?Patient reports no complaints.  Contractions: Irritability. Vag. Bleeding: None.  Movement: Present. Denies leaking of fluid.  ? ?The following portions of the patient's history were reviewed and updated as appropriate: allergies, current medications, past family history, past medical history, past social history, past surgical history and problem list.  ? ?Objective:  ? ?Vitals:  ? 01/29/22 1454  ?BP: 122/83  ?Pulse: 83  ?Weight: 211 lb 12.8 oz (96.1 kg)  ? ? ?Fetal Status:     Movement: Present    ? ?General:  Alert, oriented and cooperative. Patient is in no acute distress.  ?Skin: Skin is warm and dry. No rash noted.   ?Cardiovascular: Normal heart rate noted  ?Respiratory: Normal respiratory effort, no problems with respiration noted  ?Abdomen: Soft, gravid, appropriate for gestational age.  Pain/Pressure: Absent     ?Pelvic: Cervical exam deferred        ?Extremities: Normal range of motion.  Edema: None  ?Mental Status: Normal mood and affect. Normal behavior. Normal judgment and thought content.  ? ?Assessment and Plan:  ?Pregnancy: [id number] at [redacted]w[redacted]d ?1. Supervision of high risk pregnancy, antepartum ?--Anticipatory guidance about next visits/weeks of pregnancy given.  ? ?2. Diet controlled gestational diabetes mellitus (GDM), antepartum ?--Fasting values  3 out of 14 values elevated, highest 98. PP values 4 out of 28 elevated, highest 125. ?--Continue diet control.  Recent values lower than usual as pt is fasting for Ramadan. She feels well so reassurance provided about lower than usual values.  ? ?3. History of intrauterine fetal death at 77 weeks, currently pregnant ? ? ?4. Language barrier affecting health care ?--Arabic interpreter used via video line ? ?5. [redacted] weeks gestation of pregnancy ? ? ?Preterm labor symptoms and general obstetric precautions including but not limited to vaginal bleeding, contractions, leaking of fluid and fetal movement were reviewed in detail with the patient. ?Please refer to After Visit Summary for other counseling recommendations.  ? ?No follow-ups on file. ? ?Future Appointments  ?Date Time Provider [place]  ?02/07/2022  2:45 PM [facility] NURSE [facility]  ?02/07/2022  3:00 PM [facility] US1 [facility]  ?02/13/2022  2:45 PM [facility] NURSE [facility]  ?02/13/2022  3:00 PM [facility] US1 [facility]  ?02/19/2022  2:45 PM [facility] NURSE [facility]  ?02/19/2022  3:00 PM [facility] US1 [facility]  ? ? ?Fatima Blank, CNM  ?

## 2022-02-05 ENCOUNTER — Ambulatory Visit: Payer: Medicaid Other

## 2022-02-07 ENCOUNTER — Ambulatory Visit: Payer: Medicaid Other | Admitting: *Deleted

## 2022-02-07 ENCOUNTER — Ambulatory Visit: Payer: Medicaid Other | Attending: Obstetrics

## 2022-02-07 ENCOUNTER — Other Ambulatory Visit: Payer: Self-pay | Admitting: Obstetrics

## 2022-02-07 ENCOUNTER — Ambulatory Visit (HOSPITAL_BASED_OUTPATIENT_CLINIC_OR_DEPARTMENT_OTHER): Payer: Medicaid Other | Admitting: Maternal & Fetal Medicine

## 2022-02-07 VITALS — BP 115/73 | HR 86

## 2022-02-07 DIAGNOSIS — O09293 Supervision of pregnancy with other poor reproductive or obstetric history, third trimester: Secondary | ICD-10-CM | POA: Diagnosis not present

## 2022-02-07 DIAGNOSIS — Z3A32 32 weeks gestation of pregnancy: Secondary | ICD-10-CM | POA: Diagnosis not present

## 2022-02-07 DIAGNOSIS — O99213 Obesity complicating pregnancy, third trimester: Secondary | ICD-10-CM | POA: Diagnosis not present

## 2022-02-07 DIAGNOSIS — O283 Abnormal ultrasonic finding on antenatal screening of mother: Secondary | ICD-10-CM | POA: Diagnosis present

## 2022-02-07 DIAGNOSIS — O99210 Obesity complicating pregnancy, unspecified trimester: Secondary | ICD-10-CM

## 2022-02-07 DIAGNOSIS — O24410 Gestational diabetes mellitus in pregnancy, diet controlled: Secondary | ICD-10-CM | POA: Insufficient documentation

## 2022-02-07 DIAGNOSIS — R8271 Bacteriuria: Secondary | ICD-10-CM | POA: Insufficient documentation

## 2022-02-07 DIAGNOSIS — O09299 Supervision of pregnancy with other poor reproductive or obstetric history, unspecified trimester: Secondary | ICD-10-CM | POA: Insufficient documentation

## 2022-02-07 DIAGNOSIS — Z8759 Personal history of other complications of pregnancy, childbirth and the puerperium: Secondary | ICD-10-CM | POA: Diagnosis not present

## 2022-02-07 NOTE — Procedures (Signed)
Jacqueline Irwin ?[birth year] ?104w2d ? ?Fetus A Non-Stress Test Interpretation for 02/07/22 ? ?Indication: Unsatisfactory BPP ? ?Fetal Heart Rate A ?Mode: External ?Baseline Rate (A): 145 bpm ?Variability: Moderate ?Accelerations: 15 x 15 ?Decelerations: None ?Multiple birth?: No ? ?Uterine Activity ?Mode: Toco ?Contraction Frequency (min): none ?Resting Tone Palpated: Relaxed ? ?Interpretation (Fetal Testing) ?Nonstress Test Interpretation: Reactive ?Overall Impression: Reassuring for gestational age ?Comments: tracing reviewed by Dr. Grace Bushy ? ? ?

## 2022-02-08 ENCOUNTER — Other Ambulatory Visit: Payer: Self-pay | Admitting: *Deleted

## 2022-02-08 DIAGNOSIS — O365930 Maternal care for other known or suspected poor fetal growth, third trimester, not applicable or unspecified: Secondary | ICD-10-CM

## 2022-02-08 DIAGNOSIS — O24419 Gestational diabetes mellitus in pregnancy, unspecified control: Secondary | ICD-10-CM

## 2022-02-12 ENCOUNTER — Ambulatory Visit: Payer: Medicaid Other

## 2022-02-13 ENCOUNTER — Ambulatory Visit: Payer: Medicaid Other | Attending: Obstetrics

## 2022-02-13 ENCOUNTER — Ambulatory Visit: Payer: Medicaid Other | Admitting: *Deleted

## 2022-02-13 VITALS — BP 113/76 | HR 106

## 2022-02-13 DIAGNOSIS — O09299 Supervision of pregnancy with other poor reproductive or obstetric history, unspecified trimester: Secondary | ICD-10-CM

## 2022-02-13 DIAGNOSIS — O24419 Gestational diabetes mellitus in pregnancy, unspecified control: Secondary | ICD-10-CM | POA: Diagnosis present

## 2022-02-13 DIAGNOSIS — O99210 Obesity complicating pregnancy, unspecified trimester: Secondary | ICD-10-CM | POA: Insufficient documentation

## 2022-02-13 DIAGNOSIS — R8271 Bacteriuria: Secondary | ICD-10-CM | POA: Insufficient documentation

## 2022-02-13 DIAGNOSIS — E669 Obesity, unspecified: Secondary | ICD-10-CM

## 2022-02-13 DIAGNOSIS — O24410 Gestational diabetes mellitus in pregnancy, diet controlled: Secondary | ICD-10-CM

## 2022-02-13 DIAGNOSIS — O99213 Obesity complicating pregnancy, third trimester: Secondary | ICD-10-CM | POA: Diagnosis not present

## 2022-02-13 DIAGNOSIS — O09293 Supervision of pregnancy with other poor reproductive or obstetric history, third trimester: Secondary | ICD-10-CM | POA: Diagnosis not present

## 2022-02-13 DIAGNOSIS — O365930 Maternal care for other known or suspected poor fetal growth, third trimester, not applicable or unspecified: Secondary | ICD-10-CM | POA: Diagnosis present

## 2022-02-13 DIAGNOSIS — Z3A33 33 weeks gestation of pregnancy: Secondary | ICD-10-CM

## 2022-02-13 DIAGNOSIS — Z8759 Personal history of other complications of pregnancy, childbirth and the puerperium: Secondary | ICD-10-CM | POA: Insufficient documentation

## 2022-02-14 ENCOUNTER — Other Ambulatory Visit: Payer: Self-pay | Admitting: *Deleted

## 2022-02-14 ENCOUNTER — Ambulatory Visit (INDEPENDENT_AMBULATORY_CARE_PROVIDER_SITE_OTHER): Payer: Medicaid Other | Admitting: Certified Nurse Midwife

## 2022-02-14 VITALS — BP 122/79 | HR 94 | Wt 210.8 lb

## 2022-02-14 DIAGNOSIS — Z3A33 33 weeks gestation of pregnancy: Secondary | ICD-10-CM

## 2022-02-14 DIAGNOSIS — O24419 Gestational diabetes mellitus in pregnancy, unspecified control: Secondary | ICD-10-CM

## 2022-02-14 DIAGNOSIS — O365931 Maternal care for other known or suspected poor fetal growth, third trimester, fetus 1: Secondary | ICD-10-CM

## 2022-02-14 DIAGNOSIS — O24410 Gestational diabetes mellitus in pregnancy, diet controlled: Secondary | ICD-10-CM

## 2022-02-14 DIAGNOSIS — O0993 Supervision of high risk pregnancy, unspecified, third trimester: Secondary | ICD-10-CM

## 2022-02-14 NOTE — Progress Notes (Signed)
? ?  PRENATAL VISIT NOTE ? ?Subjective:  ?Jacqueline Irwin is a 31 y.o. [phone] at [redacted]w[redacted]d being seen today for ongoing prenatal care.  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 ? ?Patient reports no complaints.  Contractions: Not present. Vag. Bleeding: None.  Movement: Present. Denies leaking of fluid.  ? ?The following portions of the patient's history were reviewed and updated as appropriate: allergies, current medications, past family history, past medical history, past social history, past surgical history and problem list.  ? ?Objective:  ? ?Vitals:  ? 02/14/22 1517  ?BP: 122/79  ?Pulse: 94  ?Weight: 210 lb 12.8 oz (95.6 kg)  ? ?Fetal Status: Fetal Heart Rate (bpm): 154 Fundal Height: 33 cm Movement: Present    ? ?General:  Alert, oriented and cooperative. Patient is in no acute distress.  ?Skin: Skin is warm and dry. No rash noted.   ?Cardiovascular: Normal heart rate noted  ?Respiratory: Normal respiratory effort, no problems with respiration noted  ?Abdomen: Soft, gravid, appropriate for gestational age.  Pain/Pressure: Absent     ?Pelvic: Cervical exam deferred        ?Extremities: Normal range of motion.  Edema: None  ?Mental Status: Normal mood and affect. Normal behavior. Normal judgment and thought content.  ? ?Assessment and Plan:  ?Pregnancy: [id number] at [redacted]w[redacted]d ?1. Supervision of high risk pregnancy in third trimester ?- Doing well, feeling regular and vigorous fetal movement  ? ?2. [redacted] weeks gestation of pregnancy ?- Routine  OB care  ? ?3. Diet controlled gestational diabetes mellitus (GDM) in third trimester ?- Did not bring log with her but says her fasting has not been higher than 93 (and usually lower), post-prandials have not been higher than 125 and that was infrequent.  ?- Explained the importance of bringing her glucose log for review and close glucose control for the safety of the pregnancy. Pt expressed understanding. ? ?Preterm labor symptoms and general obstetric precautions including but not limited to vaginal bleeding, contractions, leaking of fluid and fetal movement were reviewed in detail with the patient. ?Please refer to After Visit Summary for other counseling recommendations.  ? ?Return in about 2 weeks (around 02/28/2022) for IN-PERSON, LOB. ? ?Future Appointments  ?Date Time Provider [place]  ?02/21/2022  4:10 PM Renee Harder, CNM [facility] None  ?02/22/2022  9:45 AM [facility] NURSE [facility]  ?02/22/2022 10:00 AM [facility] US1 [facility]  ?02/28/2022  2:15 PM [facility] NURSE [facility]  ?02/28/2022  2:30 PM [facility] US3 [facility]  ?03/07/2022  3:30 PM [facility] NURSE [facility]  ?03/07/2022  3:45 PM [facility] US5 [facility]  ? ?Gabriel Carina, CNM ?

## 2022-02-19 ENCOUNTER — Ambulatory Visit: Payer: Medicaid Other

## 2022-02-21 ENCOUNTER — Ambulatory Visit (INDEPENDENT_AMBULATORY_CARE_PROVIDER_SITE_OTHER): Payer: Medicaid Other

## 2022-02-21 VITALS — BP 122/78 | HR 98 | Wt 212.0 lb

## 2022-02-21 DIAGNOSIS — O09299 Supervision of pregnancy with other poor reproductive or obstetric history, unspecified trimester: Secondary | ICD-10-CM

## 2022-02-21 DIAGNOSIS — O0990 Supervision of high risk pregnancy, unspecified, unspecified trimester: Secondary | ICD-10-CM

## 2022-02-21 DIAGNOSIS — O24410 Gestational diabetes mellitus in pregnancy, diet controlled: Secondary | ICD-10-CM

## 2022-02-21 DIAGNOSIS — Z3A34 34 weeks gestation of pregnancy: Secondary | ICD-10-CM

## 2022-02-21 NOTE — Progress Notes (Signed)
? ?  PRENATAL VISIT NOTE ? ?Subjective:  ?Jacqueline Irwin is a 31 y.o. [phone] at [redacted]w[redacted]d being seen today for ongoing prenatal care.  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 ? ?Patient reports no complaints.  Contractions: Not present. Vag. Bleeding: None.  Movement: Present. Denies leaking of fluid.  ? ?The following portions of the patient's history were reviewed and updated as appropriate: allergies, current medications, past family history, past medical history, past social history, past surgical history and problem list.  ? ?Objective:  ? ?Vitals:  ? 02/21/22 1608  ?BP: 122/78  ?Pulse: 98  ?Weight: 212 lb (96.2 kg)  ? ? ?Fetal Status: Fetal Heart Rate (bpm): 158 Fundal Height: 34 cm Movement: Present    ? ?General:  Alert, oriented and cooperative. Patient is in no acute distress.  ?Skin: Skin is warm and dry. No rash noted.   ?Cardiovascular: Normal heart rate noted  ?Respiratory: Normal respiratory effort, no problems with respiration noted  ?Abdomen: Soft, gravid, appropriate for gestational age.  Pain/Pressure: Absent     ?Pelvic: Cervical exam deferred        ?Extremities: Normal range of motion.  Edema: None  ?Mental Status: Normal mood and affect. Normal behavior. Normal judgment and thought content.  ? ?Assessment and Plan:  ?Pregnancy: [id number] at [redacted]w[redacted]d ?1. Supervision of high risk pregnancy, antepartum ?- Routine OB. Doing well, no concerns ?- Anticipatory guidance for upcoming appointments provided ? ?2. [redacted] weeks  gestation of pregnancy ?- Endorses active fetal movement ?- FH appropriate ? ?3. Diet controlled gestational diabetes mellitus (GDM) in third trimester ?- Reviewed log today, well controlled. Numbers all within range ? ?4. History of intrauterine fetal death at 13 weeks, currently pregnant ? ? ?Preterm labor symptoms and general obstetric precautions including but not limited to vaginal bleeding, contractions, leaking of fluid and fetal movement were reviewed in detail with the patient. ?Please refer to After Visit Summary for other counseling recommendations.  ? ?Return in about 1 week (around 02/28/2022). ? ?Future Appointments  ?Date Time Provider [place]  ?02/22/2022  9:45 AM [facility] NURSE [facility]  ?02/22/2022 10:00 AM [facility] US1 [facility]  ?02/28/2022  2:15 PM [facility] NURSE [facility]  ?02/28/2022  2:30 PM [facility] US3 [facility]  ?03/01/2022  2:50 PM Gavin Pound, CNM [facility] None  ?03/07/2022  3:30 PM [facility] NURSE [facility]  ?03/07/2022  3:45 PM [facility] US5 [facility]  ? ? ?Renee Harder, CNM ? ?

## 2022-02-22 ENCOUNTER — Ambulatory Visit: Payer: Medicaid Other | Attending: Obstetrics

## 2022-02-22 ENCOUNTER — Encounter: Payer: Self-pay | Admitting: *Deleted

## 2022-02-22 ENCOUNTER — Ambulatory Visit: Payer: Medicaid Other | Admitting: *Deleted

## 2022-02-22 VITALS — BP 118/85 | HR 78

## 2022-02-22 DIAGNOSIS — R8271 Bacteriuria: Secondary | ICD-10-CM | POA: Diagnosis present

## 2022-02-22 DIAGNOSIS — O09299 Supervision of pregnancy with other poor reproductive or obstetric history, unspecified trimester: Secondary | ICD-10-CM | POA: Diagnosis present

## 2022-02-22 DIAGNOSIS — O09293 Supervision of pregnancy with other poor reproductive or obstetric history, third trimester: Secondary | ICD-10-CM | POA: Diagnosis not present

## 2022-02-22 DIAGNOSIS — O365930 Maternal care for other known or suspected poor fetal growth, third trimester, not applicable or unspecified: Secondary | ICD-10-CM

## 2022-02-22 DIAGNOSIS — O24410 Gestational diabetes mellitus in pregnancy, diet controlled: Secondary | ICD-10-CM | POA: Diagnosis not present

## 2022-02-22 DIAGNOSIS — Z8759 Personal history of other complications of pregnancy, childbirth and the puerperium: Secondary | ICD-10-CM

## 2022-02-22 DIAGNOSIS — O24419 Gestational diabetes mellitus in pregnancy, unspecified control: Secondary | ICD-10-CM | POA: Diagnosis present

## 2022-02-22 DIAGNOSIS — E669 Obesity, unspecified: Secondary | ICD-10-CM

## 2022-02-22 DIAGNOSIS — O99210 Obesity complicating pregnancy, unspecified trimester: Secondary | ICD-10-CM | POA: Insufficient documentation

## 2022-02-22 DIAGNOSIS — O99213 Obesity complicating pregnancy, third trimester: Secondary | ICD-10-CM | POA: Diagnosis not present

## 2022-02-22 DIAGNOSIS — Z3A34 34 weeks gestation of pregnancy: Secondary | ICD-10-CM

## 2022-02-28 ENCOUNTER — Ambulatory Visit: Payer: Medicaid Other | Admitting: *Deleted

## 2022-02-28 ENCOUNTER — Ambulatory Visit: Payer: Medicaid Other | Attending: Maternal & Fetal Medicine

## 2022-02-28 VITALS — BP 117/77 | HR 91

## 2022-02-28 DIAGNOSIS — O99213 Obesity complicating pregnancy, third trimester: Secondary | ICD-10-CM

## 2022-02-28 DIAGNOSIS — O99210 Obesity complicating pregnancy, unspecified trimester: Secondary | ICD-10-CM | POA: Diagnosis present

## 2022-02-28 DIAGNOSIS — O09293 Supervision of pregnancy with other poor reproductive or obstetric history, third trimester: Secondary | ICD-10-CM

## 2022-02-28 DIAGNOSIS — Z3A35 35 weeks gestation of pregnancy: Secondary | ICD-10-CM

## 2022-02-28 DIAGNOSIS — O24419 Gestational diabetes mellitus in pregnancy, unspecified control: Secondary | ICD-10-CM | POA: Insufficient documentation

## 2022-02-28 DIAGNOSIS — O24410 Gestational diabetes mellitus in pregnancy, diet controlled: Secondary | ICD-10-CM

## 2022-02-28 DIAGNOSIS — R8271 Bacteriuria: Secondary | ICD-10-CM

## 2022-02-28 DIAGNOSIS — O365931 Maternal care for other known or suspected poor fetal growth, third trimester, fetus 1: Secondary | ICD-10-CM | POA: Diagnosis present

## 2022-02-28 DIAGNOSIS — O365930 Maternal care for other known or suspected poor fetal growth, third trimester, not applicable or unspecified: Secondary | ICD-10-CM | POA: Diagnosis not present

## 2022-02-28 DIAGNOSIS — O09299 Supervision of pregnancy with other poor reproductive or obstetric history, unspecified trimester: Secondary | ICD-10-CM | POA: Insufficient documentation

## 2022-02-28 DIAGNOSIS — E669 Obesity, unspecified: Secondary | ICD-10-CM

## 2022-03-07 ENCOUNTER — Ambulatory Visit: Payer: Medicaid Other | Attending: Maternal & Fetal Medicine

## 2022-03-07 ENCOUNTER — Encounter: Payer: Self-pay | Admitting: *Deleted

## 2022-03-07 ENCOUNTER — Ambulatory Visit: Payer: Medicaid Other | Admitting: *Deleted

## 2022-03-07 ENCOUNTER — Other Ambulatory Visit: Payer: Self-pay | Admitting: Maternal & Fetal Medicine

## 2022-03-07 VITALS — BP 123/82 | HR 75

## 2022-03-07 DIAGNOSIS — O09299 Supervision of pregnancy with other poor reproductive or obstetric history, unspecified trimester: Secondary | ICD-10-CM

## 2022-03-07 DIAGNOSIS — R8271 Bacteriuria: Secondary | ICD-10-CM

## 2022-03-07 DIAGNOSIS — O24410 Gestational diabetes mellitus in pregnancy, diet controlled: Secondary | ICD-10-CM

## 2022-03-07 DIAGNOSIS — Z3A36 36 weeks gestation of pregnancy: Secondary | ICD-10-CM

## 2022-03-07 DIAGNOSIS — O99213 Obesity complicating pregnancy, third trimester: Secondary | ICD-10-CM | POA: Diagnosis not present

## 2022-03-07 DIAGNOSIS — O24419 Gestational diabetes mellitus in pregnancy, unspecified control: Secondary | ICD-10-CM | POA: Diagnosis not present

## 2022-03-07 DIAGNOSIS — O365931 Maternal care for other known or suspected poor fetal growth, third trimester, fetus 1: Secondary | ICD-10-CM

## 2022-03-07 DIAGNOSIS — O99210 Obesity complicating pregnancy, unspecified trimester: Secondary | ICD-10-CM | POA: Diagnosis present

## 2022-03-07 DIAGNOSIS — E669 Obesity, unspecified: Secondary | ICD-10-CM

## 2022-03-07 DIAGNOSIS — O09293 Supervision of pregnancy with other poor reproductive or obstetric history, third trimester: Secondary | ICD-10-CM | POA: Diagnosis not present

## 2022-03-08 ENCOUNTER — Other Ambulatory Visit (HOSPITAL_COMMUNITY)
Admission: RE | Admit: 2022-03-08 | Discharge: 2022-03-08 | Disposition: A | Discharge status: Home / Self Care | Payer: Medicaid Other | Source: Ambulatory Visit

## 2022-03-08 ENCOUNTER — Ambulatory Visit (INDEPENDENT_AMBULATORY_CARE_PROVIDER_SITE_OTHER): Payer: Medicaid Other | Admitting: Obstetrics and Gynecology

## 2022-03-08 VITALS — BP 123/80 | HR 93 | Wt 215.0 lb

## 2022-03-08 DIAGNOSIS — O0990 Supervision of high risk pregnancy, unspecified, unspecified trimester: Secondary | ICD-10-CM

## 2022-03-08 DIAGNOSIS — K0889 Other specified disorders of teeth and supporting structures: Secondary | ICD-10-CM

## 2022-03-08 MED ORDER — ACETAMINOPHEN-CODEINE #3 300-30 MG PO TABS
1.0000 | ORAL_TABLET | ORAL | 0 refills | Status: DC | PRN
Start: 2022-03-08 — End: 2022-03-14

## 2022-03-08 NOTE — Progress Notes (Signed)
OB/GBS ?

## 2022-03-08 NOTE — Progress Notes (Signed)
? ?  PRENATAL VISIT NOTE ? ?Subjective:  ?Jacqueline Irwin is a 31 y.o. [phone] at [redacted]w[redacted]d being seen today for ongoing prenatal care.  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 ? ?Patient reports Right molar tooth pain. Has been off and on for a while, however now she is having to take tylenol for the pain. No swelling or redness.  Contractions: Not present. Vag. Bleeding: None.  Movement: Present. Denies leaking of fluid.  ? ?The following portions of the patient's history were reviewed and updated as appropriate: allergies, current medications, past family history, past medical history, past social history, past surgical history and problem list.  ? ?Objective:  ? ?Vitals:  ? 03/08/22 1503  ?BP: 123/80  ?Pulse: 93  ?Weight: 215 lb (97.5 kg)  ? ? ?Fetal Status: Fetal Heart Rate (bpm): 152   Movement: Present    ? ?General:  Alert, oriented and cooperative. Patient is in no acute distress.  ?Skin: Skin is warm and dry. No rash noted.   ?Cardiovascular: Normal heart rate noted  ?Respiratory: Normal respiratory effort, no problems with respiration noted  ?Abdomen: Soft, gravid, appropriate for gestational age.  Pain/Pressure: Present     ?Pelvic: Cervical exam deferred        ?Extremities: Normal range of motion.  Edema: Trace  ?Mental Status: Normal mood and affect. Normal behavior. Normal judgment and thought content.  ? ?Assessment and Plan:  ?Pregnancy: [id number] at [redacted]w[redacted]d ?1. Supervision of high risk pregnancy,  antepartum ? ?-Due to IUGR with low amniotic fluid and history of IUFD, delivery is recommended at around 37 weeks per MFM. ?Induction scheduled @ 37 weeks3 ?No BS log today. Reports all fastings and PP in normal range.  ?GBS collected today.  ? ?2. Tooth pain ? ?Dentists list given. Does not looked abscessed, no facial swelling.  Some minor gum swelling near right sided, upper molar.  ?Rx: tylenol 3 ? ?Term labor symptoms and general obstetric precautions including but not limited to vaginal bleeding, contractions, leaking of fluid and fetal movement were reviewed in detail with the patient. ?Please refer to After Visit Summary for other counseling recommendations.  ? ?No follow-ups on file. ? ?Future Appointments  ?Date Time Provider [place]  ?03/12/2022 12:00 AM [facility] SCHED ROOM [facility] None  ? ? ?Noni Saupe, NP  ?

## 2022-03-08 NOTE — Patient Instructions (Signed)
?  ?  Dental Resources   ?[place]   ?Dr. Remer Macho  Exam [phone]   ?[address]  Extraction $120 and up   ?[place], [place]  *full list of prices available*   ?[phone]   ?   ?[facility]  Exam [phone]   ?[address]  Exam w/ Xrays $380   ?[place], [place]  Xrays $68 and up   ?[phone]  Cleaning $101   ?Extraction $190 and up   ?   ?[facility]  Cleaning + Xray $344   ?[address]  Extraction- pt has to be seen first to give price   ?[place], [place]   ?[phone]   ?  ?[place]   ?Dr. Romeo Apple Turner/Dr. Richrd Humbles  Exam, Cleaning, Xray [phone]   ?[id number]  Extraction [phone]   ?[place]   ?[phone]   ?   ?[facility]  Cleaning $5   ?[address] $5   ?[place], [place] [address]  Call to get on waiting list   ?[phone] ext [phone]   ?  ?Dr. Fayrene Fearing [place]/Dr. Stephenie Acres   ?[address]  Xray [phone] Each   ?[place], [place] [address]  Extraction $200   ? ?Dr. Hoover Browns  Extraction $300 per tooth   ?[address]   ?[place], [place] [address]   ?[phone]   ?   ?Dr. Nuala Alpha  Cleaning $300   ?[phone] [address]  Extraction [phone]   ?[place], [place] [address]   ?[phone]   ?  ?[place]   ?[facility]  Emergency Exam $65   ?[address]  Cleaning & Exam $150   ?[place], [place] [address]  Extractions: Simple $180 Surgical $250   ?[phone]  Fillings $446-$286   ?  ? ? ?.cwh ?

## 2022-03-09 ENCOUNTER — Telehealth (HOSPITAL_COMMUNITY): Payer: Self-pay | Admitting: *Deleted

## 2022-03-09 ENCOUNTER — Encounter (HOSPITAL_COMMUNITY): Payer: Self-pay

## 2022-03-09 LAB — CERVICOVAGINAL ANCILLARY ONLY
Candida Glabrata: NEGATIVE
Candida Vaginitis: POSITIVE — AB
Chlamydia: NEGATIVE
Comment: NEGATIVE
Comment: NEGATIVE
Comment: NEGATIVE
Comment: NEGATIVE
Comment: NORMAL
Neisseria Gonorrhea: NEGATIVE
Trichomonas: NEGATIVE

## 2022-03-09 NOTE — Telephone Encounter (Signed)
[id number] interpreter number Preadmission screen  ?

## 2022-03-11 LAB — CULTURE, BETA STREP (GROUP B ONLY): Strep Gp B Culture: POSITIVE — AB

## 2022-03-12 ENCOUNTER — Inpatient Hospital Stay (HOSPITAL_COMMUNITY): Payer: Medicaid Other | Admitting: Anesthesiology

## 2022-03-12 ENCOUNTER — Encounter (HOSPITAL_COMMUNITY): Payer: Self-pay | Admitting: Obstetrics and Gynecology

## 2022-03-12 ENCOUNTER — Inpatient Hospital Stay (HOSPITAL_COMMUNITY): Payer: Medicaid Other

## 2022-03-12 ENCOUNTER — Inpatient Hospital Stay (HOSPITAL_COMMUNITY)
Admission: AD | Admit: 2022-03-12 | Discharge: 2022-03-14 | DRG: 807 | Disposition: A | Discharge status: Home / Self Care | Payer: Medicaid Other | Attending: Obstetrics and Gynecology | Admitting: Obstetrics and Gynecology

## 2022-03-12 ENCOUNTER — Other Ambulatory Visit: Payer: Self-pay

## 2022-03-12 DIAGNOSIS — O134 Gestational [pregnancy-induced] hypertension without significant proteinuria, complicating childbirth: Secondary | ICD-10-CM

## 2022-03-12 DIAGNOSIS — Z3A37 37 weeks gestation of pregnancy: Secondary | ICD-10-CM | POA: Diagnosis not present

## 2022-03-12 DIAGNOSIS — Z8632 Personal history of gestational diabetes: Secondary | ICD-10-CM | POA: Diagnosis present

## 2022-03-12 DIAGNOSIS — O24420 Gestational diabetes mellitus in childbirth, diet controlled: Secondary | ICD-10-CM

## 2022-03-12 DIAGNOSIS — O365930 Maternal care for other known or suspected poor fetal growth, third trimester, not applicable or unspecified: Principal | ICD-10-CM | POA: Diagnosis present

## 2022-03-12 DIAGNOSIS — O99824 Streptococcus B carrier state complicating childbirth: Secondary | ICD-10-CM | POA: Diagnosis present

## 2022-03-12 DIAGNOSIS — O09299 Supervision of pregnancy with other poor reproductive or obstetric history, unspecified trimester: Secondary | ICD-10-CM

## 2022-03-12 DIAGNOSIS — O24410 Gestational diabetes mellitus in pregnancy, diet controlled: Principal | ICD-10-CM

## 2022-03-12 DIAGNOSIS — O24419 Gestational diabetes mellitus in pregnancy, unspecified control: Secondary | ICD-10-CM | POA: Diagnosis present

## 2022-03-12 DIAGNOSIS — O0990 Supervision of high risk pregnancy, unspecified, unspecified trimester: Secondary | ICD-10-CM

## 2022-03-12 DIAGNOSIS — O99210 Obesity complicating pregnancy, unspecified trimester: Secondary | ICD-10-CM

## 2022-03-12 DIAGNOSIS — N90810 Female genital mutilation status, unspecified: Secondary | ICD-10-CM | POA: Diagnosis present

## 2022-03-12 DIAGNOSIS — O99820 Streptococcus B carrier state complicating pregnancy: Secondary | ICD-10-CM

## 2022-03-12 DIAGNOSIS — O3463 Maternal care for abnormality of vagina, third trimester: Secondary | ICD-10-CM | POA: Diagnosis not present

## 2022-03-12 DIAGNOSIS — O09293 Supervision of pregnancy with other poor reproductive or obstetric history, third trimester: Secondary | ICD-10-CM | POA: Diagnosis not present

## 2022-03-12 DIAGNOSIS — R8271 Bacteriuria: Secondary | ICD-10-CM | POA: Diagnosis present

## 2022-03-12 LAB — COMPREHENSIVE METABOLIC PANEL
ALT: 26 U/L (ref 0–44)
AST: 25 U/L (ref 15–41)
Albumin: 2.3 g/dL — ABNORMAL LOW (ref 3.5–5.0)
Alkaline Phosphatase: 124 U/L (ref 38–126)
Anion gap: 5 (ref 5–15)
BUN: <5 mg/dL — ABNORMAL LOW (ref 6–20)
CO2: 25 mmol/L (ref 22–32)
Calcium: 8.6 mg/dL — ABNORMAL LOW (ref 8.9–10.3)
Chloride: 108 mmol/L (ref 98–111)
Creatinine, Ser: 0.85 mg/dL (ref 0.44–1.00)
GFR, Estimated: >60 mL/min (ref >60)
Glucose, Bld: 88 mg/dL (ref 70–99)
Potassium: 4.1 mmol/L (ref 3.5–5.1)
Sodium: 138 mmol/L (ref 135–145)
Total Bilirubin: 0.3 mg/dL (ref 0.3–1.2)
Total Protein: 6.3 g/dL — ABNORMAL LOW (ref 6.5–8.1)

## 2022-03-12 LAB — RPR: RPR Ser Ql: NONREACTIVE

## 2022-03-12 LAB — GLUCOSE, CAPILLARY
Glucose-Capillary: 74 mg/dL (ref 70–99)
Glucose-Capillary: 81 mg/dL (ref 70–99)
Glucose-Capillary: 94 mg/dL (ref 70–99)
Glucose-Capillary: 104 mg/dL — ABNORMAL HIGH (ref 70–99)
Glucose-Capillary: 96 mg/dL (ref 70–99)
Glucose-Capillary: 88 mg/dL (ref 70–99)

## 2022-03-12 LAB — PROTEIN / CREATININE RATIO, URINE
Creatinine, Urine: 55.27 mg/dL
Protein Creatinine Ratio: 0.14 mg/mg{Cre} (ref 0.00–0.15)
Total Protein, Urine: 8 mg/dL

## 2022-03-12 LAB — TYPE AND SCREEN
ABO/RH(D): B POS
Antibody Screen: NEGATIVE

## 2022-03-12 LAB — CBC
HCT: 32.5 % — ABNORMAL LOW (ref 36.0–46.0)
Hemoglobin: 10.8 g/dL — ABNORMAL LOW (ref 12.0–15.0)
MCH: 26.2 pg (ref 26.0–34.0)
MCHC: 33.2 g/dL (ref 30.0–36.0)
MCV: 78.7 fL — ABNORMAL LOW (ref 80.0–100.0)
Platelets: 213 10*3/uL (ref 150–400)
RBC: 4.13 MIL/uL (ref 3.87–5.11)
RDW: 15.2 % (ref 11.5–15.5)
WBC: 10 10*3/uL (ref 4.0–10.5)
nRBC: 0 % (ref 0.0–0.2)

## 2022-03-12 MED ORDER — FENTANYL CITRATE (PF) 100 MCG/2ML IJ SOLN
INTRAMUSCULAR | Status: AC
Start: 2022-03-12 — End: 2022-03-12
  Filled 2022-03-12: qty 2

## 2022-03-12 MED ORDER — EPHEDRINE 5 MG/ML INJ: 10.0000 mg | INTRAVENOUS | Status: DC | PRN

## 2022-03-12 MED ORDER — PENICILLIN G POT IN DEXTROSE 60000 UNIT/ML IV SOLN
3.0000 10*6.[IU] | INTRAVENOUS | Status: DC
  Administered 2022-03-12 (×4): 3 10*6.[IU] via INTRAVENOUS
  Filled 2022-03-12 (×4): qty 50

## 2022-03-12 MED ORDER — DIPHENHYDRAMINE HCL 50 MG/ML IJ SOLN: 12.5000 mg | INTRAMUSCULAR | Status: DC | PRN

## 2022-03-12 MED ORDER — TERBUTALINE SULFATE 1 MG/ML IJ SOLN: 0.2500 mg | Freq: Once | INTRAMUSCULAR | Status: DC | PRN

## 2022-03-12 MED ORDER — OXYTOCIN-SODIUM CHLORIDE 30-0.9 UT/500ML-% IV SOLN
2.5000 [IU]/h | INTRAVENOUS | Status: DC
  Administered 2022-03-12: 2.5 [IU]/h via INTRAVENOUS
  Filled 2022-03-12: qty 500

## 2022-03-12 MED ORDER — MISOPROSTOL 50MCG HALF TABLET: 50.0000 ug | ORAL_TABLET | ORAL | Status: DC | PRN

## 2022-03-12 MED ORDER — LACTATED RINGERS AMNIOINFUSION: INTRAVENOUS | Status: DC

## 2022-03-12 MED ORDER — FENTANYL CITRATE (PF) 100 MCG/2ML IJ SOLN
100.0000 ug | INTRAMUSCULAR | Status: DC | PRN
  Administered 2022-03-12: 100 ug via INTRAVENOUS

## 2022-03-12 MED ORDER — OXYTOCIN-SODIUM CHLORIDE 30-0.9 UT/500ML-% IV SOLN
1.0000 m[IU]/min | INTRAVENOUS | Status: DC
  Administered 2022-03-12: 2 m[IU]/min via INTRAVENOUS

## 2022-03-12 MED ORDER — SODIUM CHLORIDE 0.9 % IV SOLN
5.0000 10*6.[IU] | Freq: Once | INTRAVENOUS | Status: AC
  Administered 2022-03-12: 5 10*6.[IU] via INTRAVENOUS
  Filled 2022-03-12: qty 5

## 2022-03-12 MED ORDER — ONDANSETRON HCL 4 MG/2ML IJ SOLN
4.0000 mg | Freq: Four times a day (QID) | INTRAMUSCULAR | Status: DC | PRN
  Administered 2022-03-12: 4 mg via INTRAVENOUS
  Filled 2022-03-12: qty 2

## 2022-03-12 MED ORDER — LACTATED RINGERS IV SOLN
500.0000 mL | INTRAVENOUS | Status: DC | PRN
  Administered 2022-03-12: 1000 mL via INTRAVENOUS

## 2022-03-12 MED ORDER — OXYCODONE-ACETAMINOPHEN 5-325 MG PO TABS: 1.0000 | ORAL_TABLET | ORAL | Status: DC | PRN

## 2022-03-12 MED ORDER — FENTANYL CITRATE (PF) 100 MCG/2ML IJ SOLN
50.0000 ug | Freq: Once | INTRAMUSCULAR | Status: AC
  Administered 2022-03-12: 50 ug via INTRAVENOUS

## 2022-03-12 MED ORDER — LACTATED RINGERS IV SOLN
500.0000 mL | Freq: Once | INTRAVENOUS | Status: AC
  Administered 2022-03-12: 500 mL via INTRAVENOUS

## 2022-03-12 MED ORDER — FENTANYL CITRATE (PF) 100 MCG/2ML IJ SOLN
INTRAMUSCULAR | Status: AC
  Filled 2022-03-12: qty 2

## 2022-03-12 MED ORDER — LIDOCAINE HCL (PF) 1 % IJ SOLN
INTRAMUSCULAR | Status: DC | PRN
  Administered 2022-03-12 (×2): 4 mL via EPIDURAL

## 2022-03-12 MED ORDER — PHENYLEPHRINE 80 MCG/ML (10ML) SYRINGE FOR IV PUSH (FOR BLOOD PRESSURE SUPPORT): 80.0000 ug | PREFILLED_SYRINGE | INTRAVENOUS | Status: DC | PRN

## 2022-03-12 MED ORDER — ACETAMINOPHEN 325 MG PO TABS: 650.0000 mg | ORAL_TABLET | ORAL | Status: DC | PRN

## 2022-03-12 MED ORDER — OXYTOCIN BOLUS FROM INFUSION
333.0000 mL | Freq: Once | INTRAVENOUS | Status: AC
  Administered 2022-03-12: 333 mL via INTRAVENOUS

## 2022-03-12 MED ORDER — OXYCODONE-ACETAMINOPHEN 5-325 MG PO TABS: 2.0000 | ORAL_TABLET | ORAL | Status: DC | PRN

## 2022-03-12 MED ORDER — SOD CITRATE-CITRIC ACID 500-334 MG/5ML PO SOLN: 30.0000 mL | ORAL | Status: DC | PRN

## 2022-03-12 MED ORDER — LACTATED RINGERS IV SOLN: INTRAVENOUS | Status: DC

## 2022-03-12 MED ORDER — FENTANYL-BUPIVACAINE-NACL 0.5-0.125-0.9 MG/250ML-% EP SOLN
12.0000 mL/h | EPIDURAL | Status: DC | PRN
  Administered 2022-03-12: 12 mL/h via EPIDURAL
  Filled 2022-03-12: qty 250

## 2022-03-12 MED ORDER — LIDOCAINE HCL (PF) 1 % IJ SOLN: 30.0000 mL | INTRAMUSCULAR | Status: DC | PRN

## 2022-03-12 NOTE — Anesthesia Preprocedure Evaluation (Signed)
Anesthesia Evaluation  ?Patient identified by MRN, date of birth, ID band ?Patient awake ? ? ? ?Reviewed: ?Allergy & Precautions, Patient's Chart, lab work & pertinent test results ? ?History of Anesthesia Complications ?Negative for: history of anesthetic complications ? ?Airway ?Mallampati: II ? ?TM Distance: >3 FB ?Neck ROM: Full ? ? ? Dental ?no notable dental hx. ? ?  ?Pulmonary ?neg pulmonary ROS,  ?  ?Pulmonary exam normal ? ? ? ? ? ? ? Cardiovascular ?hypertension, Normal cardiovascular exam ? ? ?  ?Neuro/Psych ?negative neurological ROS ? negative psych ROS  ? GI/Hepatic ?negative GI ROS, Neg liver ROS,   ?Endo/Other  ?diabetes, Gestational ? Renal/GU ?negative Renal ROS  ?negative genitourinary ?  ?Musculoskeletal ?negative musculoskeletal ROS ?(+)  ? Abdominal ?  ?Peds ? Hematology ?negative hematology ROS ?(+)   ?Anesthesia Other Findings ?Day of surgery medications reviewed with patient. ? Reproductive/Obstetrics ?(+) Pregnancy ? ?  ? ? ? ? ? ? ? ? ? ? ? ? ? ?  ?  ? ? ? ? ? ? ? ? ?Anesthesia Physical ?Anesthesia Plan ? ?ASA: 2 ? ?Anesthesia Plan: Epidural  ? ?Post-op Pain Management:   ? ?Induction:  ? ?PONV Risk Score and Plan: Treatment may vary due to age or medical condition ? ?Airway Management Planned: Natural Airway ? ?Additional Equipment: Fetal Monitoring ? ?Intra-op Plan:  ? ?Post-operative Plan:  ? ?Informed Consent: I have reviewed the patients History and Physical, chart, labs and discussed the procedure including the risks, benefits and alternatives for the proposed anesthesia with the patient or authorized representative who has indicated his/her understanding and acceptance.  ? ? ? ?Interpreter used for interveiw ? ?Plan Discussed with:  ? ?Anesthesia Plan Comments:   ? ? ? ? ? ? ?Anesthesia Quick Evaluation ? ?

## 2022-03-12 NOTE — Discharge Summary (Signed)
? ?  Postpartum Discharge Summary ? ?Date of Service updated5/10/23 ? ?   ?Patient Name: Jacqueline Irwin ?DOB: 10/21/1991 ?MRN: [id number] ? ?Date of admission: 03/12/2022 ?Delivery date:03/12/2022  ?Delivering provider: Wende Mott  ?Date of discharge: 03/14/2022 ? ?Admitting diagnosis: Normal labor [O80, Z37.9] ?Intrauterine pregnancy: [redacted]w[redacted]d    ?Secondary diagnosis:  Active Problems: ?  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  Supervision of high risk pregnancy, antepartum ?  Gestational diabetes mellitus, antepartum ?  GBS bacteriuria ?  Normal labor ? ?Additional problems: FGR, Elevated S/D ratio, poorly monitored A1DM; Hx IUFD   ?Discharge diagnosis: Term Pregnancy Delivered, Gestational Hypertension, and GDM A1                                              ?Post partum procedures: none ?Augmentation: AROM and Pitocin ?Complications: None ? ?Hospital course: Induction of Labor With Vaginal Delivery   ?31y.o. yo [id number] 360w0das admitted to the hospital 03/12/2022 for induction of labor.  Indication for induction:  hx IUFD, A1GDM, gestational hypertension .  Patient had an uncomplicated labor course as follows: ?Membrane Rupture Time/Date: 11:46 AM ,03/12/2022   ?Delivery Method:Vaginal, Spontaneous  ?Episiotomy: None  ?Lacerations:  None  ?Details of delivery can be found in separate delivery note. BP elevated, denies Irwin, visual changes, ruq/epigastric pain, n/v.  will start procardia 3028maily and lasix 53m70mily (x5d). Reports dental pain Rt side both top and bottom-will start PCN until she can get into dentis. Rt leg heavy feeling when walking- but walks w/o difficulty- pt states she did notify anesthesia when they rounded and they said it would go away. Will wait until later this evening for d/c to make sure bp is responding well to meds.  Patient is discharged home 03/14/22. ? ?Newborn Data: ?Birth date:03/12/2022  ?Birth time:8:21 PM  ?Gender:Female  ?Living status:Living  ?Apgars:9 ,9   ?Weight:2310 g  ? ?Magnesium Sulfate received: No ?BMZ received: No ?Rhophylac:N/A ?MMR:N/A ?T-DaP:Given prenatally ?Flu: N/A ?Transfusion:No ? ?Physical exam  ?Vitals:  ? 03/13/22 1304 03/13/22 1700 03/13/22 2116 03/14/22 0630  ?BP: (!) 120/93 117/78 (!) 119/93 (!) 142/96  ?Pulse: 80  79 76  ?Resp: 18  18 16   ?Temp: 98.2 ?F (36.8 ?C)  99.1 ?F (37.3 ?C) 98.1 ?F (36.7 ?C)  ?TempSrc:   Oral Oral  ?SpO2:   100% 100%  ?Weight:      ?Height:      ? ?General: alert, cooperative, and no distress ?Mouth: no obvious dental abscess ?Lochia: appropriate ?Uterine Fundus: firm ?Incision: N/A ?DVT Evaluation: No evidence of DVT seen on physical exam. ?Negative Homan's sign. ?No cords or calf tenderness. ?No significant calf/ankle edema. ?Labs: ?Lab Results  ?Component Value Date  ? WBC 12.7 (H) 03/13/2022  ? HGB 10.3 (L) 03/13/2022  ? HCT 32.5 (L) 03/13/2022  ? MCV 80.6 03/13/2022  ? PLT 193 03/13/2022  ? ? ?  Latest Ref Rng & Units 03/12/2022  ?  4:22 PM  ?CMP  ?Glucose 70 - 99 mg/dL 88    ?BUN 6 - 20 mg/dL <5    ?Creatinine 0.44 - 1.00 mg/dL 0.85    ?Sodium 135 - 145 mmol/L 138    ?Potassium 3.5 - 5.1 mmol/L 4.1    ?Chloride 98 - 111 mmol/L 108    ?CO2 22 - 32 mmol/L 25    ?  Calcium 8.9 - 10.3 mg/dL 8.6    ?Total Protein 6.5 - 8.1 g/dL 6.3    ?Total Bilirubin 0.3 - 1.2 mg/dL 0.3    ?Alkaline Phos 38 - 126 U/L 124    ?AST 15 - 41 U/L 25    ?ALT 0 - 44 U/L 26    ? ?Edinburgh Score: ? ?  03/13/2022  ? 12:32 AM  ?Jacqueline Irwin Postnatal Depression Scale Screening Tool  ?I have been able to laugh and see the funny side of things. 0  ?I have looked forward with enjoyment to things. 0  ?I have blamed myself unnecessarily when things went wrong. 0  ?I have been anxious or worried for no good reason. 0  ?I have felt scared or panicky for no good reason. 0  ?Things have been getting on top of me. 0  ?I have been so unhappy that I have had difficulty sleeping. 0  ?I have felt sad or miserable. 0  ?I have been so unhappy that I have been crying. 0   ?The thought of harming myself has occurred to me. 0  ?Edinburgh Postnatal Depression Scale Total 0  ? ? ? ?After visit meds:  ?Allergies as of 03/14/2022   ? ?   Reactions  ? Beef-derived Products   ? Chicken Protein   ? Fish-derived Products   ? Pork-derived Products   ? ?  ? ?  ?Medication List  ?  ? ?STOP taking these medications   ? ?Accu-Chek Guide test strip ?Generic drug: glucose blood ?  ?Accu-Chek Guide w/Device Kit ?  ?Accu-Chek Softclix Lancets lancets ?  ?acetaminophen-codeine 300-30 MG tablet ?Commonly known as: TYLENOL #3 ?  ?aspirin EC 81 MG tablet ?  ?Blood Pressure Cuff Misc ?  ?Gojji Weight Scale Misc ?  ?nystatin-triamcinolone ointment ?Commonly known as: MYCOLOG ?  ?omeprazole 20 MG capsule ?Commonly known as: PriLOSEC ?  ? ?  ? ?TAKE these medications   ? ?furosemide 20 MG tablet ?Commonly known as: LASIX ?Take 1 tablet (20 mg total) by mouth daily. ?  ?ibuprofen 600 MG tablet ?Commonly known as: ADVIL ?Take 1 tablet (600 mg total) by mouth every 6 (six) hours as needed for mild pain, moderate pain or cramping. ?  ?NIFEdipine 30 MG 24 hr tablet ?Commonly known as: ADALAT CC ?Take 1 tablet (30 mg total) by mouth daily. ?Start taking on: Mar 15, 2022 ?  ?penicillin v potassium 500 MG tablet ?Commonly known as: VEETID ?Take 1 tablet (500 mg total) by mouth 4 (four) times daily. ?  ?PrePLUS 27-1 MG Tabs ?Take 1 tablet by mouth daily. ?  ? ?  ? ? ? ?Discharge home in stable condition ?Infant Feeding: Breast ?Infant Disposition:home with mother ?Discharge instruction: per After Visit Summary and Postpartum booklet. ?Activity: Advance as tolerated. Pelvic rest for 6 weeks.  ?Diet: routine diet ?Future Appointments: ?Future Appointments  ?Date Time Provider [place]  ?03/19/2022  1:20 PM [facility] NURSE [facility] None  ?04/27/2022  9:15 AM [facility] LAB [facility] None  ?04/27/2022 10:55 AM Donnamae Jude, MD [facility] None  ? ?Follow up Visit: ? Follow-up Information   ? ? [place] Follow up.   ?Specialty: Obstetrics and Gynecology ?Why: as scheduled ?Contact information: ?[address] ?[place] ?[phone] ? ?  ?  ? ?  ?  ? ?  ? ? ?Please schedule this patient for Postpartum visit in: 4 weeks with the following provider:  Any provider ?In-Person ?For C/S patients schedule nurse incision check in weeks 2 weeks: no ?High risk pregnancy complicated by: G8UPJ, HTN, hx IUFD ?Delivery mode:  SVD ?Anticipated Birth Control:  IUD ?PP Procedures needed: BP check, 2Hr GTT  ? ?CNM Circumcision Counseling Progress Note ? ?Patient desires circumcision for her female infant.  Circumcision procedure details discussed, risks and benefits of procedure were also discussed.  These include but are not limited to: Benefits of circumcision in men include reduction in the rates of urinary tract infection (UTI), penile cancer, some sexually transmitted infections, penile inflammatory and retractile disorders, as well as easier hygiene.  Risks include bleeding , infection, injury of glans which may lead to penile deformity or urinary tract issues, unsatisfactory cosmetic appearance and other potential complications related to the procedure.  It was emphasized that this is an elective procedure.  Patient wants to proceed with circumcision; written informed consent will be obtained.  Will have MD do circumcision when infant is cleared for such by peds. ? ?Knute Neu, CNM, [facility] ?03/14/2022   7:16 AM  ? ? ? ?

## 2022-03-12 NOTE — H&P (Signed)
OBSTETRIC ADMISSION HISTORY AND PHYSICAL ? ?Jacqueline Irwin Jacqueline Irwin is a 31 y.o. female [phone] with IUP at 42w0dby LMP presenting for IOL for FGR (7%ile), low normal AFI (5.2 cm), elevated S/D doppler 3.51, and A1GDM. She reports +FMs, No LOF, no VB, no blurry vision, headaches or peripheral edema, and RUQ pain.  She plans on breast feeding. She requests outpatient IUD for birth control. ?[facility] ? ?Dating: By LMP --->  Estimated Date of Delivery: 04/02/22 ? ?Sono:   ?_0 , CWD, normal anatomy, cephalic presentation,  [id number] 7% EFW ? ? ?Prenatal History/Complications:  ?-FGR at 7%, elevated S/D ratio (3.51), no signs of absent or reversed end- diastolic flow ?-AB2WUX poorly monitored ?-hx of IUFD ?-Low normal AFI ?- FGC Type II  ? ?Past Medical History: ?Past Medical History:  ?Diagnosis Date  ? History of stillbirth 08/25/2018  ? @ 8 months, due to preeclampsia  ? Lichen sclerosus 032/44/0102 ? Preeclampsia   ? first pregnancy  ? ? ?Past Surgical History: ?Past Surgical History:  ?Procedure Laterality Date  ? abcess of vulva    ? APPENDECTOMY    ? ? ?Obstetrical History: ?OB History   ? ? Gravida  ?6  ? Para  ?4  ? Term  ?3  ? Preterm  ?1  ? AB  ?1  ? Living  ?3  ?  ? ? SAB  ?1  ? IAB  ?   ? Ectopic  ?   ? Multiple  ?0  ? Live Births  ?3  ?   ?  ?  ? ? ?Social History ?Social History  ? ?Socioeconomic History  ? Marital status: Married  ?  Spouse name: Not on file  ? Number of children: Not on file  ? Years of education: Not on file  ? Highest education level: Not on file  ?Occupational History  ? Not on file  ?Tobacco Use  ? Smoking status: Never  ? Smokeless tobacco: Never  ?Vaping Use  ? Vaping Use: Never used  ?Substance and Sexual Activity  ? Alcohol use: No  ? Drug use: No  ? Sexual activity: Yes  ?Other Topics Concern  ? Not on file  ?Social History Narrative  ? Not on file  ? ?Social Determinants of Health  ? ?Financial Resource Strain: Not on file  ?Food Insecurity: Not  on file  ?Transportation Needs: Not on file  ?Physical Activity: Not on file  ?Stress: Not on file  ?Social Connections: Not on file  ? ? ?Family History: ?Family History  ?Problem Relation Age of Onset  ? Diabetes Mother   ? Diabetes Paternal Grandmother   ? Hypertension Paternal Grandmother   ? ? ?Allergies: ?No Known Allergies ? ?Pt denies allergies to latex, iodine, or shellfish. ? ?Medications Prior to Admission  ?Medication Sig Dispense Refill Last Dose  ? Prenatal Vit-Fe Fumarate-FA (PREPLUS) 27-1 MG TABS Take 1 tablet by mouth daily. 30 tablet 13 03/11/2022  ? Accu-Chek Softclix Lancets lancets Use as instructed (Patient not taking: Reported on 02/14/2022) 100 each 12   ? acetaminophen-codeine (TYLENOL #3) 300-30 MG tablet Take 1-2 tablets by mouth every 4 (four) hours as needed for moderate pain. 20 tablet 0   ? aspirin EC 81 MG tablet Take 1 tablet (81 mg total) by mouth daily. Take after 12 weeks for prevention of preeclampsia later in pregnancy 300 tablet 2   ? Blood Glucose Monitoring Suppl (ACCU-CHEK GUIDE) w/Device KIT 1  Device by Does not apply route 4 (four) times daily. (Patient not taking: Reported on 02/14/2022) 1 kit 0   ? Blood Pressure Monitoring (BLOOD PRESSURE CUFF) MISC 1 Device by Does not apply route once a week. (Patient not taking: Reported on 02/14/2022) 1 each 0   ? glucose blood (ACCU-CHEK GUIDE) test strip Use to check blood sugars four times a day was instructed (Patient not taking: Reported on 02/14/2022) 50 each 12   ? Misc. Devices (GOJJI WEIGHT SCALE) MISC 1 Units by Does not apply route as directed. (Patient not taking: Reported on 02/14/2022) 1 each 0   ? nystatin-triamcinolone ointment (MYCOLOG) Apply 1 application topically 2 (two) times daily. (Patient not taking: Reported on 02/14/2022) 30 g 0   ? omeprazole (PRILOSEC) 20 MG capsule Take 1 capsule (20 mg total) by mouth 2 (two) times daily before a meal. 60 capsule 3   ? ? ? ?Review of Systems  ? ?All systems reviewed and  negative except as stated in HPI ? ?Blood pressure 121/78, pulse 68, temperature 97.8 ?F (36.6 ?C), temperature source Oral, resp. rate 18, height _0  (1.626 m), weight 98.4 kg, last menstrual period 06/26/2021, currently breastfeeding. ?General appearance: alert, no apparent distress ?Lungs: clear to auscultation bilaterally ?Heart: regular rate and rhythm ?Abdomen: soft, non-tender; bowel sounds normal ?Extremities: Homans sign is negative, no sign of DVT ?Presentation: cephalic ?Fetal monitoringBaseline: 135 bpm, Variability: Good {> 6 bpm), Accelerations: Reactive, and Decelerations: Absent ?Uterine activity irregular ?Dilation: 1 ?Effacement (%): Thick ?Station: -3 ?Exam by:: Renard Matter, MD ? ? ?Prenatal labs: ?ABO, Rh: --/--/B POS (05/08 0030) ?Antibody: NEG (05/08 0030) ?Rubella: 9.39 (11/16 0903) ?RPR: Non Reactive (03/13 1107)  ?HBsAg: Negative (11/16 0903)  ?HIV: Non Reactive (03/13 1107)  ?GBS: Positive/-- (05/04 1551)  ?2 hr Glucola: Abnormal ?Genetic screening:  Negative, LR NIPS ?Anatomy US: incomplete ? ?Prenatal Transfer Tool  ?Maternal Diabetes: Yes:  Diabetes Type:  Diet controlled ?Genetic Screening: Normal ?Maternal Ultrasounds/Referrals: Normal ?Fetal Ultrasounds or other Referrals:  None ?Maternal Substance Abuse:  No ?Significant Maternal Medications:  None ?Significant Maternal Lab Results: Group B Strep positive ? ?Results for orders placed or performed during the hospital encounter of 03/12/22 (from the past 24 hour(s))  ?CBC  ? Collection Time: 03/12/22 12:30 AM  ?Result Value Ref Range  ? WBC 10.0 4.0 - 10.5 K/uL  ? RBC 4.13 3.87 - 5.11 MIL/uL  ? Hemoglobin 10.8 (L) 12.0 - 15.0 g/dL  ? HCT 32.5 (L) 36.0 - 46.0 %  ? MCV 78.7 (L) 80.0 - 100.0 fL  ? MCH 26.2 26.0 - 34.0 pg  ? MCHC 33.2 30.0 - 36.0 g/dL  ? RDW 15.2 11.5 - 15.5 %  ? Platelets 213 150 - 400 K/uL  ? nRBC 0.0 0.0 - 0.2 %  ?Type and screen [place]  ? Collection Time: 03/12/22 12:30 AM  ?Result Value Ref Range   ? ABO/RH(D) B POS   ? Antibody Screen NEG   ? Sample Expiration    ?  03/15/2022,2359 ?Performed at [facility], [place] [address], [place], [place] [address] ?  ?Glucose, capillary  ? Collection Time: 03/12/22  1:28 AM  ?Result Value Ref Range  ? Glucose-Capillary 74 70 - 99 mg/dL  ? ? ?Patient Active Problem List  ? Diagnosis Date Noted  ? Normal labor 03/12/2022  ? Tooth pain 03/08/2022  ? GBS bacteriuria 12/18/2021  ? Gestational diabetes mellitus, antepartum 10/24/2021  ? Maternal obesity syndrome,  antepartum 10/19/2021  ? Prediabetes 09/22/2021  ? History of intrauterine fetal death at 17 weeks, currently pregnant 09/20/2021  ? Hx of preeclampsia, prior pregnancy, currently pregnant 09/20/2021  ? Supervision of high risk pregnancy, antepartum 09/13/2021  ? BMI 34.0-34.9,adult 07/18/2020  ? Maternal varicella, non-immune 02/07/2017  ? Non-English speaking patient 02/06/2017  ? 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 ? ?Assessment/Plan:  ?Jacqueline Irwin is a 31 y.o. [phone] at 53w0dhere for for FGR (7%ile), low normal AFI (5.2 cm), elevated S/D doppler 3.51, and A1GDM ? ?#Labor: cervix 1 cm and thick and -3 at admission. Patient was given 1st dose of PCN (for GBS ppx)prior to starting IOL given multiparous status. Attempted to place cooks balloon and despite efforts with speculum guided placement and then placement attempt digitally unable to place. Fetal head extremely well applied to cervix and although could get balloon inside cervix, unable to push past internal os without balloon coming back out. Attempted to inflate balloon multiple times and balloon pushed out of cervix with inflation. Attempted for 45 minutes in different positions but due to discomfort and not being successful stopped. ? ? Given her FGR with elevated doppler, will avoid cytotec at this time and start with pitocin. If fetal status remains reassuring with contractions can consider stopping pitocin and trying  cytotec. Also can consider attempting balloon placement again if remains 1-2 cm after few hours of pitocin. ?#Pain: Plans epidural ?#FWB: Cat I ?#ID:  GBS positive > PCN ?#MOF: Breast feeding ?#MOC: outpatien

## 2022-03-12 NOTE — Progress Notes (Signed)
Jacqueline Irwin Jacqueline Irwin is a 31 y.o. [phone] at [redacted]w[redacted]d admitted for IOL for hx of IUFD.  ? ?Subjective: ?Patient is comfortable with epidural ? ?Objective: ?BP 117/84   Pulse 71   Temp 97.9 ?F (36.6 ?C) (Oral)   Resp 20   Ht 5\' 4"  (1.626 m)   Wt 98.4 kg   LMP 06/26/2021 (Exact Date)   BMI 37.23 kg/m?  ?No intake/output data recorded. ?Total I/O ?In: -  ?Out: [place] [Urine:675] ? ?FHT: 130 bpm, moderate variability, +10x10 accels, repetitive variable decels ?UC: Q 2-4 mins ?SVE:   Dilation: 4 ?Effacement (%): 50 ?Station: -2, -1 ?Exam by:: Bridgette Habermann, CNM ? ?Labs: ?Lab Results  ?Component Value Date  ? WBC 10.0 03/12/2022  ? HGB 10.8 (L) 03/12/2022  ? HCT 32.5 (L) 03/12/2022  ? MCV 78.7 (L) 03/12/2022  ? PLT 213 03/12/2022  ? ? ?Assessment / Plan: ?Jacqueline Irwin is a 31 y.o. [phone] at [redacted]w[redacted]d admitted for IOL d/t IUGR.  ? ?Labor: Cervix unchanged, IUPC placed and amnioinfusion started.  ?A1GDM: Q4H CBG's. Have been wnl. ?Fetal Wellbeing:  Cat II ?Pain Control:  Epidural ?I/D:   GBS pos > PCN ?Anticipated MOD:  NSVD ? ? ? ?Renee Harder, CNM ?03/12/2022, 5:10 PM ? ? ?

## 2022-03-12 NOTE — Anesthesia Procedure Notes (Signed)
Epidural ?Patient location during procedure: OB ?Start time: 03/12/2022 9:54 AM ?End time: 03/12/2022 9:57 AM ? ?Staffing ?Anesthesiologist: Kaylyn Layer, MD ?Performed: anesthesiologist  ? ?Preanesthetic Checklist ?Completed: patient identified, IV checked, risks and benefits discussed, monitors and equipment checked, pre-op evaluation and timeout performed ? ?Epidural ?Patient position: sitting ?Prep: DuraPrep and site prepped and draped ?Patient monitoring: continuous pulse ox, blood pressure and heart rate ?Approach: midline ?Location: L3-L4 ?Injection technique: LOR air ? ?Needle:  ?Needle type: Tuohy  ?Needle gauge: 17 G ?Needle length: 9 cm ?Needle insertion depth: 7 cm ?Catheter type: closed end flexible ?Catheter size: 19 Gauge ?Catheter at skin depth: 12 cm ?Test dose: negative and Other (1% lidocaine) ? ?Assessment ?Events: blood not aspirated, injection not painful, no injection resistance, no paresthesia and negative IV test ? ?Additional Notes ?Patient identified. Risks, benefits, and alternatives discussed with patient including but not limited to bleeding, infection, nerve damage, paralysis, failed block, incomplete pain control, headache, blood pressure changes, nausea, vomiting, reactions to medication, itching, and postpartum back pain. Confirmed with bedside nurse the patient's most recent platelet count. Confirmed with patient that they are not currently taking any anticoagulation, have any bleeding history, or any family history of bleeding disorders. Patient expressed understanding and wished to proceed. All questions were answered. Sterile technique was used throughout the entire procedure. Please see nursing notes for vital signs.  ? ?Crisp LOR on first pass. Test dose was given through epidural catheter and negative prior to continuing to dose epidural or start infusion. Warning signs of high block given to the patient including shortness of breath, tingling/numbness in hands, complete  motor block, or any concerning symptoms with instructions to call for help. Patient was given instructions on fall risk and not to get out of bed. All questions and concerns addressed with instructions to call with any issues or inadequate analgesia.  Reason for block:procedure for pain ? ? ? ?

## 2022-03-12 NOTE — Progress Notes (Signed)
Amel Ahmed Leathia Farnell is a 31 y.o. [phone] at [redacted]w[redacted]d admitted for IOL for hx of IUFD.  ? ?Subjective: ?Patient is comfortable with epidural ? ?Objective: ?BP 120/74   Pulse 64   Temp 98 ?F (36.7 ?C) (Oral)   Resp 16   Ht 5\' 4"  (1.626 m)   Wt 98.4 kg   LMP 06/26/2021 (Exact Date)   BMI 37.23 kg/m?  ?No intake/output data recorded. ?Total I/O ?In: -  ?Out: 675 [Urine:675] ? ?FHT: 125 bpm, moderate variability, +15x15 accels, occasional variable ?UC: Q 2-4 mins ?SVE:   Dilation: 4 ?Effacement (%): 50 ?Station: -2 ?Exam by:: [email or web address], CNM ? ?Labs: ?Lab Results  ?Component Value Date  ? WBC 10.0 03/12/2022  ? HGB 10.8 (L) 03/12/2022  ? HCT 32.5 (L) 03/12/2022  ? MCV 78.7 (L) 03/12/2022  ? PLT 213 03/12/2022  ? ? ?Assessment / Plan: ?Bryella A. Maxim is a 31 y.o. 34 at [redacted]w[redacted]d admitted for IOL d/t IUGR.  ? ?Labor: Progressing. Continue Pitocin titration prn.  ?A1GDM: Q4H CBG's. Have been wnl. ?Fetal Wellbeing:  Cat 1 currently. Has occasional variable deceleration ?Pain Control:  Epidural ?I/D:   GBS pos > PCN ?Anticipated MOD:  NSVD ? ? ?[redacted]w[redacted]d, CNM ?03/12/2022, 2:22 PM ? ? ?

## 2022-03-12 NOTE — Progress Notes (Signed)
Jacqueline Irwin Jacqueline Irwin is a 31 y.o. [phone] at [redacted]w[redacted]d admitted for IOL for hx of IUFD.  ? ?Subjective: ?Patient is reporting more painful contractions, every 2-3 minutes. She is requesting epidural at this time.  ? ?Objective: ?BP (!) 136/97   Pulse 69   Temp 98 ?F (36.7 ?C) (Oral)   Resp 18   Ht 5\' 4"  (1.626 m)   Wt 98.4 kg   LMP 06/26/2021 (Exact Date)   BMI 37.23 kg/m?  ?No intake/output data recorded. ?No intake/output data recorded. ? ?FHT: 130 bpm, moderate variability, +15x15 accels, no decels ?UC: Q 2-4 mins ?SVE:   Dilation: 2 ?Effacement (%): Thick ?Station: -3 ?Exam by:: [email or web address], CNM ? ?Labs: ?Lab Results  ?Component Value Date  ? WBC 10.0 03/12/2022  ? HGB 10.8 (L) 03/12/2022  ? HCT 32.5 (L) 03/12/2022  ? MCV 78.7 (L) 03/12/2022  ? PLT 213 03/12/2022  ? ? ?Assessment / Plan: ?Jacqueline Irwin is a 31 y.o. 34 at [redacted]w[redacted]d admitted for IOL d/t hx IUGR.  ? ?Labor: Currently on Pitocin. Discussed R/B/A of AROM. Patient gives verbal consent. Patient desires to get epidural and then will AROM. Continue Pitocin titration prn per unit policy.  ?A1GDM: Q4H CBG's. Have been wnl. ?Fetal Wellbeing:  Category I ?Pain Control:  Epidural ?I/D:   GBS pos > PCN ?Anticipated MOD:  NSVD ? ? ?[redacted]w[redacted]d, CNM ?03/12/2022, 9:06 AM ? ? ?

## 2022-03-12 NOTE — Plan of Care (Signed)
?  Problem: Education: Goal: Knowledge of General Education information will improve Description: Including pain rating scale, medication(s)/side effects and non-pharmacologic comfort measures Outcome: Completed/Met   Problem: Clinical Measurements: Goal: Ability to maintain clinical measurements within normal limits will improve Outcome: Completed/Met Goal: Will remain free from infection Outcome: Completed/Met   

## 2022-03-12 NOTE — Progress Notes (Signed)
CBG 74  

## 2022-03-13 LAB — CBC
HCT: 32.5 % — ABNORMAL LOW (ref 36.0–46.0)
Hemoglobin: 10.3 g/dL — ABNORMAL LOW (ref 12.0–15.0)
MCH: 25.6 pg — ABNORMAL LOW (ref 26.0–34.0)
MCHC: 31.7 g/dL (ref 30.0–36.0)
MCV: 80.6 fL (ref 80.0–100.0)
Platelets: 193 10*3/uL (ref 150–400)
RBC: 4.03 MIL/uL (ref 3.87–5.11)
RDW: 15.4 % (ref 11.5–15.5)
WBC: 12.7 10*3/uL — ABNORMAL HIGH (ref 4.0–10.5)
nRBC: 0 % (ref 0.0–0.2)

## 2022-03-13 MED ORDER — IBUPROFEN 600 MG PO TABS
600.0000 mg | ORAL_TABLET | Freq: Four times a day (QID) | ORAL | Status: DC
  Administered 2022-03-13 – 2022-03-14 (×6): 600 mg via ORAL
  Filled 2022-03-13 (×6): qty 1

## 2022-03-13 MED ORDER — SENNOSIDES-DOCUSATE SODIUM 8.6-50 MG PO TABS
2.0000 | ORAL_TABLET | Freq: Every day | ORAL | Status: DC
  Administered 2022-03-13 – 2022-03-14 (×2): 2 via ORAL
  Filled 2022-03-13 (×2): qty 2

## 2022-03-13 MED ORDER — COCONUT OIL OIL: 1.0000 "application " | TOPICAL_OIL | Status: DC | PRN

## 2022-03-13 MED ORDER — METHYLERGONOVINE MALEATE 0.2 MG PO TABS: 0.2000 mg | ORAL_TABLET | ORAL | Status: DC | PRN

## 2022-03-13 MED ORDER — TETANUS-DIPHTH-ACELL PERTUSSIS 5-2.5-18.5 LF-MCG/0.5 IM SUSY: 0.5000 mL | PREFILLED_SYRINGE | Freq: Once | INTRAMUSCULAR | Status: DC

## 2022-03-13 MED ORDER — MEDROXYPROGESTERONE ACETATE 150 MG/ML IM SUSP
150.0000 mg | INTRAMUSCULAR | Status: DC | PRN
Start: 2022-03-13 — End: 2022-03-14

## 2022-03-13 MED ORDER — ONDANSETRON HCL 4 MG/2ML IJ SOLN: 4.0000 mg | INTRAMUSCULAR | Status: DC | PRN

## 2022-03-13 MED ORDER — WITCH HAZEL-GLYCERIN EX PADS: 1.0000 "application " | MEDICATED_PAD | CUTANEOUS | Status: DC | PRN

## 2022-03-13 MED ORDER — METHYLERGONOVINE MALEATE 0.2 MG/ML IJ SOLN: 0.2000 mg | INTRAMUSCULAR | Status: DC | PRN

## 2022-03-13 MED ORDER — ACETAMINOPHEN 325 MG PO TABS
650.0000 mg | ORAL_TABLET | ORAL | Status: DC | PRN
  Administered 2022-03-13 – 2022-03-14 (×4): 650 mg via ORAL
  Filled 2022-03-13 (×4): qty 2

## 2022-03-13 MED ORDER — FERROUS SULFATE 325 (65 FE) MG PO TABS
325.0000 mg | ORAL_TABLET | ORAL | Status: DC
  Administered 2022-03-13: 325 mg via ORAL
  Filled 2022-03-13: qty 1

## 2022-03-13 MED ORDER — ONDANSETRON HCL 4 MG PO TABS: 4.0000 mg | ORAL_TABLET | ORAL | Status: DC | PRN

## 2022-03-13 MED ORDER — DOCUSATE SODIUM 100 MG PO CAPS
100.0000 mg | ORAL_CAPSULE | Freq: Two times a day (BID) | ORAL | Status: DC
  Administered 2022-03-13 – 2022-03-14 (×4): 100 mg via ORAL
  Filled 2022-03-13 (×4): qty 1

## 2022-03-13 MED ORDER — DIPHENHYDRAMINE HCL 25 MG PO CAPS: 25.0000 mg | ORAL_CAPSULE | Freq: Four times a day (QID) | ORAL | Status: DC | PRN

## 2022-03-13 MED ORDER — PRENATAL MULTIVITAMIN CH
1.0000 | ORAL_TABLET | Freq: Every day | ORAL | Status: DC
  Administered 2022-03-14: 1 via ORAL
  Filled 2022-03-13: qty 1

## 2022-03-13 MED ORDER — DIBUCAINE (PERIANAL) 1 % EX OINT: 1.0000 "application " | TOPICAL_OINTMENT | CUTANEOUS | Status: DC | PRN

## 2022-03-13 MED ORDER — SIMETHICONE 80 MG PO CHEW: 80.0000 mg | CHEWABLE_TABLET | ORAL | Status: DC | PRN

## 2022-03-13 MED ORDER — BENZOCAINE-MENTHOL 20-0.5 % EX AERO: 1.0000 "application " | INHALATION_SPRAY | CUTANEOUS | Status: DC | PRN

## 2022-03-13 NOTE — Progress Notes (Signed)
Plan of care told via stratus all questions answered.No complaints at this time .  ?

## 2022-03-13 NOTE — Plan of Care (Signed)
?  Problem: Education: ?Goal: Knowledge of General Education information will improve ?Description: Including pain rating scale, medication(s)/side effects and non-pharmacologic comfort measures ?Outcome: Completed/Met ?  ?Problem: Clinical Measurements: ?Goal: Ability to maintain clinical measurements within normal limits will improve ?Outcome: Completed/Met ?Goal: Will remain free from infection ?Outcome: Completed/Met ?  ?Problem: Education: ?Goal: Knowledge of condition will improve ?Outcome: Completed/Met ?  ?Problem: Activity: ?Goal: Will verbalize the importance of balancing activity with adequate rest periods ?Outcome: Completed/Met ?Goal: Ability to tolerate increased activity will improve ?Outcome: Completed/Met ?  ?Problem: Life Cycle: ?Goal: Chance of risk for complications during the postpartum period will decrease ?Outcome: Completed/Met ?  ?Problem: Role Relationship: ?Goal: Ability to demonstrate positive interaction with newborn will improve ?Outcome: Completed/Met ?  ?Problem: Skin Integrity: ?Goal: Demonstration of wound healing without infection will improve ?Outcome: Completed/Met ?  ?

## 2022-03-13 NOTE — Plan of Care (Signed)
?  Problem: Education: ?Goal: Knowledge of General Education information will improve ?Description: Including pain rating scale, medication(s)/side effects and non-pharmacologic comfort measures ?Outcome: Completed/Met ?  ?Problem: Clinical Measurements: ?Goal: Ability to maintain clinical measurements within normal limits will improve ?Outcome: Completed/Met ?Goal: Will remain free from infection ?Outcome: Completed/Met ?  ?Problem: Education: ?Goal: Knowledge of condition will improve ?Outcome: Completed/Met ?  ?Problem: Activity: ?Goal: Will verbalize the importance of balancing activity with adequate rest periods ?Outcome: Completed/Met ?Goal: Ability to tolerate increased activity will improve ?Outcome: Completed/Met ?  ?Problem: Education: ?Goal: Knowledge of General Education information will improve ?Description: Including pain rating scale, medication(s)/side effects and non-pharmacologic comfort measures ?Outcome: Completed/Met ?  ?Problem: Clinical Measurements: ?Goal: Ability to maintain clinical measurements within normal limits will improve ?Outcome: Completed/Met ?Goal: Will remain free from infection ?Outcome: Completed/Met ?  ?Problem: Education: ?Goal: Knowledge of condition will improve ?Outcome: Completed/Met ?  ?Problem: Activity: ?Goal: Will verbalize the importance of balancing activity with adequate rest periods ?Outcome: Completed/Met ?Goal: Ability to tolerate increased activity will improve ?Outcome: Completed/Met ?  ?

## 2022-03-13 NOTE — Lactation Note (Signed)
This note was copied from a baby's chart. ?Lactation Consultation Note ? ?Patient Name: Boy Oveta Idris ?Today's Date: 03/13/2022 ?Reason for consult: Initial assessment;Early term 37-38.6wks;Infant < 6lbs ?Age:33 hours ? ?Arabic interpreter used.  ?P4, Ex BF.  LC assisted with latching. ?Mother plans to breastfeed and supplement with formula.  ?Mother is cramping and requested pain med during feeding. Set up DEBP. ?Demonstrated how to use but mother still breastfeeding and cramping.  Did not get to fit flanges.  Let RN know. ?Reviewed volume guidelines for supplementation. ?Lactation brochure given.  ? ?Maternal Data ?Has patient been taught Hand Expression?: Yes ?Does the patient have breastfeeding experience prior to this delivery?: Yes ? ?Feeding ?Mother's Current Feeding Choice: Breast Milk and Formula ? ?LATCH Score ?Latch: Grasps breast easily, tongue down, lips flanged, rhythmical sucking. ? ?Audible Swallowing: A few with stimulation ? ?Type of Nipple: Everted at rest and after stimulation ? ?Comfort (Breast/Nipple): Soft / non-tender ? ?Hold (Positioning): Assistance needed to correctly position infant at breast and maintain latch. ? ?LATCH Score: 8 ? ? ?Lactation Tools Discussed/Used ?Tools: Pump ?Breast pump type: Double-Electric Breast Pump ?Pump Education: Setup, frequency, and cleaning ?Reason for Pumping: stimulation and supplementation ?Pumping frequency: Recommend q 3 hours ? ?Interventions ?  ? ?Discharge ?Pump: DEBP ?WIC Program: Yes ? ?Consult Status ?Consult Status: Follow-up ?Date: 03/14/22 ?Follow-up type: In-patient ? ? ? ?Dahlia Byes Boschen ?03/13/2022, 10:00 AM ? ? ? ?

## 2022-03-13 NOTE — Progress Notes (Signed)
PP day 1 ? ?Subjective     ? ?Jacqueline Irwin is doing well. Pain is well controlled with po pain medication and topical perineal preparations. She denies severe pain, heavy bleeding, palpitations, or sob. Ambulating and voiding without difficulty. Method of Feeding: breast /formula. Breastfeeding is going well.   ? ?  ?Objective  ? ?BP 120/89   Pulse 79   Temp 98 ?F (36.7 ?C) (Oral)   Resp 18   Ht 5\' 4"  (1.626 m)   Wt 98.4 kg   LMP 06/26/2021 (Exact Date)   Breastfeeding Unknown   BMI 37.23 kg/m?   ? ?Physical Exam ? ?Fundus: Firm, Below umbilicus ?Lochia: scant ?Perineum: intact, no edema present.  ?Breasts: non-engorged, nipples without cracks ?Extremities: no edema, no varices ? ? ?Labs/Radiology/Diagnostics ?03/12/22: WBC 10.0, Hgb 10.8 ?03/13/22: WBC 12.7, Hgb 10.3 ?  ? ?Assessment ? ?31 y.o. [id number] postpartum day 1 status post vaginal delivery without complications. Pregnancy course was uncomplicated. ? ?Plan ?Continue routine PP care ?Postpartum Teaching:   ?Discussed with patient appropriate postpartum hydration, breastfeeding, activity, avoiding vaginal insertion until lochia complete, and typical postpartum course. Patient advised to notify for fever/chills, excessive bleeding or passing large clots, uncontrolled pain, or s/s of DVT.   ?Plan D/C Tomorrow  ? ? ? ?Electronically Signed by: ?Greggory Keen, SNM ?03/13/22 7:48 AM  ?

## 2022-03-13 NOTE — Anesthesia Postprocedure Evaluation (Signed)
Anesthesia Post Note ? ?Patient: Jacqueline Irwin ? ?Procedure(s) Performed: AN AD HOC LABOR EPIDURAL ? ?  ? ?Patient location during evaluation: Mother Baby ?Anesthesia Type: Epidural ?Level of consciousness: awake, awake and alert and oriented ?Pain management: pain level controlled ?Vital Signs Assessment: post-procedure vital signs reviewed and stable ?Respiratory status: spontaneous breathing and respiratory function stable ?Cardiovascular status: blood pressure returned to baseline ?Postop Assessment: no headache, epidural receding, patient able to bend at knees, adequate PO intake, no backache, no apparent nausea or vomiting and able to ambulate ?Anesthetic complications: no ? ? ?No notable events documented. ? ?Last Vitals:  ?Vitals:  ? 03/13/22 0336 03/13/22 0730  ?BP: 120/89 116/81  ?Pulse: 79 80  ?Resp: 18 18  ?Temp: 36.7 ?C 36.7 ?C  ?SpO2:  99%  ?  ?Last Pain:  ?Vitals:  ? 03/13/22 0336  ?TempSrc: Oral  ?PainSc: 7   ? ?Pain Goal:   ? ?  ?  ?  ?  ?  ?  ?  ? ?Evelina Dun R ? ? ? ? ?

## 2022-03-14 ENCOUNTER — Other Ambulatory Visit (HOSPITAL_COMMUNITY): Payer: Self-pay

## 2022-03-14 MED ORDER — PENICILLIN V POTASSIUM 250 MG PO TABS
500.0000 mg | ORAL_TABLET | Freq: Four times a day (QID) | ORAL | Status: DC
  Administered 2022-03-14 (×2): 500 mg via ORAL
  Filled 2022-03-14 (×5): qty 2

## 2022-03-14 MED ORDER — NIFEDIPINE ER 30 MG PO TB24
30.0000 mg | ORAL_TABLET | Freq: Every day | ORAL | 1 refills | Status: DC
  Filled 2022-03-14: qty 30, 30d supply, fill #0

## 2022-03-14 MED ORDER — PENICILLIN V POTASSIUM 500 MG PO TABS
500.0000 mg | ORAL_TABLET | Freq: Four times a day (QID) | ORAL | 0 refills | Status: DC
  Filled 2022-03-14: qty 28, 7d supply, fill #0

## 2022-03-14 MED ORDER — FUROSEMIDE 20 MG PO TABS
20.0000 mg | ORAL_TABLET | Freq: Every day | ORAL | 0 refills | Status: DC
  Filled 2022-03-14: qty 4, 4d supply, fill #0

## 2022-03-14 MED ORDER — IBUPROFEN 600 MG PO TABS
600.0000 mg | ORAL_TABLET | Freq: Four times a day (QID) | ORAL | 0 refills | Status: DC | PRN
  Filled 2022-03-14: qty 30, 8d supply, fill #0

## 2022-03-14 MED ORDER — FUROSEMIDE 20 MG PO TABS
20.0000 mg | ORAL_TABLET | Freq: Every day | ORAL | Status: DC
  Administered 2022-03-14: 20 mg via ORAL
  Filled 2022-03-14: qty 1

## 2022-03-14 MED ORDER — NIFEDIPINE ER OSMOTIC RELEASE 30 MG PO TB24
30.0000 mg | ORAL_TABLET | Freq: Every day | ORAL | Status: DC
  Administered 2022-03-14: 30 mg via ORAL
  Filled 2022-03-14: qty 1

## 2022-03-14 NOTE — Progress Notes (Signed)
Discharged pt Jacqueline Irwin interpretor present [facility] [phone]. Pt verbalized understanding discharge instructions discharge papers printed in Arabic at bedside. Meds to beds at beside.  ?

## 2022-03-14 NOTE — Lactation Note (Signed)
This note was copied from a baby's chart. ?Lactation Consultation Note ? ?Patient Name: Jacqueline Irwin ?Today's Date: 03/14/2022 ?Reason for consult: Follow-up assessment;1st time breastfeeding;Primapara;Early term 37-38.6wks;Breastfeeding assistance ?Age:33 hours ? ?Arabic interpreter used [facility] #[id number].  ? ?Baby asleep in the basinet when [facility] entered the room. Mom is states that things are going well with breastfeeding, but nothing was coming out yet. Baby was circumcised today and has been sleepy. LC encouraged mom to continue to pump when baby does not latch and continue latching baby according to his feeding cues.  ? ?Mom states that she has been taught about milk storage and washing pump parts. LC asked mom if she had a DEBP at home and she stated that she does not. LC told mom that she can use her manual pump and call WIC to let them know that she has had a baby since she is being discharged.  ? ?LC reviewed output, engorgement, and warning signs. ? ?Mom is aware of outpatient lactation services and lactation support groups. ? ?Mom stated that she had no further questions.  ? ?Mom's current plan:  ?Continue to feed baby according to feeding cues.  ?Pump if baby does not latch.  ?Do not go more than 3-4 hours without latching or pumping and wake baby if it has been 4 hours since he last fed.  ?Watch output and call pediatrician if she has any concerns.  ?  ? ?Lactation Tools Discussed/Used ?  ? ?Interventions ?Interventions: Breast feeding basics reviewed;Education ? ?Discharge ?Discharge Education: Engorgement and breast care;Warning signs for feeding baby;Outpatient recommendation ?Pump: Manual (Per mom she does not have a DEBP at home. LC let mom know that she can call WIC and let them know that she has given birth since she is currently being discharged.) ? ?Consult Status ?Consult Status: Follow-up ?Date: 03/15/22 ?Follow-up type: In-patient ? ? ? ?Shauntee Karp P [place], IBCLC ?03/14/2022, 6:17 PM ? ? ? ?

## 2022-03-19 ENCOUNTER — Ambulatory Visit (INDEPENDENT_AMBULATORY_CARE_PROVIDER_SITE_OTHER): Payer: Medicaid Other

## 2022-03-19 VITALS — BP 122/79 | HR 108

## 2022-03-19 DIAGNOSIS — Z0130 Encounter for examination of blood pressure without abnormal findings: Secondary | ICD-10-CM

## 2022-03-19 NOTE — Progress Notes (Signed)
..  Subjective:  ?Jacqueline Irwin is a 31 y.o. female here for BP check.  ? ?Hypertension ROS: taking medications as instructed, no medication side effects noted, no TIA's, no chest pain on exertion, no dyspnea on exertion, and no swelling of ankles.  ? ? ?Objective:  ?BP 122/79   Pulse (!) 108   LMP 06/26/2021 (Exact Date)   ?Appearance alert, well appearing, and in no distress. ?General exam BP noted to be well controlled today in office.  ? ? ?Assessment:   ?Blood Pressure well controlled.  ? ?Plan:  ?Current treatment plan is effective, no change in therapy.Marland Kitchen ?Advised of abnormal symptoms to monitor for. PP visit is scheduled for 04/27/22 ? ? ?

## 2022-03-19 NOTE — Progress Notes (Signed)
Patient was assessed and managed by nursing staff during this encounter. I have reviewed the chart and agree with the documentation and plan. I have also made any necessary editorial changes. ? ?Mora Bellman, MD ?03/19/2022 7:25 PM  ? ?

## 2022-04-03 ENCOUNTER — Other Ambulatory Visit: Payer: Self-pay | Admitting: Family Medicine

## 2022-04-10 ENCOUNTER — Other Ambulatory Visit (HOSPITAL_COMMUNITY)
Admission: RE | Admit: 2022-04-10 | Discharge: 2022-04-10 | Disposition: A | Discharge status: Home / Self Care | Payer: Medicaid Other | Source: Ambulatory Visit | Attending: Obstetrics and Gynecology | Admitting: Obstetrics and Gynecology

## 2022-04-10 ENCOUNTER — Ambulatory Visit (INDEPENDENT_AMBULATORY_CARE_PROVIDER_SITE_OTHER): Payer: Medicaid Other

## 2022-04-10 VITALS — BP 120/84 | HR 96

## 2022-04-10 DIAGNOSIS — Z8759 Personal history of other complications of pregnancy, childbirth and the puerperium: Secondary | ICD-10-CM

## 2022-04-10 DIAGNOSIS — Z0130 Encounter for examination of blood pressure without abnormal findings: Secondary | ICD-10-CM

## 2022-04-10 DIAGNOSIS — N898 Other specified noninflammatory disorders of vagina: Secondary | ICD-10-CM | POA: Diagnosis not present

## 2022-04-10 DIAGNOSIS — K5900 Constipation, unspecified: Secondary | ICD-10-CM

## 2022-04-10 MED ORDER — DOCUSATE SODIUM 100 MG PO CAPS: 100.0000 mg | ORAL_CAPSULE | Freq: Two times a day (BID) | ORAL | 0 refills | Status: DC

## 2022-04-10 MED ORDER — TRIAMTERENE-HCTZ 37.5-25 MG PO TABS: ORAL_TABLET | ORAL | 0 refills | Status: DC

## 2022-04-10 NOTE — Progress Notes (Signed)
..  Subjective:  Jacqueline Irwin is a 31 y.o. female here for BP check.  Pt states that she has been having swelling for 10 days in hands and feet. Reports skipping doses of Nifedipine due to headaches unrelieved by pain medicine. Pt also report vaginal discharge/irritation and constipation.  Hypertension ROS: not taking medications regularly as instructed, medication side effects include: headaches, no TIA's, no chest pain on exertion, no dyspnea on exertion, and noting swelling of ankles.    Objective:  BP 120/84   Pulse 96   LMP 06/26/2021 (Exact Date)   Appearance alert, well appearing, and in no distress. General exam BP noted to be well controlled today in office.    Assessment:   Blood Pressure stable.   Plan:  Per in office provider, pt will discontinue Nifidepine and start Maxzide. Advised pt to increase water intake and report abnormal symptoms. PP visit is scheduled for 04/27/22.

## 2022-04-11 LAB — CERVICOVAGINAL ANCILLARY ONLY
Bacterial Vaginitis (gardnerella): NEGATIVE
Candida Glabrata: NEGATIVE
Candida Vaginitis: NEGATIVE
Chlamydia: NEGATIVE
Comment: NEGATIVE
Comment: NEGATIVE
Comment: NEGATIVE
Comment: NEGATIVE
Comment: NEGATIVE
Comment: NORMAL
Neisseria Gonorrhea: NEGATIVE
Trichomonas: NEGATIVE

## 2022-04-27 ENCOUNTER — Encounter: Payer: Self-pay | Admitting: Family Medicine

## 2022-04-27 ENCOUNTER — Other Ambulatory Visit: Payer: Medicaid Other

## 2022-04-27 ENCOUNTER — Ambulatory Visit (INDEPENDENT_AMBULATORY_CARE_PROVIDER_SITE_OTHER): Payer: Medicaid Other | Admitting: Family Medicine

## 2022-04-27 VITALS — BP 116/81 | HR 85 | Wt 199.5 lb

## 2022-04-27 DIAGNOSIS — Z30430 Encounter for insertion of intrauterine contraceptive device: Secondary | ICD-10-CM | POA: Diagnosis not present

## 2022-04-27 DIAGNOSIS — N898 Other specified noninflammatory disorders of vagina: Secondary | ICD-10-CM | POA: Diagnosis not present

## 2022-04-27 LAB — POCT URINE PREGNANCY: Preg Test, Ur: NEGATIVE

## 2022-04-27 MED ORDER — TERCONAZOLE 0.8 % VA CREA: 1.0000 | TOPICAL_CREAM | Freq: Every day | VAGINAL | 0 refills | Status: DC

## 2022-04-27 MED ORDER — PARAGARD INTRAUTERINE COPPER IU IUD
1.0000 | INTRAUTERINE_SYSTEM | Freq: Once | INTRAUTERINE | Status: AC
  Administered 2022-04-27: 1 via INTRAUTERINE

## 2022-05-24 ENCOUNTER — Other Ambulatory Visit: Payer: Medicaid Other

## 2022-05-24 ENCOUNTER — Other Ambulatory Visit (HOSPITAL_COMMUNITY)
Admission: RE | Admit: 2022-05-24 | Discharge: 2022-05-24 | Disposition: A | Discharge status: Home / Self Care | Payer: Medicaid Other | Source: Ambulatory Visit | Attending: Student | Admitting: Student

## 2022-05-24 ENCOUNTER — Encounter: Payer: Self-pay | Admitting: Student

## 2022-05-24 ENCOUNTER — Ambulatory Visit (INDEPENDENT_AMBULATORY_CARE_PROVIDER_SITE_OTHER): Payer: Medicaid Other | Admitting: Student

## 2022-05-24 VITALS — BP 121/86 | HR 87 | Wt 207.6 lb

## 2022-05-24 DIAGNOSIS — N898 Other specified noninflammatory disorders of vagina: Secondary | ICD-10-CM

## 2022-05-24 DIAGNOSIS — Z30431 Encounter for routine checking of intrauterine contraceptive device: Secondary | ICD-10-CM | POA: Diagnosis not present

## 2022-05-24 DIAGNOSIS — Z789 Other specified health status: Secondary | ICD-10-CM

## 2022-05-24 NOTE — Progress Notes (Signed)
Pt here for string check, no complaints.

## 2022-05-24 NOTE — Progress Notes (Signed)
  History:  [name] is a 31 y.o. [id number] who presents to clinic today for IUD String Check.   Patient has noticed itching at the exterior region of her vagina for a few days. Declines any vaginal bleeding, pain, or discharge. Negative for fever, chills, n/v, or abdominal pain.  The following portions of the patient's history were reviewed and updated as appropriate: allergies, current medications, family history, past medical history, social history, past surgical history and problem list.  Review of Systems:  Review of Systems  Constitutional:  Negative for chills and fever.  Respiratory:  Negative for cough and shortness of breath.   Cardiovascular:  Negative for chest pain and palpitations.  Gastrointestinal:  Negative for abdominal pain, nausea and vomiting.  Genitourinary:  Negative for dysuria, frequency and urgency.       Vaginal itching  Psychiatric/Behavioral: Negative.        Objective:  Physical Exam BP 121/86   Pulse 87   Wt 207 lb 9.6 oz (94.2 kg)   BMI 35.63 kg/m  Physical Exam Constitutional:      Appearance: Normal appearance. She is normal weight.  Cardiovascular:     Rate and Rhythm: Normal rate.  Pulmonary:     Effort: Pulmonary effort is normal.  Abdominal:     General: Abdomen is flat.     Palpations: Abdomen is soft.  Genitourinary:    General: Normal vulva.     Vagina: Vaginal discharge present.     Comments: Cervix is pink and moist, Strings visualized, Thick, white discharge present Neurological:     Mental Status: She is alert.  Psychiatric:        Mood and Affect: Mood normal.        Behavior: Behavior normal.        Thought Content: Thought content normal.       Labs and Imaging No results found for this or any previous visit (from the past 24 hour(s)).  No results found.  Health Maintenance Due  Topic Date Due   COVID-19 Vaccine (1) Never done   URINE MICROALBUMIN  Never done    Labs, imaging and  previous visits in Epic and Care Everywhere reviewed  Assessment & Plan:  1. Encounter for routine checking of intrauterine contraceptive device (IUD) - Strings present on exam, trimmed additional 1cm from ends - Instructed on periodic string checks, otherwise encouraged to visit office annually for well-women exam  2. Vaginal irritation - Cervicovaginal ancillary only( [place]) 3. Vaginal discharge - Cervicovaginal ancillary only( [place]) - Plan follow-up according to lab results  4. Language Barrier affecting healthcare - Provider recommended use of interpreter to facilitate care management. Video interpreter used at the bedside.   Approximately 30 minutes of total time was spent with this patient on counseling and coordination of care.  Corlis Hove, NP 05/24/2022 9:29 AM

## 2022-05-24 NOTE — Addendum Note (Signed)
Addended by: Jearld Adjutant on: 05/24/2022 11:10 AM   Modules accepted: Orders

## 2022-05-24 NOTE — Addendum Note (Signed)
Addended by: Leola Brazil on: 05/24/2022 11:35 AM   Modules accepted: Orders

## 2022-05-25 ENCOUNTER — Other Ambulatory Visit: Payer: Medicaid Other

## 2022-05-25 LAB — CERVICOVAGINAL ANCILLARY ONLY
Bacterial Vaginitis (gardnerella): NEGATIVE
Candida Glabrata: NEGATIVE
Candida Vaginitis: NEGATIVE
Chlamydia: NEGATIVE
Comment: NEGATIVE
Comment: NEGATIVE
Comment: NEGATIVE
Comment: NEGATIVE
Comment: NEGATIVE
Comment: NORMAL
Neisseria Gonorrhea: NEGATIVE
Trichomonas: NEGATIVE

## 2022-05-25 LAB — GLUCOSE TOLERANCE, 2 HOURS
Glucose, 2 hour: 108 mg/dL (ref 70–139)
Glucose, GTT - Fasting: 90 mg/dL (ref 70–99)

## 2022-07-22 ENCOUNTER — Emergency Department (HOSPITAL_COMMUNITY)
Admission: EM | Admit: 2022-07-22 | Discharge: 2022-07-23 | Disposition: A | Discharge status: Home / Self Care | Payer: Medicaid Other | Attending: Emergency Medicine | Admitting: Emergency Medicine

## 2022-07-22 ENCOUNTER — Emergency Department (HOSPITAL_COMMUNITY): Payer: Medicaid Other

## 2022-07-22 ENCOUNTER — Encounter (HOSPITAL_COMMUNITY): Payer: Self-pay

## 2022-07-22 ENCOUNTER — Other Ambulatory Visit: Payer: Self-pay

## 2022-07-22 DIAGNOSIS — K449 Diaphragmatic hernia without obstruction or gangrene: Secondary | ICD-10-CM | POA: Diagnosis not present

## 2022-07-22 DIAGNOSIS — R791 Abnormal coagulation profile: Secondary | ICD-10-CM | POA: Diagnosis not present

## 2022-07-22 DIAGNOSIS — E876 Hypokalemia: Secondary | ICD-10-CM | POA: Diagnosis not present

## 2022-07-22 DIAGNOSIS — R519 Headache, unspecified: Secondary | ICD-10-CM | POA: Diagnosis not present

## 2022-07-22 DIAGNOSIS — Z20822 Contact with and (suspected) exposure to covid-19: Secondary | ICD-10-CM | POA: Diagnosis not present

## 2022-07-22 DIAGNOSIS — R079 Chest pain, unspecified: Secondary | ICD-10-CM | POA: Diagnosis present

## 2022-07-22 LAB — URINALYSIS, ROUTINE W REFLEX MICROSCOPIC
Bacteria, UA: NONE SEEN
Bilirubin Urine: NEGATIVE
Glucose, UA: NEGATIVE mg/dL
Ketones, ur: 5 mg/dL — AB
Nitrite: NEGATIVE
Protein, ur: NEGATIVE mg/dL
Specific Gravity, Urine: 1.012 (ref 1.005–1.030)
pH: 6 (ref 5.0–8.0)

## 2022-07-22 LAB — TROPONIN I (HIGH SENSITIVITY)
Troponin I (High Sensitivity): 2 ng/L (ref <18)
Troponin I (High Sensitivity): <2 ng/L (ref <18)

## 2022-07-22 LAB — D-DIMER, QUANTITATIVE: D-Dimer, Quant: 0.53 ug/mL-FEU — ABNORMAL HIGH (ref 0.00–0.50)

## 2022-07-22 LAB — CBC
HCT: 36.6 % (ref 36.0–46.0)
Hemoglobin: 12.3 g/dL (ref 12.0–15.0)
MCH: 27.3 pg (ref 26.0–34.0)
MCHC: 33.6 g/dL (ref 30.0–36.0)
MCV: 81.2 fL (ref 80.0–100.0)
Platelets: 320 10*3/uL (ref 150–400)
RBC: 4.51 MIL/uL (ref 3.87–5.11)
RDW: 12.9 % (ref 11.5–15.5)
WBC: 8.3 10*3/uL (ref 4.0–10.5)
nRBC: 0 % (ref 0.0–0.2)

## 2022-07-22 LAB — BASIC METABOLIC PANEL
Anion gap: 11 (ref 5–15)
BUN: 9 mg/dL (ref 6–20)
CO2: 23 mmol/L (ref 22–32)
Calcium: 9.3 mg/dL (ref 8.9–10.3)
Chloride: 105 mmol/L (ref 98–111)
Creatinine, Ser: 0.71 mg/dL (ref 0.44–1.00)
GFR, Estimated: >60 mL/min (ref >60)
Glucose, Bld: 103 mg/dL — ABNORMAL HIGH (ref 70–99)
Potassium: 3.4 mmol/L — ABNORMAL LOW (ref 3.5–5.1)
Sodium: 139 mmol/L (ref 135–145)

## 2022-07-22 LAB — I-STAT BETA HCG BLOOD, ED (MC, WL, AP ONLY): I-stat hCG, quantitative: <5 m[IU]/mL (ref <5)

## 2022-07-22 MED ORDER — KETOROLAC TROMETHAMINE 15 MG/ML IJ SOLN
15.0000 mg | Freq: Once | INTRAMUSCULAR | Status: AC
  Administered 2022-07-22: 15 mg via INTRAVENOUS
  Filled 2022-07-22: qty 1

## 2022-07-22 MED ORDER — DEXAMETHASONE SODIUM PHOSPHATE 10 MG/ML IJ SOLN
10.0000 mg | Freq: Once | INTRAMUSCULAR | Status: AC
  Administered 2022-07-22: 10 mg via INTRAVENOUS
  Filled 2022-07-22: qty 1

## 2022-07-22 MED ORDER — METOCLOPRAMIDE HCL 5 MG/ML IJ SOLN
10.0000 mg | Freq: Once | INTRAMUSCULAR | Status: AC
  Administered 2022-07-22: 10 mg via INTRAVENOUS
  Filled 2022-07-22: qty 2

## 2022-07-22 MED ORDER — LACTATED RINGERS IV BOLUS
1000.0000 mL | Freq: Once | INTRAVENOUS | Status: AC
  Administered 2022-07-22: 1000 mL via INTRAVENOUS

## 2022-07-22 MED ORDER — IOHEXOL 350 MG/ML SOLN
75.0000 mL | Freq: Once | INTRAVENOUS | Status: AC | PRN
  Administered 2022-07-22: 75 mL via INTRAVENOUS

## 2022-07-22 NOTE — ED Triage Notes (Addendum)
Pt began having left sided chest and shoulder pain last night while sitting down. Pt also c/o SOB. Pt c/o headache with chest pain. Pain is worse with pressure and when she lies down

## 2022-07-22 NOTE — ED Provider Notes (Cosign Needed Addendum)
[facility] EMERGENCY DEPARTMENT Provider Note   CSN: [id number] Arrival date & time: 07/22/22  1928     History Chief Complaint  Patient presents with   Chest Pain    Jacqueline Irwin is a 31 y.o. female otherwise healthy presents to the emerged department for evaluation of chest pain.  Patient reports that she is having chest pain radiating to her left shoulder since yesterday that is been waxing and waning, but was worse tonight.  She also reports a headache with some blurry vision.  She reports some mild shortness of breath.  Denies any exacerbating or relieving factors.  She reports some runny nose and nasal congestion as well.  Denies any nausea, vomiting, diarrhea, constipation, hematochezia, melena.  Denies any OCP use.  Denies any long travel.  Denies any leg swelling. Patient reports that this feels like her typical headache.  Denies any trouble walking or any trouble talking.  She denies history of blood clots but reports she is concerned for one.   Chest Pain Associated symptoms: headache and shortness of breath   Associated symptoms: no abdominal pain, no back pain, no cough, no fever, no nausea, no palpitations, no vomiting and no weakness        Home Medications Prior to Admission medications   Medication Sig Start Date End Date Taking? Authorizing Provider  docusate sodium (COLACE) 100 MG capsule Take 1 capsule (100 mg total) by mouth 2 (two) times daily. Patient not taking: Reported on 04/27/2022 04/10/22   Chancy Milroy, MD  furosemide (LASIX) 20 MG tablet Take 1 tablet (20 mg total) by mouth daily. Patient not taking: Reported on 03/19/2022 03/14/22   Roma Schanz, CNM  ibuprofen (ADVIL) 600 MG tablet Take 1 tablet (600 mg total) by mouth every 6 (six) hours as needed for mild pain, moderate pain or cramping. Patient not taking: Reported on 04/10/2022 03/14/22   Roma Schanz, CNM  NIFEdipine (ADALAT CC) 30 MG 24 hr tablet Take 1  tablet (30 mg total) by mouth daily. Patient not taking: Reported on 04/27/2022 03/15/22   Roma Schanz, CNM  penicillin v potassium (VEETID) 500 MG tablet Take 1 tablet (500 mg total) by mouth 4 (four) times daily. Patient not taking: Reported on 04/10/2022 03/14/22   Roma Schanz, CNM  Prenatal Vit-Fe Fumarate-FA (PREPLUS) 27-1 MG TABS Take 1 tablet by mouth daily. Patient not taking: Reported on 05/24/2022 01/29/22   Elvera Maria, CNM  terconazole (TERAZOL 3) 0.8 % vaginal cream Place 1 applicator vaginally at bedtime. Patient not taking: Reported on 05/24/2022 04/27/22   Donnamae Jude, MD  triamterene-hydrochlorothiazide [facility]) 37.5-25 MG tablet Take 1 pill twice a day for 3 days, then take 1 pill Daily for 7 days Patient not taking: Reported on 04/27/2022 04/10/22   Chancy Milroy, MD      Allergies    Beef-derived products, Chicken protein, Fish-derived products, and Pork-derived products    Review of Systems   Review of Systems  Constitutional:  Negative for chills and fever.  HENT:  Positive for congestion and rhinorrhea.   Respiratory:  Positive for shortness of breath. Negative for cough.   Cardiovascular:  Positive for chest pain. Negative for palpitations.  Gastrointestinal:  Negative for abdominal pain, constipation, diarrhea, nausea and vomiting.  Genitourinary:  Negative for dysuria and hematuria.  Musculoskeletal:  Negative for back pain.  Neurological:  Positive for headaches. Negative for weakness and light-headedness.    Physical Exam  Updated Vital Signs BP (!) 124/98   Pulse 77   Temp 97.8 F (36.6 C) (Oral)   Resp 14   Ht 5\' 4"  (1.626 m)   Wt 94.2 kg   SpO2 100%   BMI 35.65 kg/m  Physical Exam Vitals and nursing note reviewed.  Constitutional:      General: She is not in acute distress.    Appearance: Normal appearance. She is not ill-appearing, toxic-appearing or diaphoretic.  HENT:     Head: Normocephalic and atraumatic.  Eyes:      General: No scleral icterus.    Extraocular Movements: Extraocular movements intact.     Pupils: Pupils are equal, round, and reactive to light.  Cardiovascular:     Rate and Rhythm: Normal rate and regular rhythm.  Pulmonary:     Effort: Pulmonary effort is normal. No tachypnea, accessory muscle usage or respiratory distress.     Breath sounds: Normal breath sounds.  Chest:     Chest wall: Tenderness present. No deformity or crepitus.     Comments: Left-sided chest tenderness palpation into the left shoulder.  No crepitus.  No overlying skin changes noted.  No increased warmth or erythema. Abdominal:     General: Abdomen is flat. Bowel sounds are normal. There is no abdominal bruit.     Palpations: Abdomen is soft.     Tenderness: There is no abdominal tenderness. There is no guarding or rebound.  Musculoskeletal:        General: No deformity.     Cervical back: Normal range of motion.     Right lower leg: No tenderness. No edema.     Left lower leg: No tenderness. No edema.  Skin:    General: Skin is warm and dry.  Neurological:     General: No focal deficit present.     Mental Status: She is alert. Mental status is at baseline.     GCS: GCS eye subscore is 4. GCS verbal subscore is 5. GCS motor subscore is 6.     Cranial Nerves: Cranial nerves 2-12 are intact. No cranial nerve deficit or facial asymmetry.     Sensory: No sensory deficit.     Motor: No weakness or pronator drift.     Coordination: Finger-Nose-Finger Test normal.     Comments: GCS 15.  Patient alert and oriented.  No cranial nerve deficit noted.  No facial asymmetry.  Patient is speaking in her native language, unsure if there is any dysarthria however the interpreter does not appear to be struggling.  No sensation deficit.  Strength is 5-5 in patient's upper and lower bilateral extremities.  Sensation intact throughout.  Palpable pulses.  No pronator drift.  Normal finger-nose-finger     ED Results /  Procedures / Treatments   Labs (all labs ordered are listed, but only abnormal results are displayed) Labs Reviewed  BASIC METABOLIC PANEL - Abnormal; Notable for the following components:      Result Value   Potassium 3.4 (*)    Glucose, Bld 103 (*)    All other components within normal limits  CBC  I-STAT BETA HCG BLOOD, ED ([facility], [facility], [facility] ONLY)  TROPONIN I (HIGH SENSITIVITY)  TROPONIN I (HIGH SENSITIVITY)    EKG None  Radiology CT Angio Chest PE W and/or Wo Contrast  Result Date: 07/22/2022 CLINICAL DATA:  Left-sided chest pain for 2 days EXAM: CT ANGIOGRAPHY CHEST WITH CONTRAST TECHNIQUE: Multidetector CT imaging of the chest was performed using the standard protocol during  bolus administration of intravenous contrast. Multiplanar CT image reconstructions and MIPs were obtained to evaluate the vascular anatomy. RADIATION DOSE REDUCTION: This exam was performed according to the departmental dose-optimization program which includes automated exposure control, adjustment of the mA and/or kV according to patient size and/or use of iterative reconstruction technique. CONTRAST:  77mL OMNIPAQUE IOHEXOL 350 MG/ML SOLN COMPARISON:  None Available. FINDINGS: Cardiovascular: Thoracic aorta shows no aneurysmal dilatation. No cardiac enlargement is noted. The pulmonary artery shows a normal branching pattern bilaterally. No filling defect to suggest pulmonary embolism is noted. Mediastinum/Nodes: Thoracic inlet is within normal limits. No hilar or mediastinal adenopathy is noted. The esophagus shows a sliding-type hiatal hernia. Lungs/Pleura: The lungs are well aerated bilaterally. No focal infiltrate or sizable effusion is seen. No parenchymal nodules are noted. Upper Abdomen: Visualized upper abdomen shows no acute abnormality. Musculoskeletal: No acute rib abnormality is noted. No compression deformity of the thoracic spine is seen. Review of the MIP images confirms the above findings. IMPRESSION: No  evidence of pulmonary emboli. Sliding-type hiatal hernia. Electronically Signed   By: Inez Catalina M.D.   On: 07/22/2022 23:58   DG Chest 2 View  Result Date: 07/22/2022 CLINICAL DATA:  Left-sided chest pain for 2 days, initial encounter EXAM: CHEST - 2 VIEW COMPARISON:  None Available. FINDINGS: The heart size and mediastinal contours are within normal limits. Both lungs are clear. The visualized skeletal structures are unremarkable. IMPRESSION: No active cardiopulmonary disease. Electronically Signed   By: Inez Catalina M.D.   On: 07/22/2022 20:03     Procedures Procedures    Medications Ordered in ED Medications - No data to display  ED Course/ Medical Decision Making/ A&P                           Medical Decision Making Amount and/or Complexity of Data Reviewed Labs: ordered. Radiology: ordered.  Risk Prescription drug management.   31 year old female presents the emergency room for evaluation of chest pain rating to her left shoulder as well as a headache.  Differential diagnosis includes but is not limited to ACS, arrhythmia, PE, pneumonia, dissection, aneurysm, subarachnoid hemorrhage, typical migraine, viral ideology.  Vital signs show a little bit elevated blood pressure.  She is mildly tachypneic around a respiratory rate of 20 but is speaking in full sentences without any take breaths.  She is well-appearing.  Physical exam noted above.  We will order patient D-dimer given her recent birth of her child in May as well as her slight tachypnea.  Additionally, will order migraine cocktail for patient's headache.  I independent reviewed and interpreted the patient's labs.  Troponin 2 with repeat at less than 2.  Urinalysis shows hazy urine with small amount of hemoglobin 5.5 ketones and trace leukocytes but no bacteria seen.  6-10 squamous epithelial seen.  Likely dirty catch.  CBC shows no leukocytosis or anemia.  BMP shows slight decrease in potassium 3.4.  Mild increase in  glucose at 1030 the patient not fasting.  No other electrolyte abnormality.  I-STAT hCG negative.  Negative for COVID and flu.  Patient's D-dimer is slightly elevated at 0.53.  We will need to order CTA of chest to rule out any PE.  CTA of chest shows no evidence of pulmonary emboli. Sliding-type hiatal hernia.  On reevaluation after migraine cocktail, patient reports that his headache is completely resolved and her blurry vision is no longer present.  Doubt any ACS given reassuring EKG  and negative troponins.  No signs of PE seen on imaging.  No signs of pneumonia seen on imaging.  Story not consistent with dissection or aneurysm.  I do not think this patient needs any head imaging as her headache relieved with migraine cocktail and she did not have any neurodeficits.  I discussed the patient's lab and imaging results with the patient.  Recommend this is likely a musculoskeletal problem or is her hiatal hernia that is causing her this chest pain and shortness of breath.  We discussed need to follow-up.  We discussed return precautions about symptoms.  Patient verbalized understanding and agrees to the plan.  Patient is stable being discharged home in good condition.  Translator used during entirety of this encounter.  Final Clinical Impression(s) / ED Diagnoses Final diagnoses:  Hiatal hernia  Bad headache    Rx / DC Orders ED Discharge Orders          Ordered    pantoprazole (PROTONIX) 20 MG tablet  Daily        07/23/22 0020              Sherrell Puller, PA-C 07/27/22 0112    Sherrell Puller, PA-C 07/30/22 0945    Drenda Freeze, MD 07/31/22 [id number]

## 2022-07-23 LAB — RESP PANEL BY RT-PCR (FLU A&B, COVID) ARPGX2
Influenza A by PCR: NEGATIVE
Influenza B by PCR: NEGATIVE
SARS Coronavirus 2 by RT PCR: NEGATIVE

## 2022-07-23 MED ORDER — PANTOPRAZOLE SODIUM 20 MG PO TBEC: 20.0000 mg | DELAYED_RELEASE_TABLET | Freq: Every day | ORAL | 0 refills | Status: DC

## 2022-07-23 NOTE — Discharge Instructions (Addendum)
Follow up with a primary care provider.

## 2022-07-23 NOTE — ED Notes (Signed)
Patient verbalizes understanding of d/c instructions. Opportunities for questions and answers were provided. Pt d/c from ED and ambulated to lobby where husband is picking pt up.  

## 2022-08-28 ENCOUNTER — Ambulatory Visit: Payer: Self-pay

## 2022-08-28 NOTE — Telephone Encounter (Signed)
  Chief Complaint: Chest pain Symptoms: Chest pain that is painful in her back at the same time Frequency: 07/20/2022 Pertinent Negatives: Patient denies Fever, nausea, numbness Disposition: [x] ED /[] Urgent Care (no appt availability in office) / [] Appointment(In office/virtual)/ []  [facility]/ [] Home Care/ [] Refused Recommended Disposition /[] [facility]/ []  Follow-up with PCP Additional Notes: Pt refuses ED, but will seek care if she feels she needs it.PT states that the pain increases with exertion, and takes awhile to resolve. She take pantoprazole and it seems to help. Pt thinks this is the hernia diagnosed at the ed. Make new pt appt. As requested by pt.   Reason for Disposition  [1] Chest pain lasts > 5 minutes AND [2] occurred in past 3 days (72 hours) (Exception: Feels exactly the same as previously diagnosed heartburn and has accompanying sour taste in mouth.)  Answer Assessment - Initial Assessment Questions 1. LOCATION: "Where does it hurt?"       chest 2. RADIATION: "Does the pain go anywhere else?" (e.g., into neck, jaw, arms, back)     No - maybe to her back 3. ONSET: "When did the chest pain begin?" (Minutes, hours or days)      07/20/2022 4. PATTERN: "Does the pain come and go, or has it been constant since it started?"  "Does it get worse with exertion?"      Comes and goes - gets worse with exertion 5. DURATION: "How long does it last" (e.g., seconds, minutes, hours)     A day or 2 6. SEVERITY: "How bad is the pain?"  (e.g., Scale 1-10; mild, moderate, or severe)    - MILD (1-3): doesn't interfere with normal activities     - MODERATE (4-7): interferes with normal activities or awakens from sleep    - SEVERE (8-10): excruciating pain, unable to do any normal activities       3/10 7. CARDIAC RISK FACTORS: "Do you have any history of heart problems or risk factors for heart disease?" (e.g., angina, prior heart attack; diabetes, high blood pressure,  high cholesterol, smoker, or strong family history of heart disease)     no 8. PULMONARY RISK FACTORS: "Do you have any history of lung disease?"  (e.g., blood clots in lung, asthma, emphysema, birth control pills)     no 9. CAUSE: "What do you think is causing the chest pain?"     No - hernia 10. OTHER SYMPTOMS: "Do you have any other symptoms?" (e.g., dizziness, nausea, vomiting, sweating, fever, difficulty breathing, cough)       no 11. PREGNANCY: "Is there any chance you are pregnant?" "When was your last menstrual period?"       no  Protocols used: Chest Pain-A-AH

## 2022-11-01 ENCOUNTER — Other Ambulatory Visit: Payer: Self-pay

## 2022-11-01 ENCOUNTER — Encounter: Payer: Self-pay | Admitting: Internal Medicine

## 2022-11-01 ENCOUNTER — Ambulatory Visit: Payer: Medicaid Other | Attending: Internal Medicine | Admitting: Internal Medicine

## 2022-11-01 VITALS — BP 122/89 | HR 84 | Temp 97.7°F | Ht 64.0 in | Wt 216.0 lb

## 2022-11-01 DIAGNOSIS — Z7689 Persons encountering health services in other specified circumstances: Secondary | ICD-10-CM

## 2022-11-01 DIAGNOSIS — N898 Other specified noninflammatory disorders of vagina: Secondary | ICD-10-CM | POA: Diagnosis not present

## 2022-11-01 DIAGNOSIS — R079 Chest pain, unspecified: Secondary | ICD-10-CM

## 2022-11-01 DIAGNOSIS — K219 Gastro-esophageal reflux disease without esophagitis: Secondary | ICD-10-CM | POA: Diagnosis not present

## 2022-11-01 DIAGNOSIS — R030 Elevated blood-pressure reading, without diagnosis of hypertension: Secondary | ICD-10-CM | POA: Diagnosis not present

## 2022-11-01 MED ORDER — OMEPRAZOLE 20 MG PO CPDR
20.0000 mg | DELAYED_RELEASE_CAPSULE | Freq: Two times a day (BID) | ORAL | 1 refills | Status: DC
  Filled 2022-11-01: qty 60, 30d supply, fill #0
  Filled 2022-12-14: qty 60, 30d supply, fill #1

## 2022-11-01 MED ORDER — TERCONAZOLE 0.8 % VA CREA
1.0000 | TOPICAL_CREAM | Freq: Every day | VAGINAL | 0 refills | Status: DC
  Filled 2022-11-01: qty 20, 7d supply, fill #0

## 2022-11-01 NOTE — Progress Notes (Signed)
Patient ID: Jacqueline Irwin, female    DOB: Jan 15, 1991  MRN: [id number]  CC: Establish Care (Establish care / new patient. /Discuss hernia, still having pain around sternum area and radiating to back. Swelling. Ardelia Mems to loose weight )   Subjective: Jacqueline Irwin is a 31 y.o. female who presents for new pt visit Her concerns today include:  Pt with history of gestational diabetes, obesity, lichen sclerosis, preeclampsia  Pt presents with a few concerns.  C/O CP x 3-4 mths.  Seen in ER for same 07/2022.  Work up negative except for hiatial hernia seen on CTA of chest. Pain comes and goes.  Can last 2-3 days; worse first day then decreases 2-3rd day and radiates straight through to her back.  Described as tightness and cramping.   During 1st day of pain she has problem swallowing where she feels food gets stuck in chest. Has to drink fluid to get it to go down otherwise she has pain. No wgh loss Located in center of chest b/w the breast more so toward LT breast No assoc N/V but sometimes she wakes up at nights with reflux which makes her feels like she is choking.  Endorses frequent heartburn especially when eating spicy foods but she tries to avoid spicy foods. RXN Protonix from ER but Omeprazole works better OTC PRN.  Not on NSAIDS No assoc SOB No relation to food -worse when lifting heavy objects, position changes and when laying flat on her back.  Does not do a lot of lifting at home.  Takes Tylenol which helps.    Diastolic noted to be elevated.  She reports having blood pressure issues during her last pregnancy.  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   No current facility-administered medications on file prior to visit.    Allergies  Allergen Reactions   Beef-Derived Products    Chicken Protein    Fish-Derived Products    Pork-Derived Products     Social History   Socioeconomic History   Marital status: Married    Spouse name: Not on file   Number of children: Not on file   Years of education: Not on file   Highest education level: Not on file  Occupational History   Not on file  Tobacco Use   Smoking status: Never   Smokeless tobacco: Never  Vaping Use   Vaping Use: Never used  Substance and Sexual Activity   Alcohol use: No   Drug use: No   Sexual activity: Not Currently  Other Topics Concern   Not on file  Social History Narrative   Not on file   Social Determinants of Health   Financial Resource Strain: Not on file  Food Insecurity: Not on file  Transportation Needs: Not on file  Physical Activity: Not on file  Stress: Not on file  Social Connections: Not on file  Intimate Partner Violence: Not on file    Family History  Problem Relation Age of Onset   Diabetes Mother    Diabetes Paternal Grandmother    Hypertension Paternal Grandmother     Past  Surgical History:  Procedure Laterality Date   abcess of vulva     APPENDECTOMY      ROS: Review of Systems Negative except as stated above  PHYSICAL EXAM: BP 122/89 (BP Location: Left Arm, Patient Position: Sitting, Cuff Size: Normal)   Pulse 84   Temp 97.7 F (36.5 C) (Oral)   Ht 5\' 4"  (1.626 m)   Wt 216 lb (98 kg)   SpO2 100%   BMI 37.08 kg/m   Wt Readings from Last 3 Encounters:  11/01/22 216 lb (98 kg)  07/22/22 207 lb 10.8 oz (94.2 kg)  05/24/22 207 lb 9.6 oz (94.2 kg)  BP 128/88  Physical Exam  General appearance - alert, well appearing, young to middle-age appearing female and in no distress Mental status - normal mood, behavior, speech, dress, motor activity, and  thought processes Neck - supple, no significant adenopathy Chest - clear to auscultation, no wheezes, rales or rhonchi, symmetric air entry Heart - normal rate, regular rhythm, normal S1, S2, no murmurs, rubs, clicks or gallops.  No rubs heard when patient was asked to lean forward.  She has reproducible tenderness over the lower half of the sternum and more towards the left breast.  No masses palpated in the left breast tissue close to the sternum. Abdomen: Nondistended, normal bowel sounds, soft, nontender, no hepatosplenomegaly Extremities - peripheral pulses normal, no pedal edema, no clubbing or cyanosis  CTA of chest report that was done 07/2022 was reviewed.    Latest Ref Rng & Units 07/22/2022    7:51 PM 03/12/2022    4:22 PM 09/20/2021    9:03 AM  CMP  Glucose 70 - 99 mg/dL 09/22/2021  88  86   BUN 6 - 20 mg/dL 9  <5  8   Creatinine 130 - 1.00 mg/dL 8.65  7.84  6.96   Sodium 135 - 145 mmol/L 139  138  139   Potassium 3.5 - 5.1 mmol/L 3.4  4.1  3.9   Chloride 98 - 111 mmol/L 105  108  104   CO2 22 - 32 mmol/L 23  25  20    Calcium 8.9 - 10.3 mg/dL 9.3  8.6  9.1   Total Protein 6.5 - 8.1 g/dL  6.3  7.0   Total Bilirubin 0.3 - 1.2 mg/dL  0.3  2.95   Alkaline Phos 38 - 126 U/L  124  91   AST 15 - 41 U/L  25  18   ALT 0 - 44 U/L  26  25    Lipid Panel  No results found for: "CHOL", "TRIG", "HDL", "CHOLHDL", "VLDL", "LDLCALC", "LDLDIRECT"  CBC    Component Value Date/Time   WBC 8.3 07/22/2022 1951   RBC 4.51 07/22/2022 1951   HGB 12.3 07/22/2022 1951   HGB 10.9 (L) 01/15/2022 1107   HCT 36.6 07/22/2022 1951   HCT 32.4 (L) 01/15/2022 1107   PLT 320 07/22/2022 1951   PLT 238 01/15/2022 1107   MCV 81.2 07/22/2022 1951   MCV 76 (L) 01/15/2022 1107   MCH 27.3 07/22/2022 1951   MCHC 33.6 07/22/2022 1951   RDW 12.9 07/22/2022 1951   RDW 14.7 01/15/2022 1107   LYMPHSABS 1.7 09/20/2021 0903   EOSABS 0.1 09/20/2021 0903   BASOSABS 0.0 09/20/2021 0903    ASSESSMENT AND PLAN: 1.  Encounter to establish care   2. Chest pain in adult Sounds musculoskeletal in nature with GI component as well. Given the reports of reflux symptoms at  night and feeling as though food sometimes gets stuck in the middle of the chest, I recommend taking the omeprazole 20 mg twice a day instead of as needed.  Advised to avoid foods that can cause acid reflux. -Patient is breast-feeding.  I checked with up-to-date and also with our clinical pharmacist to make sure that the omeprazole is okay to use in breast-feeding. -Also advised use of Tylenol as needed for the muscle component of the chest pain. Will have her follow-up in 6 weeks to see how she is doing with these measures.  3. Gastroesophageal reflux disease without esophagitis See #2 above - omeprazole (PRILOSEC) 20 MG capsule; Take 1 capsule (20 mg total) by mouth 2 (two) times daily before a meal.  Dispense: 60 capsule; Refill: 1  4. Elevated blood pressure reading in office without diagnosis of hypertension Patient had issue with blood pressure during pregnancy.  Diastolic elevated today.  DASH diet discussed and encouraged.  We will plan to recheck blood pressure on subsequent visit.  5. Vaginal irritation After the visit was completed, patient returned to the checkout window and asked my CMA to have me refill terazosin which she had used in the past for vaginal irritation.  Refill given - terconazole (TERAZOL 3) 0.8 % vaginal cream; Place 1 applicator vaginally at bedtime.  Dispense: 20 g; Refill: 0   AMN Language interpreter used during this encounter. #[id number], Lafonda Mosses  Patient was given the opportunity to ask questions.  Patient verbalized understanding of the plan and was able to repeat key elements of the plan.   This documentation was completed using Paediatric nurse.  Any transcriptional errors are unintentional.  No orders of the defined types were placed in this encounter.    Requested Prescriptions    Signed Prescriptions Disp Refills   omeprazole (PRILOSEC) 20 MG capsule 60 capsule 1    Sig: Take 1 capsule (20 mg total) by mouth 2 (two) times daily before a meal.   terconazole (TERAZOL 3) 0.8 % vaginal cream 20 g 0    Sig: Place 1 applicator vaginally at bedtime.    Return in about 6 weeks (around 12/13/2022).  Jonah Blue, MD, FACP

## 2022-12-14 ENCOUNTER — Encounter: Payer: Self-pay | Admitting: Internal Medicine

## 2022-12-14 ENCOUNTER — Other Ambulatory Visit (HOSPITAL_COMMUNITY)
Admission: RE | Admit: 2022-12-14 | Discharge: 2022-12-14 | Disposition: A | Discharge status: Home / Self Care | Payer: Medicaid Other | Source: Ambulatory Visit | Attending: Internal Medicine | Admitting: Internal Medicine

## 2022-12-14 ENCOUNTER — Ambulatory Visit: Payer: Medicaid Other | Attending: Internal Medicine | Admitting: Internal Medicine

## 2022-12-14 ENCOUNTER — Other Ambulatory Visit: Payer: Self-pay

## 2022-12-14 VITALS — BP 114/79 | HR 81 | Temp 98.3°F | Ht 64.0 in | Wt 212.0 lb

## 2022-12-14 DIAGNOSIS — N760 Acute vaginitis: Secondary | ICD-10-CM | POA: Insufficient documentation

## 2022-12-14 DIAGNOSIS — K219 Gastro-esophageal reflux disease without esophagitis: Secondary | ICD-10-CM

## 2022-12-14 DIAGNOSIS — E669 Obesity, unspecified: Secondary | ICD-10-CM

## 2022-12-14 DIAGNOSIS — M25471 Effusion, right ankle: Secondary | ICD-10-CM

## 2022-12-14 NOTE — Progress Notes (Signed)
Patient ID: Jacqueline Irwin, female    DOB: 05-05-1991  MRN: [id number]  CC: Follow-up (6 week f/u. Med refill - omeprazole  /Discuss weight - pt currently dieting & exercising. Swelling of R foot when exercising /Intermittent vaginal itching X1 mo)   Subjective: Jacqueline Irwin is a 32 y.o. female who presents for 6 wks f/u Her concerns today include:  Pt with history of gestational diabetes, obesity, lichen sclerosis, preeclampsia   Doing well on Prilosec for GERD.  No further chest pains.  BP elev on last visit.  DASH diet discussed.  Reports she has made changes in her eating habits and started exercising regularly.  She has started eating more green leafy vegetables, she has cut out sugary drinks and does not use sugar in her tea.  She has stopped eating bread.  For lunch she usually has a salad with tuna on it.  She tells me that she is also taking several over-the-counter vitamins including omega-3, vitamin D 1000 IU, vitamin C 1000 mg, zinc and vitamin B12 1000 mcg daily.  She drinks a protein powder shake call [employer] about 30 minutes before exercising. She exercises 4 days a week for about an hour and a half doing cardio and some strengthening.  She feels she has lost some inches but weight has not changed much as yet. She also note swelling in the right ankle over the lateral malleolus when she exercises.  No pain in the ankle.  Reports vaginal itching that is no better with Terazol vaginal insert.  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 60  capsule 1   terconazole (TERAZOL 3) 0.8 % vaginal cream Place 1 applicator vaginally at bedtime. 20 g 0   No current facility-administered medications on file prior to visit.    Allergies  Allergen Reactions   Beef-Derived Products    Chicken Protein    Fish-Derived Products    Pork-Derived Products     Social History   Socioeconomic History   Marital status: Married    Spouse name: Not on file   Number of children: Not on file   Years of education: Not on file   Highest education level: Not on file  Occupational History   Not on file  Tobacco Use   Smoking status: Never   Smokeless tobacco: Never  Vaping Use   Vaping Use: Never used  Substance and Sexual Activity   Alcohol use: No   Drug use: No   Sexual activity: Not Currently  Other Topics Concern   Not on file  Social History Narrative   Not on file   Social Determinants of Health   Financial Resource Strain: Not on file  Food Insecurity: Not on file  Transportation Needs: Not on file  Physical Activity: Not on file  Stress: Not on file  Social Connections: Not on file  Intimate Partner Violence: Not on file    Family History  Problem Relation Age of Onset   Diabetes Mother    Diabetes Paternal Grandmother    Hypertension Paternal  Grandmother     Past Surgical History:  Procedure Laterality Date   abcess of vulva     APPENDECTOMY      ROS: Review of Systems Negative except as stated above  PHYSICAL EXAM: BP 114/79 (BP Location: Left Arm, Patient Position: Sitting, Cuff Size: Normal)   Pulse 81   Temp 98.3 F (36.8 C) (Oral)   Ht 5' 4"$  (1.626 m)   Wt 212 lb (96.2 kg)   SpO2 100%   BMI 36.39 kg/m   Wt Readings from Last 3 Encounters:  12/14/22 212 lb (96.2 kg)  11/01/22 216 lb (98 kg)  07/22/22 207 lb 10.8 oz (94.2 kg)    Physical Exam  General appearance - alert, well appearing, and in no distress Mental status - normal mood, behavior, speech, dress, motor activity, and thought  processes Musculoskeletal -ankles: She has mild nonpitting edema of the soft tissue over the lateral malleolus bilaterally right greater than left.  No point tenderness on palpation of the ankle joints.  She has good range of motion of both joints.      Latest Ref Rng & Units 07/22/2022    7:51 PM 03/12/2022    4:22 PM 09/20/2021    9:03 AM  CMP  Glucose 70 - 99 mg/dL 103  88  86   BUN 6 - 20 mg/dL 9  <5  8   Creatinine 0.44 - 1.00 mg/dL 0.71  0.85  0.61   Sodium 135 - 145 mmol/L 139  138  139   Potassium 3.5 - 5.1 mmol/L 3.4  4.1  3.9   Chloride 98 - 111 mmol/L 105  108  104   CO2 22 - 32 mmol/L 23  25  20   $ Calcium 8.9 - 10.3 mg/dL 9.3  8.6  9.1   Total Protein 6.5 - 8.1 g/dL  6.3  7.0   Total Bilirubin 0.3 - 1.2 mg/dL  0.3  <0.2   Alkaline Phos 38 - 126 U/L  124  91   AST 15 - 41 U/L  25  18   ALT 0 - 44 U/L  26  25    Lipid Panel  No results found for: "CHOL", "TRIG", "HDL", "CHOLHDL", "VLDL", "LDLCALC", "LDLDIRECT"  CBC    Component Value Date/Time   WBC 8.3 07/22/2022 1951   RBC 4.51 07/22/2022 1951   HGB 12.3 07/22/2022 1951   HGB 10.9 (L) 01/15/2022 1107   HCT 36.6 07/22/2022 1951   HCT 32.4 (L) 01/15/2022 1107   PLT 320 07/22/2022 1951   PLT 238 01/15/2022 1107   MCV 81.2 07/22/2022 1951   MCV 76 (L) 01/15/2022 1107   MCH 27.3 07/22/2022 1951   MCHC 33.6 07/22/2022 1951   RDW 12.9 07/22/2022 1951   RDW 14.7 01/15/2022 1107   LYMPHSABS 1.7 09/20/2021 0903   EOSABS 0.1 09/20/2021 0903   BASOSABS 0.0 09/20/2021 0903    ASSESSMENT AND PLAN:  1. Gastroesophageal reflux disease without esophagitis Doing well on Prilosec.  She will continue the Prilosec  2. Obesity (BMI 35.0-39.9 without comorbidity) Commended her on regular exercise and encouraged her to continue. Discussed and encourage healthy eating habits.  Printed information also provided in her language.  She is down 4 pounds so far since last visit.  3. Acute vaginitis We will have her do a vaginal  swab today - Cervicovaginal ancillary only  4. Ankle swelling, right I do not think this is of any danger to her.  I suspect she  always has had fullness of soft tissue over both lateral malleolus that becomes more pronounced during exercise. Advise that she use good support tennis shoes when exercising.  Try to wear above the ankle compression socks with exercise.   AMN Language interpreter used during this encounter. # [id number] Patient was given the opportunity to ask questions.  Patient verbalized understanding of the plan and was able to repeat key elements of the plan.   This documentation was completed using Radio producer.  Any transcriptional errors are unintentional.  No orders of the defined types were placed in this encounter.    Requested Prescriptions    No prescriptions requested or ordered in this encounter    Return if symptoms worsen or fail to improve.  Karle Plumber, MD, FACP

## 2022-12-14 NOTE — Patient Instructions (Signed)
??? ????? ???? ??? ????? Heart-Healthy Eating Plan ??? ????? ????? ????? ????? ??? ???????? ???????? ????? ?????? ?????? ????? ????? ????? ?????? ??????? ???????? ????????. ?????? ??? ????? ????? ?????? ???????. ?????? ??? ??? ????? ??? ?????? ??????? ?????? ?? ???? ?? ???? ???????. ???? ????? ?????? ?????? ????? ???? ???? ?? ?????? ??? ??????? ?????? ?????? ??????? ?????? ????? (????????) ?? ??????? ??????????? ?????? ??????? ???? ????? ??????? ?????? ???????. ?? ?? ????? ?? ????? ????? ??????? ?????? ??????? ?? ??? ???:  ????? ?? ??????? ?? ?????? ????? _________% ?? ??? ?? ?????? ??????? ???????? ???? ???????? ??????.  ????? ?? ??????? ?? ?????? ??????? ????? _________% ?? ??? ?? ?????? ??????? ???????? ???? ???????? ??????.  ????? ???? ??????????? ???? ???? ????? ?? ??????? ?????????? ???? ?? _______ ??? ?? ?????.  ????? ???? ???????? ???? ???? ????? ?? ??????? ?????????? ???? ?? _______ ??? ?? ?????. ?? ??????? ???? ?????? ??? ????? ??? ?????? ????? ???? ??? ?????? ???????? ????? ???? ??? ?????. ????? ?????? ????? ?????? ?? ???????? ??????. ??? ????? ?????? ?????? ??????:  ????? ????? ?? ???????.  ????? ???? ?????? ??????? ?? ??????.  ??? ?????? ?? ??? ?? ???. ????? ???????   ?? ????? ??????? ???? ????? ????? ??? ???? ???: ? ???? ??? ???? ?????????? ??????? ???????. ? ???? ??? ???? ??????? ???????. ? ???? ??? ???? ???????? ??? ?????????? ??????? ?? ??????.  ????? ????? ???? ??? 4 ????? ?? ????????? ??????. ?????? ?????? ?? ???????? ????? ????? ?????? (91 ??) ?? ???????? ?? ???? ????????? ?? ?????? ??????? ?????? ?? ???? ???? ??? ?????? ?? ???? ????? ???? ?????? ?? ???? ????? ?????? ?? ???? ????? ????? ??????? ?? ????? (150 ??) ?? ???????? ??????? ??????.  ????? ?? ??? ???? ??? ????? ???? ?? ??????? ??????. ?????? ?????? ?? ??????? ????? ????? ?????? ?? ???? ?????? ?? ??? ???? (237 ??) ?? ??????? ???????? ?? ??????? ?????? ?? ??? ??? (82 ??) ?? ??????? ???????? ?? ??? ???? (240 ??) ?? ????  ??????? ??????.  ????? ?????? ?? ??????? ???????? ??? ????? ????? ???????. ??? ??? ??????? ?????? ????????? ?????? ????????? ???????. ???? ???? ?????? ??? 25-30 ?? ?? ??????? ??????.  ??? ???????? ?? ????????? ????????? ??????? ??? 4-5 ??? ?? ???????. ?????? ????? ??????? ?? ????????? ??????? ?? ????????? ??? ??? (90 ??) ?? ??? ??????? ?????? ?????? ??????? ?? ???????? ????? ??? ????? (12 ??? ???? ?? 24 ??? ????? ?? 7 ????? ?? ?????)? ?????? ??????? ?? ?????? ????? ??? ????? (8 ??). ??????  ???? ?? ?????? ?????? ??????. ???? ?????? ???????? ????????? ??? ???????? ??? ??? ??????? ???? ???????? ???? ????????? ????? ?????? ?????? ?????? ???????.  ????? ?????? ?? ???? ??????-3. ??????? ?????? ????? ???????? ????????? ???????? ??????? ???? ??? ?????? ???? ?????? ???????. ???? ????? ????? ????? ????? ??? ????? ????????.  ???? ?????? ??????? ???????? ?????? ?????? ??????? ???? ????? ??? ???? ?????? ?? ????? ????? ?? ?????? ???????.  ??? ?? ?????? ?????? ???????. ???? ?????? ??????? ?? ???????? ?????????? ??? ?????? ?????? ???????. ????? ??????? ???????? ?????? ??????? ??? ?????? ???? ?? ?????? ???? ??? ?????.  ???? ??????? ???????? ??? ???? ?????? ??????. ??? ???? ????? ??? ???? ??????. ??? ??????? ??? ??? ???????: ??? ?????????? ???? ??????? ?? ???????? ????????? ?????????? ?????? ?? ?????????.  ???? ??????? ???????. ??????? ????  ???? ?? ????? ??????? ???????? ???? ?? ????? ????? ??????? ???????? ???????? ???????.  ??? ?? ????? ????????? ?? ???? ???? ??????.  ??? ?? ??????? ?????? ????? ?????? ?? ????? ????????? ??????? ??? ??????? ???????? ???????? ?????? ?????? ?????? (????? ?????? ????????? ?????????).  ???? ????? ??? ??? ??????. ???? ???? ?????  5-10% ??? ???? ?? ????? ?? ????? ???? ?????? ??????? ?? ?????? ?? ??????? ??? ?????? ?????? ?????.  ????? ?????? ???????? ?? ????? ??????? ????? ??? ??? ????? ?? ?????? ??? ????. ????? ??????? ??????? ?????? ??????? ?? ???? ?? ??????? ?? ????????.  ???? ?????  ?? ??????? ?? ????? ?????? ?? ?? ?????. ?? ??????? ???? ??? ?? ????????? ??????? ???? ????? ??????? ??????? ?? ??????? (?? ???? ?????) ?? ???????. ????????? ???????? ??????? ?? ??????? (?????? ?? ??????? ?? ??????? ?? ???????). ???????? ???????. ?????? ???? ??????. ?? ?????? ????? ?????? ??????? ??????? ?? ???? ?????. ????? ??????????? ??? ?? ??? ????? ????? ?????????? ???????? ?? ????? ??????. ?????? ??????????? ?????? ????? ?????? ???? ????? ???? ??????? ??????? ????? ??????. ???? ?????? ?????? ?????? ?????? ?????. ???? ????? ????? ???????. ???? ????? ???????? ????? ?????? ???? ??????. ???? ????? ?? ????? ????? ?????? ???????????. ?????? ??????? ????????? ?????? ???????. ?????? ????? ????????. ?????? ??????? ?????? ????? ????? ?? ?????? ?????? ??? ?? ??? ??? ???????? ???????????. ?????? ??????? ?? ???? ????? 1% (???? ??? ?????? ?? ??????? ?? ?????? ??? ????? ???). ??? ???? ????? ?? ?????? ???? ?????. ????? ?????? ???? ????? ?? ???? ?????. ?????? ??????? ????????? (????? ???????) ??? ??????? (???? ?? ?????? ????????). ?????? ????????? ????? ??? ?????? ??????? ????? ????? ???????? ?????????? ?????? ???????? ??????? ?????? ????????? ????? ?????. ??? ??????? ?? ????????? ????? ???? ?????. ????????? ?????? (???????? ?? ???????). ?????? ??????. ????? ?????? ??? ??????. ??????? ?????? ????????. ???????? ??????? ???? ?????? ?????????? ??????? ??????? ???????. ????? ????? ?? ?????????? ???????. ??? ?? ??????? ?? ???????? ????. ??????? ????????? ???? ??????? ?????????. ???? ?? ???? ??????? ??????? ????? ??????? ??????? ?? ????????? ?????????? ???? ????? ???????. ????? ??????? ????? ?????? ?????? ?? ????????. ?? ??????? ?????? ??????? ??????? ??????? ??????? ?? ???? ????. ??????? ????? ?????? ?? ???????. ??????? ???????. ??? ?? ??? ?????. ????????? ????????? ???????? ?? ????? ?? ??????? ?? ???? ??????. ????????? ???????. ?????? ????? ????? ???????? ?????? ??????? ?? ????????? ?? ?????? ?? ??????  ??????. ?????????. ??????? ??????. ???????. ????????? ????? ????? ??? ????? ?????? ????? ?????? ???????. ?????? ??????????? ?????? ?????? ??????? ??? ????? ???? ????????? ??????? ???? ??????? ??????? ???? ????? ?????? ?? ????? ?????? ????. ?????? ??????? ????? ?????? ??? ??????? ??????????. ????????. ???? ?????? ????????. ????? ????????? ??? ?????. ?????? ??????? ?????? ??????? ??????? ??????? ??????? ? ??? ???? ???????. ??????? ???????? ?? ???? ???? ?????. ???? ???? ????? ?? ???? 2% (?????? ?? ???????? ?? ????? ??? ?? ?????). ??? ?????? ???? ?????. ????? ??????? ?? ??? ????? ???? ?????. ??????? ??????? ?? ????? ???? ?????. ?????? ??????? ??? ????? ?? ????????? ??????????? (????????). ???? ??????? ??????? ???????? ???? ?????? ? ??? ??? ????? ???? ???? ??????. ?????? ?????? ??????? ??? ?? ??? ???? ??? ??????? ?????? ???? ??????? ?????? ???? ??????? ???????. ????? ?????? ??? ???????? ?? ?????. ??? ?????? ??????? ?? ????? ?? ??????? ???????. ????????? ??????? ?????? ??????? ??? ??????? ????? ??? ?????? ?????. ???????? ??????? ?????? ???????. ???????. ????????. ?????. ???????. ?????????? ??????? ?? ?????????? ???????. ?????? ??????? ??????. ??? ???? ???? ????? ?? ??????? ????? ????. ?? ?? ???? ??????? ??????? ????? ??????? ??????? ?? ????????? ?????????? ???? ????? ???? ??????. ??? ????? ??????? ??????? ?????? ?????? ?? ?????????. ????  ??? ????? ?????? ?????? ????? ???? ???? ?? ?????? ??? ??????? ?????? ?????? ??????? ?????? ????? (????????) ?? ??????? ??????????? ?????? ??????? ???? ????? ??????? ?????? ???????.  ???? ????? ??? ??? ??????. ???? ???? ????? 5-10% ??? ???? ?? ????? ?? ????? ???? ?????? ??????? ?? ?????? ?? ??????? ??? ?????? ?????? ?????.  ??? ??? ????? ???? ??????? ??? ????????? ????????? ??????? ??????? ????? ????? ?? ?????? ?????? ??????????? ????? ?????? ????????? ??????????? ??????? ???????? ??????? ??????? ?????? ?????. ??? ????? ?? ??? ????????? ?? ???? ?????? ????????? ???? ??????  ???? ??????? ??????. ???? ?? ?????? ??? ????? ???? ?? ???? ?? ???? ??????? ??????.?   Document Revised: 12/15/2021 Document Reviewed: 12/15/2021 Elsevier Patient Education  [place].

## 2022-12-17 ENCOUNTER — Other Ambulatory Visit: Payer: Self-pay | Admitting: Internal Medicine

## 2022-12-17 LAB — CERVICOVAGINAL ANCILLARY ONLY
Bacterial Vaginitis (gardnerella): NEGATIVE
Candida Glabrata: NEGATIVE
Candida Vaginitis: POSITIVE — AB
Chlamydia: NEGATIVE
Comment: NEGATIVE
Comment: NEGATIVE
Comment: NEGATIVE
Comment: NEGATIVE
Comment: NEGATIVE
Comment: NORMAL
Neisseria Gonorrhea: NEGATIVE
Trichomonas: NEGATIVE

## 2022-12-17 MED ORDER — MICONAZOLE NITRATE 200 MG VA SUPP
200.0000 mg | Freq: Every day | VAGINAL | 0 refills | Status: DC
  Filled 2022-12-17 – 2023-01-08 (×2): qty 3, 3d supply, fill #0

## 2022-12-18 ENCOUNTER — Other Ambulatory Visit: Payer: Self-pay

## 2022-12-19 ENCOUNTER — Other Ambulatory Visit: Payer: Self-pay

## 2022-12-26 ENCOUNTER — Other Ambulatory Visit: Payer: Self-pay

## 2023-01-08 ENCOUNTER — Other Ambulatory Visit: Payer: Self-pay

## 2023-06-03 ENCOUNTER — Other Ambulatory Visit: Payer: Self-pay | Admitting: Internal Medicine

## 2023-06-03 NOTE — Telephone Encounter (Signed)
Medication Refill - Medication: nystatin triamcinolone 30mg , says was prescribed previously by Dr Laural Benes Pt is request is requesting 2 of these, because she really needs it  Has the patient contacted their pharmacy? yes (Agent: If no, request that the patient contact the pharmacy for the refill. If patient does not wish to contact the pharmacy document the reason why and proceed with request.) (Agent: If yes, when and what did the pharmacy advise?)  Preferred Pharmacy (with phone number or street name):  [id number] [address], [place] [address] Phone: [phone]  Fax: [phone]     Has the patient been seen for an appointment in the last year OR does the patient have an upcoming appointment? yes  Agent: Please be advised that RX refills may take up to 3 business days. We ask that you follow-up with your pharmacy.

## 2023-06-04 NOTE — Telephone Encounter (Signed)
Requested medications are due for refill today.  Unsure  Requested medications are on the active medications list.  no  Last refill. Unsure  Future visit scheduled.   no  Notes to clinic. Rx signed by Catalina Antigua.   Medication Refill - Medication: nystatin triamcinolone 30mg , says was prescribed previously by Dr Laural Benes Pt is request is requesting 2 of these, because she really needs it   Requested Prescriptions  Pending Prescriptions Disp Refills   nystatin-triamcinolone ointment (MYCOLOG) 30 g 0    Sig: Apply 1 Application topically 2 (two) times daily.     Off-Protocol Failed - 06/03/2023  1:49 PM      Failed - Medication not assigned to a protocol, review manually.      Passed - Valid encounter within last 12 months    Recent Outpatient Visits           5 months ago Gastroesophageal reflux disease without esophagitis   [facility] & [facility] Marcine Matar, MD   7 months ago Encounter to establish care   [facility] & [facility] Marcine Matar, MD

## 2023-07-15 ENCOUNTER — Ambulatory Visit: Payer: Medicaid Other | Admitting: Obstetrics and Gynecology

## 2023-08-27 ENCOUNTER — Encounter (HOSPITAL_COMMUNITY): Payer: Self-pay | Admitting: Emergency Medicine

## 2023-08-27 ENCOUNTER — Other Ambulatory Visit: Payer: Self-pay

## 2023-08-27 ENCOUNTER — Emergency Department (HOSPITAL_COMMUNITY)
Admission: EM | Admit: 2023-08-27 | Discharge: 2023-08-27 | Disposition: A | Discharge status: Home / Self Care | Payer: Medicaid Other | Attending: Emergency Medicine | Admitting: Emergency Medicine

## 2023-08-27 DIAGNOSIS — R519 Headache, unspecified: Secondary | ICD-10-CM | POA: Insufficient documentation

## 2023-08-27 DIAGNOSIS — H8112 Benign paroxysmal vertigo, left ear: Secondary | ICD-10-CM | POA: Insufficient documentation

## 2023-08-27 DIAGNOSIS — R112 Nausea with vomiting, unspecified: Secondary | ICD-10-CM | POA: Insufficient documentation

## 2023-08-27 DIAGNOSIS — R42 Dizziness and giddiness: Secondary | ICD-10-CM | POA: Diagnosis present

## 2023-08-27 LAB — CBC
HCT: 35.8 % — ABNORMAL LOW (ref 36.0–46.0)
Hemoglobin: 11.7 g/dL — ABNORMAL LOW (ref 12.0–15.0)
MCH: 27.3 pg (ref 26.0–34.0)
MCHC: 32.7 g/dL (ref 30.0–36.0)
MCV: 83.4 fL (ref 80.0–100.0)
Platelets: 231 10*3/uL (ref 150–400)
RBC: 4.29 MIL/uL (ref 3.87–5.11)
RDW: 13.4 % (ref 11.5–15.5)
WBC: 9 10*3/uL (ref 4.0–10.5)
nRBC: 0 % (ref 0.0–0.2)

## 2023-08-27 LAB — URINALYSIS, ROUTINE W REFLEX MICROSCOPIC
Bilirubin Urine: NEGATIVE
Glucose, UA: NEGATIVE mg/dL
Hgb urine dipstick: NEGATIVE
Ketones, ur: NEGATIVE mg/dL
Leukocytes,Ua: NEGATIVE
Nitrite: NEGATIVE
Protein, ur: NEGATIVE mg/dL
Specific Gravity, Urine: 1.015 (ref 1.005–1.030)
pH: 9 — ABNORMAL HIGH (ref 5.0–8.0)

## 2023-08-27 LAB — COMPREHENSIVE METABOLIC PANEL
ALT: 15 U/L (ref 0–44)
AST: 15 U/L (ref 15–41)
Albumin: 3.1 g/dL — ABNORMAL LOW (ref 3.5–5.0)
Alkaline Phosphatase: 63 U/L (ref 38–126)
Anion gap: 8 (ref 5–15)
BUN: 7 mg/dL (ref 6–20)
CO2: 23 mmol/L (ref 22–32)
Calcium: 8.5 mg/dL — ABNORMAL LOW (ref 8.9–10.3)
Chloride: 108 mmol/L (ref 98–111)
Creatinine, Ser: 0.63 mg/dL (ref 0.44–1.00)
GFR, Estimated: >60 mL/min (ref >60)
Glucose, Bld: 86 mg/dL (ref 70–99)
Potassium: 3.5 mmol/L (ref 3.5–5.1)
Sodium: 139 mmol/L (ref 135–145)
Total Bilirubin: 0.3 mg/dL (ref 0.3–1.2)
Total Protein: 6.8 g/dL (ref 6.5–8.1)

## 2023-08-27 LAB — LIPASE, BLOOD: Lipase: 37 U/L (ref 11–51)

## 2023-08-27 LAB — HCG, SERUM, QUALITATIVE: Preg, Serum: NEGATIVE

## 2023-08-27 MED ORDER — MECLIZINE HCL 25 MG PO TABS: 25.0000 mg | ORAL_TABLET | Freq: Three times a day (TID) | ORAL | 0 refills | Status: DC | PRN

## 2023-08-27 MED ORDER — MECLIZINE HCL 25 MG PO TABS
25.0000 mg | ORAL_TABLET | Freq: Once | ORAL | Status: AC
  Administered 2023-08-27: 25 mg via ORAL
  Filled 2023-08-27: qty 1

## 2023-08-27 MED ORDER — ONDANSETRON 4 MG PO TBDP
4.0000 mg | ORAL_TABLET | Freq: Once | ORAL | Status: AC | PRN
  Administered 2023-08-27: 4 mg via ORAL
  Filled 2023-08-27: qty 1

## 2023-08-27 NOTE — ED Triage Notes (Addendum)
Pt states yesterday at 2130 started with a severe HA with N/V, and pt states it feels like she is unbalanced and has blurry vision.  Pt reports 1 month ago, had 1 episode of body feeling heavy, with dizziness that resolved- lasting 2 days. Pt states she has BP meds but doesn't take it every day, states last time she had this feeling BP was high and took meds with improvement. Pt states she has been having intermittent dizzy spells lasting 10-15 minutes. PT Aox4.  Reports HA has resolved but dizziness and nausea remain.

## 2023-08-27 NOTE — Discharge Instructions (Signed)
??? ?? ???? ?? ??? ??????? ????? ???? ????? ?? ?????? ???????. ??? ????? ?????? ??????? ?? ?????????? ???? ?? ?????? ?? ???????? ?? ???? ??????. ????? ??? ????? ?? ???? ????? ???? ???????? ?? ????? ?????? ????? ????? ????? ??????? ???? ??????? ????? ????? ??????. ???? ?????? ?????????? ??? ?????? ?????? ??????? ????? ?? ???? ??????? ??????? ?????? ???????. ??? ??? ???? ?? ????? ???? ???????? ??? ??? ??? ???????.     You are seen in the emergency department today with concerns of headache and dizziness.  You had significant improvement with the meclizine that you are given to help your dizziness.  I suspect you likely have benign paroxysmal positional vertigo of the left ear given your observed horizontal nystagmus on physical exam.  I would recommend taking the meclizine as prescribed and following up with your primary care provider for repeat evaluation.  If you have any acute worsening symptoms, return the emergency department.

## 2023-08-27 NOTE — ED Provider Notes (Signed)
[place] EMERGENCY DEPARTMENT AT [facility] Provider Note   CSN: [id number] Arrival date & time: 08/27/23  1120     History Chief Complaint  Patient presents with   Headache   Dizziness    Jacqueline Irwin is a 32 y.o. female.  Patient presents to the emergency department concerns of headache and dizziness.  States that last night around 9:30 PM she "severe headache with associated nausea and vomiting.  Also felt that she had noted some feeling of imbalance and dizziness at that time.  States that she also has intermittent Supple or blurring typical.  Currently feels her headache is somewhat improved compared to last time complaining some nausea with associated dizziness.  Dizziness is primarily worse when she has a postural change from lying down to sitting up.  Denies feelings of lightheadedness or weakness.   Headache Associated symptoms: dizziness   Dizziness Associated symptoms: headaches        Home Medications Prior to Admission medications   Medication Sig Start Date End Date Taking? Authorizing Provider  meclizine (ANTIVERT) 25 MG tablet Take 1 tablet (25 mg total) by mouth 3 (three) times daily as needed for dizziness. 08/27/23  Yes Maryanna Shape A, PA-C  omega-3 acid ethyl esters (LOVAZA) 1 g capsule Take 1 g by mouth daily.   Yes [provider]  VITAMIN D PO Take 1 tablet by mouth daily.   Yes [provider]  miconazole (MICOTIN) 200 MG vaginal suppository Place 1 suppository (200 mg total) vaginally at bedtime. Patient not taking: Reported on 08/27/2023 12/17/22   Marcine Matar, MD  omeprazole (PRILOSEC) 20 MG capsule Take 1 capsule (20 mg total) by mouth 2 (two) times daily before a meal. Patient not taking: Reported on 08/27/2023 11/01/22   Marcine Matar, MD      Allergies    Beef-derived products, Chicken protein, Fish-derived products, and Pork-derived products    Review of Systems   Review of Systems   Neurological:  Positive for dizziness and headaches.  All other systems reviewed and are negative.   Physical Exam Updated Vital Signs BP 124/86   Pulse 68   Temp 97.6 F (36.4 C) (Oral)   Resp 16   Ht 5\' 4"  (1.626 m)   Wt 78 kg   SpO2 100%   BMI 29.52 kg/m  Physical Exam Vitals and nursing note reviewed.  Constitutional:      General: She is not in acute distress.    Appearance: She is well-developed.  HENT:     Head: Normocephalic and atraumatic.  Eyes:     General: No scleral icterus.    Extraocular Movements: Extraocular movements intact.     Right eye: Normal extraocular motion.     Left eye: Normal extraocular motion.     Conjunctiva/sclera: Conjunctivae normal.     Pupils: Pupils are equal, round, and reactive to light. Pupils are equal.     Comments: Horizontal nystagmus when looking towards left side.  Cardiovascular:     Rate and Rhythm: Normal rate and regular rhythm.     Heart sounds: No murmur heard. Pulmonary:     Effort: Pulmonary effort is normal. No respiratory distress.     Breath sounds: Normal breath sounds.  Abdominal:     Palpations: Abdomen is soft.     Tenderness: There is no abdominal tenderness.  Musculoskeletal:        General: No swelling.     Cervical back: Neck supple.  Skin:    General: Skin is warm and dry.     Capillary Refill: Capillary refill takes less than 2 seconds.  Neurological:     Mental Status: She is alert.     Cranial Nerves: No cranial nerve deficit or facial asymmetry.     Comments: Horizontal nystagmus when looking towards left with EOM testing.  Psychiatric:        Mood and Affect: Mood normal.     ED Results / Procedures / Treatments   Labs (all labs ordered are listed, but only abnormal results are displayed) Labs Reviewed  COMPREHENSIVE METABOLIC PANEL - Abnormal; Notable for the following components:      Result Value   Calcium 8.5 (*)    Albumin 3.1 (*)    All other components within normal limits   CBC - Abnormal; Notable for the following components:   Hemoglobin 11.7 (*)    HCT 35.8 (*)    All other components within normal limits  URINALYSIS, ROUTINE W REFLEX MICROSCOPIC - Abnormal; Notable for the following components:   pH 9.0 (*)    All other components within normal limits  LIPASE, BLOOD  HCG, SERUM, QUALITATIVE    EKG EKG Interpretation Date/Time:  Tuesday August 27 2023 12:00:41 EDT Ventricular Rate:  84 PR Interval:  168 QRS Duration:  74 QT Interval:  400 QTC Calculation: 472 R Axis:   56  Text Interpretation: Normal sinus rhythm ST-t wave abnormality When compared with ECG of 22-Jul-2022 19:37, PREVIOUS ECG IS PRESENT No significant change since last tracing Confirmed by Susy Frizzle [phone]) on 08/28/2023 10:41:19 AM  Radiology No results found.  Procedures Procedures   Medications Ordered in ED Medications  ondansetron (ZOFRAN-ODT) disintegrating tablet 4 mg (4 mg Oral Given 08/27/23 1142)  meclizine (ANTIVERT) tablet 25 mg (25 mg Oral Given 08/27/23 1420)    ED Course/ Medical Decision Making/ A&P Clinical Course as of 08/29/23 2331  Tue Aug 27, 2023  1530 Urinalysis, Routine w reflex microscopic -Urine, Clean Catch(!) [OZ]    Clinical Course User Index [OZ] Smitty Knudsen, PA-C                               Medical Decision Making Amount and/or Complexity of Data Reviewed Labs: ordered. Decision-making details documented in ED Course. Radiology: ordered.  Risk Prescription drug management.   This patient presents to the ED for concern of headache, dizziness. Differential diagnosis includes migraine, vertigo, meniere's disease, stroke, labyrinthitis    Lab Tests:  I Ordered, and personally interpreted labs.  The pertinent results include:  CBC with slightly decreased hemoglobin and hematocrit, CMP with hypocalcemia at 8.5 and hypoalbuminemia at 3.1, lipase normal at 37, hcg negative, UA without signs of infection    Medicines  ordered and prescription drug management:  I ordered medication including Zofran, meclizine for nausea, dizziness  Reevaluation of the patient after these medicines showed that the patient improved I have reviewed the patients home medicines and have made adjustments as needed   Problem List / ED Course:  Patient presents to the ED with concerns of headache, nausea, vomiting, and dizziness. About 1 month ago, had episode of similar symptoms with body "feeling heavy" and dizziness that lasted for about 2 days intermittently. Currently feels that the room is spinning and notably has worsening of symptoms when going from reclined in bed to sitting up. Trying to keep eyes closed as this helps with  room spinning feeling. States when she stays still and keeps eyes closed, all of her symptoms improve. Has had some episodes of dizziness in the past, but typically self-limiting and resolve without any intervention. Denies recent fevers, chest pain, cough, congestion, shortness of breath, or abdominal pain. No sick contacts. Basic labs ordered and collected from triage including CBC, CMP, lipase, UA, and hcg. All unremarkable. On exam, no acute neurological deficits noted. EOMs intact, but notable horizontal nystagmus towards left when assessing EOMs. This nystagmus coincided with room-spinning sensation and nausea but no vomiting. Given findings, ordered single dose of meclizine on top of Zofran ODT given to patient earlier from triage. Do not anticipate need for head imaging at this time. Ears unremarkable for signs of infection or middle ear effusion. About 1 hour after administration of meclizine, patient reports feeling of vertigo about 70-80% resolved with only minimal symptoms noted and nausea no longer present. Given exam findings and positive response to meclizine, suspect BPPV of the left ear. Advised patient to take meclizine at home as needed for continued dizziness and to follow up with PCP for  reassessment to ensure symptoms resolving. Discussed possible need for ENT evaluation if symptoms persist. Strict return precautions given to patient. Patient discharged home in stable condition.   Final Clinical Impression(s) / ED Diagnoses Final diagnoses:  Benign paroxysmal positional vertigo of left ear    Rx / DC Orders ED Discharge Orders          Ordered    meclizine (ANTIVERT) 25 MG tablet  3 times daily PRN        08/27/23 1536              Smitty Knudsen, PA-C 08/29/23 2332    Rondel Baton, MD 08/31/23 [phone]

## 2023-10-08 ENCOUNTER — Other Ambulatory Visit (HOSPITAL_COMMUNITY): Payer: Self-pay

## 2024-01-08 ENCOUNTER — Emergency Department (HOSPITAL_COMMUNITY): Admission: EM | Admit: 2024-01-08 | Discharge: 2024-01-08 | Disposition: A | Discharge status: Home / Self Care

## 2024-01-08 ENCOUNTER — Encounter (HOSPITAL_COMMUNITY): Payer: Self-pay

## 2024-01-08 ENCOUNTER — Other Ambulatory Visit: Payer: Self-pay

## 2024-01-08 DIAGNOSIS — R1013 Epigastric pain: Secondary | ICD-10-CM | POA: Diagnosis present

## 2024-01-08 DIAGNOSIS — R112 Nausea with vomiting, unspecified: Secondary | ICD-10-CM | POA: Insufficient documentation

## 2024-01-08 DIAGNOSIS — K2900 Acute gastritis without bleeding: Secondary | ICD-10-CM

## 2024-01-08 LAB — CBC
HCT: 36.7 % (ref 36.0–46.0)
Hemoglobin: 12.5 g/dL (ref 12.0–15.0)
MCH: 27.6 pg (ref 26.0–34.0)
MCHC: 34.1 g/dL (ref 30.0–36.0)
MCV: 81 fL (ref 80.0–100.0)
Platelets: 327 10*3/uL (ref 150–400)
RBC: 4.53 MIL/uL (ref 3.87–5.11)
RDW: 13.1 % (ref 11.5–15.5)
WBC: 13.8 10*3/uL — ABNORMAL HIGH (ref 4.0–10.5)
nRBC: 0 % (ref 0.0–0.2)

## 2024-01-08 LAB — URINALYSIS, ROUTINE W REFLEX MICROSCOPIC
Bilirubin Urine: NEGATIVE
Glucose, UA: NEGATIVE mg/dL
Hgb urine dipstick: NEGATIVE
Ketones, ur: 20 mg/dL — AB
Leukocytes,Ua: NEGATIVE
Nitrite: NEGATIVE
Protein, ur: NEGATIVE mg/dL
Specific Gravity, Urine: 1.014 (ref 1.005–1.030)
pH: 5 (ref 5.0–8.0)

## 2024-01-08 LAB — COMPREHENSIVE METABOLIC PANEL
ALT: 19 U/L (ref 0–44)
AST: 19 U/L (ref 15–41)
Albumin: 3.5 g/dL (ref 3.5–5.0)
Alkaline Phosphatase: 54 U/L (ref 38–126)
Anion gap: 14 (ref 5–15)
BUN: 15 mg/dL (ref 6–20)
CO2: 22 mmol/L (ref 22–32)
Calcium: 9.6 mg/dL (ref 8.9–10.3)
Chloride: 103 mmol/L (ref 98–111)
Creatinine, Ser: 0.8 mg/dL (ref 0.44–1.00)
GFR, Estimated: >60 mL/min (ref >60)
Glucose, Bld: 106 mg/dL — ABNORMAL HIGH (ref 70–99)
Potassium: 3.8 mmol/L (ref 3.5–5.1)
Sodium: 139 mmol/L (ref 135–145)
Total Bilirubin: 0.7 mg/dL (ref 0.0–1.2)
Total Protein: 7.8 g/dL (ref 6.5–8.1)

## 2024-01-08 LAB — RESP PANEL BY RT-PCR (RSV, FLU A&B, COVID)  RVPGX2
Influenza A by PCR: NEGATIVE
Influenza B by PCR: NEGATIVE
Resp Syncytial Virus by PCR: NEGATIVE
SARS Coronavirus 2 by RT PCR: NEGATIVE

## 2024-01-08 LAB — LIPASE, BLOOD: Lipase: 44 U/L (ref 11–51)

## 2024-01-08 LAB — TROPONIN I (HIGH SENSITIVITY)
Troponin I (High Sensitivity): 7 ng/L (ref <18)
Troponin I (High Sensitivity): 6 ng/L (ref <18)

## 2024-01-08 LAB — HCG, SERUM, QUALITATIVE: Preg, Serum: NEGATIVE

## 2024-01-08 MED ORDER — ACETAMINOPHEN 500 MG PO TABS: 1000.0000 mg | ORAL_TABLET | Freq: Once | ORAL | Status: DC

## 2024-01-08 MED ORDER — SUCRALFATE 1 G PO TABS: 1.0000 g | ORAL_TABLET | Freq: Two times a day (BID) | ORAL | 0 refills | Status: DC

## 2024-01-08 MED ORDER — ONDANSETRON 4 MG PO TBDP
4.0000 mg | ORAL_TABLET | Freq: Once | ORAL | Status: AC | PRN
  Administered 2024-01-08: 4 mg via ORAL
  Filled 2024-01-08: qty 1

## 2024-01-08 MED ORDER — ACETAMINOPHEN 500 MG PO TABS
ORAL_TABLET | ORAL | Status: AC
  Filled 2024-01-08: qty 2

## 2024-01-08 MED ORDER — ALUM & MAG HYDROXIDE-SIMETH 200-200-20 MG/5ML PO SUSP
15.0000 mL | Freq: Once | ORAL | Status: AC
  Administered 2024-01-08: 15 mL via ORAL
  Filled 2024-01-08: qty 30

## 2024-01-08 MED ORDER — PANTOPRAZOLE SODIUM 40 MG PO TBEC
40.0000 mg | DELAYED_RELEASE_TABLET | Freq: Every day | ORAL | 0 refills | Status: DC
Start: 2024-01-08 — End: 2024-03-12

## 2024-01-08 MED ORDER — ONDANSETRON 4 MG PO TBDP: 4.0000 mg | ORAL_TABLET | Freq: Three times a day (TID) | ORAL | 0 refills | Status: DC | PRN

## 2024-01-08 NOTE — ED Provider Notes (Signed)
 [place] EMERGENCY DEPARTMENT AT [facility] Provider Note   CSN: [id number] Arrival date & time: 01/08/24  7829     History  Chief Complaint  Patient presents with   Abdominal Pain   Chest Pain    Jacqueline Irwin is a 33 y.o. female.  This is a 33 year old female presenting emergency department for epigastric abdominal pain with nausea vomiting x 3 days.  Reports burning sensation radiating up into her chest.  No sick contacts.  Did have some streaks of blood in her vomitus this morning which concerned her.  Has a history of GERD.  No prior abdominal surgeries.  Is feeling improved after receiving GI cocktail and Zofran.   Abdominal Pain Associated symptoms: chest pain   Chest Pain Associated symptoms: abdominal pain        Home Medications Prior to Admission medications   Medication Sig Start Date End Date Taking? Authorizing Provider  meclizine (ANTIVERT) 25 MG tablet Take 1 tablet (25 mg total) by mouth 3 (three) times daily as needed for dizziness. 08/27/23   Smitty Knudsen, PA-C  miconazole (MICOTIN) 200 MG vaginal suppository Place 1 suppository (200 mg total) vaginally at bedtime. Patient not taking: Reported on 08/27/2023 12/17/22   Marcine Matar, MD  omega-3 acid ethyl esters (LOVAZA) 1 g capsule Take 1 g by mouth daily.    [provider]  omeprazole (PRILOSEC) 20 MG capsule Take 1 capsule (20 mg total) by mouth 2 (two) times daily before a meal. Patient not taking: Reported on 08/27/2023 11/01/22   Marcine Matar, MD  VITAMIN D PO Take 1 tablet by mouth daily.    [provider]      Allergies    Beef-derived drug products, Chicken protein, Fish-derived products, and Pork-derived products    Review of Systems   Review of Systems  Cardiovascular:  Positive for chest pain.  Gastrointestinal:  Positive for abdominal pain.    Physical Exam Updated Vital Signs BP 111/75 (BP Location: Left Arm)   Pulse 86    Temp 97.9 F (36.6 C)   Resp 18   Ht 5\' 4"  (1.626 m)   Wt 77.6 kg   SpO2 100%   BMI 29.35 kg/m  Physical Exam Vitals and nursing note reviewed.  Constitutional:      General: She is not in acute distress.    Appearance: She is not toxic-appearing.  HENT:     Head: Normocephalic.  Cardiovascular:     Rate and Rhythm: Normal rate and regular rhythm.  Pulmonary:     Effort: Pulmonary effort is normal.     Breath sounds: Normal breath sounds.  Abdominal:     General: Abdomen is flat.     Palpations: Abdomen is soft.     Tenderness: There is no abdominal tenderness.  Neurological:     General: No focal deficit present.     Mental Status: She is alert.  Psychiatric:        Mood and Affect: Mood normal.        Behavior: Behavior normal.     ED Results / Procedures / Treatments   Labs (all labs ordered are listed, but only abnormal results are displayed) Labs Reviewed  COMPREHENSIVE METABOLIC PANEL - Abnormal; Notable for the following components:      Result Value   Glucose, Bld 106 (*)    All other components within normal limits  CBC - Abnormal; Notable for the following components:   WBC  13.8 (*)    All other components within normal limits  URINALYSIS, ROUTINE W REFLEX MICROSCOPIC - Abnormal; Notable for the following components:   Ketones, ur 20 (*)    All other components within normal limits  RESP PANEL BY RT-PCR (RSV, FLU A&B, COVID)  RVPGX2  LIPASE, BLOOD  HCG, SERUM, QUALITATIVE  TROPONIN I (HIGH SENSITIVITY)  TROPONIN I (HIGH SENSITIVITY)    EKG None  Radiology No results found.  Procedures Procedures    Medications Ordered in ED Medications  ondansetron (ZOFRAN-ODT) disintegrating tablet 4 mg (4 mg Oral Given 01/08/24 0323)  alum & mag hydroxide-simeth (MAALOX/MYLANTA) 200-200-20 MG/5ML suspension 15 mL (15 mLs Oral Given 01/08/24 1610)    ED Course/ Medical Decision Making/ A&P                                 Medical Decision Making This is  a 33 year old female presenting emergency department for epigastric abdominal pain radiating into her chest.  History obtained with aid of interpreter.  She is afebrile, vital signs reassuring.  Benign abdominal exam.  History consistent with gastritis/GERD/peptic ulcer disease picture.  Low suspicion for ACS.  EKG without ST segment changes to indicate ischemia on my independent interpretation.  Troponin negative.  Her pregnancy test is negative.  Hyperemesis gravidarum unlikely.  UA negative for infection.  Pyelonephritis/stone unlikely.  She has no metabolic derangements or kidney function to suggest dehydration.  No transaminitis to suggest hepatobiliary disease.  Lipase is normal.  Pancreatitis unlikely.  Constellation of symptoms concern for possible viral etiology.  However flu/COVID/RSV negative.  Patient is feeling improved after Zofran and GI cocktail.  Will discharge with supportive medications, Sucre fate, Protonix.  Will follow-up with primary doctor.  Will also give referral to GI doctor.  Amount and/or Complexity of Data Reviewed Labs: ordered.  Risk Prescription drug management.         Final Clinical Impression(s) / ED Diagnoses Final diagnoses:  None    Rx / DC Orders ED Discharge Orders     None         Coral Spikes, DO 01/08/24 0920

## 2024-01-08 NOTE — Discharge Instructions (Addendum)
 Please follow-up with your primary doctor.  We are also giving the number to the stomach doctor.  Take the medications for symptomatic relief.  Return immediately for fevers, chills, worsening abdominal pain, worsening chest pain, inability to eat or drink due to nausea vomiting, lightheadedness, passout or any new or worsening symptoms that are concerning to you.

## 2024-01-08 NOTE — ED Triage Notes (Signed)
 Patient complains of epigastric pain and abd pain x 3 days.  Patient then states she has chest pain and left shoulder blade pain. Reports vomited 2 times since arriving.  Patient reports last BM 4 days ago.  Denies pregnancy due to IUD

## 2024-01-08 NOTE — ED Provider Triage Note (Signed)
 Emergency Medicine Provider Triage Evaluation Note  Jacqueline Irwin , a 33 y.o. female  was evaluated in triage.  Pt complains of abdominal pain, nausea and vomiting over the past 4 days.  She reports she has not ate much food, so has not had a bowel movement in this time as well.  She reports mild shortness of breath, but no chest pain.  Denies any fever or chills at home.  Review of Systems  Positive: As above Negative: As above  Physical Exam  BP (!) 120/90 (BP Location: Right Arm)   Pulse (!) 105   Temp (!) 97.5 F (36.4 C)   Resp 18   SpO2 100%  Gen:   Awake, no distress   Resp:  Normal effort  MSK:   Moves extremities without difficulty    Medical Decision Making  Medically screening exam initiated at 3:06 AM.  Appropriate orders placed.  Jacqueline Irwin was informed that the remainder of the evaluation will be completed by another provider, this initial triage assessment does not replace that evaluation, and the importance of remaining in the ED until their evaluation is complete.     Arabella Merles, PA-C 01/08/24 [phone]

## 2024-03-03 ENCOUNTER — Other Ambulatory Visit: Payer: Self-pay | Admitting: Internal Medicine

## 2024-03-04 ENCOUNTER — Ambulatory Visit: Admitting: Obstetrics and Gynecology

## 2024-03-04 ENCOUNTER — Ambulatory Visit: Admitting: Physician Assistant

## 2024-03-04 VITALS — BP 125/82 | HR 84 | Ht 64.0 in | Wt 196.0 lb

## 2024-03-04 DIAGNOSIS — Z30432 Encounter for removal of intrauterine contraceptive device: Secondary | ICD-10-CM | POA: Diagnosis not present

## 2024-03-04 DIAGNOSIS — Z30011 Encounter for initial prescription of contraceptive pills: Secondary | ICD-10-CM

## 2024-03-04 MED ORDER — DROSPIRENONE-ETHINYL ESTRADIOL 3-0.02 MG PO TABS: 1.0000 | ORAL_TABLET | Freq: Every day | ORAL | 11 refills | Status: DC

## 2024-03-04 MED ORDER — PRENATAL 28-0.8 MG PO TABS: 1.0000 | ORAL_TABLET | Freq: Every day | ORAL | 12 refills | Status: DC

## 2024-03-04 NOTE — Progress Notes (Signed)
 Pt presents for IUD removal. IUD placed 04/2022. Pt wants to conceive.   Pt c/o vaginal dryness  Requesting refill of omeprazole  and PNV

## 2024-03-04 NOTE — Progress Notes (Signed)
    GYNECOLOGY OFFICE PROCEDURE NOTE  Jacqueline Irwin is a 33 y.o. [phone] here for [place]  IUD removal. No GYN concerns.  Last pap smear was on 2021 and was normal.  IUD Removal  Patient identified, informed consent performed, consent signed.  Patient was in the dorsal lithotomy position, normal external genitalia was noted.  A speculum was placed in the patient's vagina, normal discharge was noted, no lesions. The cervix was visualized, no lesions, no abnormal discharge.  The strings of the IUD were grasped and pulled using ring forceps. The IUD was removed in its entirety. Patient tolerated the procedure well.    Patient will use OCP for contraception for a few months, plans for pregnancy soon and she was told to avoid teratogens, take PNV and folic acid .  Routine preventative health maintenance measures emphasized.   Susi Eric, FNP [facility] [employer], [facility]

## 2024-03-12 ENCOUNTER — Encounter: Payer: Self-pay | Admitting: Internal Medicine

## 2024-03-12 ENCOUNTER — Ambulatory Visit: Attending: Internal Medicine | Admitting: Internal Medicine

## 2024-03-12 VITALS — BP 110/75 | HR 98 | Ht 64.0 in | Wt 195.0 lb

## 2024-03-12 DIAGNOSIS — R140 Abdominal distension (gaseous): Secondary | ICD-10-CM

## 2024-03-12 DIAGNOSIS — K219 Gastro-esophageal reflux disease without esophagitis: Secondary | ICD-10-CM

## 2024-03-12 DIAGNOSIS — F520 Hypoactive sexual desire disorder: Secondary | ICD-10-CM

## 2024-03-12 MED ORDER — MULTI-VITAMIN/MINERALS PO TABS: 1.0000 | ORAL_TABLET | Freq: Every day | ORAL | 0 refills | Status: DC

## 2024-03-12 MED ORDER — PANTOPRAZOLE SODIUM 40 MG PO TBEC: 40.0000 mg | DELAYED_RELEASE_TABLET | Freq: Every day | ORAL | 2 refills | Status: DC

## 2024-03-12 NOTE — Progress Notes (Signed)
 Patient ID: Jacqueline Irwin, female    DOB: 24-Nov-1990  MRN: [id number]  CC: Follow-up (ER f/u. /Intermittent abdominal pain - possibly due to ulcer/Concern about sex with husband - physical concern)   Subjective: [name] is a 34 y.o. female who presents for chronic ds management. Her concerns today include:  Pt with history of gestational diabetes, obesity, lichen sclerosis, preeclampsia, GERD   [employer] Language interpreter used during this encounter. #Jacqueline Irwin, [id number]   Presents for f/u abdominal pain, seen in ER for same 01/08/2024 and reports being told she has ulcer.  Review of the ER note revealed that she presented with epigastric pain associated with nausea and vomiting x 3 days.  Vitals were stable.  Hemoglobin, electrolytes, lipase level were normal.  Pregnancy test negative.  Given Protonix  which she is out of. Still gets comfort in upper abdomin which she describes as increased gas and bloating mainly when she eats certain foods.  It occurs when she eats beef and any type of legumes/beans.  No problems eating dairy foods.  Tries to avoid foods that she knows would cause heartburn.  Denies any nausea/vomiting/dysphagia.  She has not had any unexplained weight loss.  She is asking for medication to help increase her sex drive.  She and her husband had disagreement and had lived separately in the same house for quite some time.  They have now tried getting back together but she is having issues with sexual arousal.  She thinks emotionally there is a mental block from stress associated with the separation.    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 28 tablet 11   omega-3 acid ethyl esters (LOVAZA) 1 g capsule Take 1 g by mouth daily. (Patient not taking: Reported on 03/04/2024)     ondansetron  (ZOFRAN -ODT) 4 MG disintegrating tablet Take 1 tablet (4 mg total) by mouth every 8 (eight) hours as needed for nausea or vomiting. (Patient not taking: Reported on 03/12/2024) 20 tablet 0   sucralfate  (CARAFATE ) 1 g tablet Take 1 tablet (1 g total) by mouth 2 (two) times daily for 14 days. 28 tablet 0   No current facility-administered medications on file prior to visit.    Allergies  Allergen Reactions   Beef-Derived Drug Products     unknown   Chicken Protein     unknown   Fish-Derived Products     unknown   Pork-Derived Products     unknown    Social History   Socioeconomic History   Marital status: Married    Spouse name: Not on file   Number of children: Not on file   Years of education: Not on file   Highest education level: Not on file  Occupational History   Not on file  Tobacco Use   Smoking status: Never   Smokeless tobacco: Never  Vaping Use   Vaping status: Never Used  Substance and Sexual Activity   Alcohol use: No   Drug use: No   Sexual activity: Not Currently  Other Topics Concern   Not on file  Social History Narrative  Not on file   Social Drivers of Health   Financial Resource Strain: Not on file  Food Insecurity: Not on file  Transportation Needs: Not on file  Physical Activity: Not on file  Stress: Not on file  Social Connections: Not on file  Intimate Partner Violence: Not on file    Family History  Problem Relation Age of Onset   Diabetes Mother    Diabetes Paternal Grandmother    Hypertension Paternal Grandmother     Past Surgical History:  Procedure Laterality Date   abcess of vulva     APPENDECTOMY      ROS: Review of Systems Negative except as stated above  PHYSICAL EXAM: BP 110/75 (BP  Location: Left Arm, Patient Position: Sitting, Cuff Size: Large)   Pulse 98   Ht 5\' 4"  (1.626 m)   Wt 195 lb (88.5 kg)   SpO2 93%   BMI 33.47 kg/m   Wt Readings from Last 3 Encounters:  03/12/24 195 lb (88.5 kg)  03/04/24 195 lb 15.8 oz (88.9 kg)  01/08/24 171 lb (77.6 kg)    Physical Exam  General appearance - alert, well appearing, young female and in no distress Mental status - normal mood, behavior, speech, dress, motor activity, and thought processes Abdomen - soft, nontender, nondistended, no masses or organomegaly      Latest Ref Rng & Units 01/08/2024    3:13 AM 08/27/2023   11:44 AM 07/22/2022    7:51 PM  CMP  Glucose 70 - 99 mg/dL 161  86  096   BUN 6 - 20 mg/dL 15  7  9    Creatinine 0.44 - 1.00 mg/dL 0.45  4.09  8.11   Sodium 135 - 145 mmol/L 139  139  139   Potassium 3.5 - 5.1 mmol/L 3.8  3.5  3.4   Chloride 98 - 111 mmol/L 103  108  105   CO2 22 - 32 mmol/L 22  23  23    Calcium 8.9 - 10.3 mg/dL 9.6  8.5  9.3   Total Protein 6.5 - 8.1 g/dL 7.8  6.8    Total Bilirubin 0.0 - 1.2 mg/dL 0.7  0.3    Alkaline Phos 38 - 126 U/L 54  63    AST 15 - 41 U/L 19  15    ALT 0 - 44 U/L 19  15     Lipid Panel  No results found for: "CHOL", "TRIG", "HDL", "CHOLHDL", "VLDL", "LDLCALC", "LDLDIRECT"  CBC    Component Value Date/Time   WBC 13.8 (H) 01/08/2024 0313   RBC 4.53 01/08/2024 0313   HGB 12.5 01/08/2024 0313   HGB 10.9 (L) 01/15/2022 1107   HCT 36.7 01/08/2024 0313   HCT 32.4 (L) 01/15/2022 1107   PLT 327 01/08/2024 0313   PLT 238 01/15/2022 1107   MCV 81.0 01/08/2024 0313   MCV 76 (L) 01/15/2022 1107   MCH 27.6 01/08/2024 0313   MCHC 34.1 01/08/2024 0313   RDW 13.1 01/08/2024 0313   RDW 14.7 01/15/2022 1107   LYMPHSABS 1.7 09/20/2021 0903   EOSABS 0.1 09/20/2021 0903   BASOSABS 0.0 09/20/2021 0903    ASSESSMENT AND PLAN: 1. Abdominal bloating (Primary) Associated with certain foods.  Since it occurs with beef, I recommend that she try to cut back on  eating red meat and try to eat more lean meat like chicken [place] seafood.  Legumes are good plant-based foods but they cause gassiness for her.  Advised eating and small  portions  2. Gastroesophageal reflux disease without esophagitis GERD precautions discussed.  Advised to avoid certain foods like spicy foods, tomato-based foods, juices and excessive caffeine.  Advised to eat his last meal at least 2 to 3 hours before laying down at nights and to sleep with his head slightly elevated.    - pantoprazole  (PROTONIX ) 40 MG tablet; Take 1 tablet (40 mg total) by mouth daily.  Dispense: 30 tablet; Refill: 2  3. Decreased sexual desire Sounds more like a psychological block probably due from stressful relationship.  Recommend she considers seeing a therapist or they both seeing a therapist together.   - Try not to initiate sexual encounter when feeling stressed.  Discussed with her partner need for adequate foreplay.    Patient was given the opportunity to ask questions.  Patient verbalized understanding of the plan and was able to repeat key elements of the plan.   This documentation was completed using Paediatric nurse.  Any transcriptional errors are unintentional.  No orders of the defined types were placed in this encounter.    Requested Prescriptions   Signed Prescriptions Disp Refills   pantoprazole  (PROTONIX ) 40 MG tablet 30 tablet 2    Sig: Take 1 tablet (40 mg total) by mouth daily.   Multiple Vitamins-Minerals (MULTIVITAMIN WITH MINERALS) tablet 100 tablet 0    Sig: Take 1 tablet by mouth daily.    No follow-ups on file.  Concetta Dee, MD, FACP

## 2024-05-04 ENCOUNTER — Other Ambulatory Visit (HOSPITAL_COMMUNITY)
Admission: RE | Admit: 2024-05-04 | Discharge: 2024-05-04 | Disposition: A | Discharge status: Home / Self Care | Source: Ambulatory Visit | Attending: Obstetrics and Gynecology | Admitting: Obstetrics and Gynecology

## 2024-05-04 ENCOUNTER — Ambulatory Visit: Admitting: Obstetrics and Gynecology

## 2024-05-04 ENCOUNTER — Encounter: Payer: Self-pay | Admitting: Obstetrics and Gynecology

## 2024-05-04 VITALS — BP 105/72 | HR 91 | Ht 64.25 in | Wt 203.0 lb

## 2024-05-04 DIAGNOSIS — Z01419 Encounter for gynecological examination (general) (routine) without abnormal findings: Secondary | ICD-10-CM | POA: Insufficient documentation

## 2024-05-04 DIAGNOSIS — Z23 Encounter for immunization: Secondary | ICD-10-CM

## 2024-05-04 DIAGNOSIS — Z3041 Encounter for surveillance of contraceptive pills: Secondary | ICD-10-CM | POA: Diagnosis not present

## 2024-05-04 DIAGNOSIS — Z1331 Encounter for screening for depression: Secondary | ICD-10-CM

## 2024-05-04 MED ORDER — DROSPIRENONE-ETHINYL ESTRADIOL 3-0.02 MG PO TABS
1.0000 | ORAL_TABLET | Freq: Every day | ORAL | 11 refills | Status: AC
Start: 2024-05-04 — End: ?

## 2024-05-04 NOTE — Progress Notes (Addendum)
 ANNUAL EXAM Patient name: Jacqueline Irwin MRN [id number]  Date of birth: [birth year] Chief Complaint:   Gynecologic Exam  History of Present Illness:   Jacqueline Irwin is a 33 y.o. [phone] with Patient's last menstrual period was 04/21/2024. being seen today for a routine annual exam.  Current complaints: None   Upstream - 05/04/24 1118       Pregnancy Intention Screening   Does the patient want to become pregnant in the next year? No    Would the patient like to discuss contraceptive options today? No      Contraception Wrap Up   Current Method Oral Contraceptive    End Method Oral Contraceptive    Contraception Counseling Provided No    How was the end contraceptive method provided? Prescription         The pregnancy intention screening data noted above was reviewed. Potential methods of contraception were discussed. The patient elected to proceed with Oral Contraceptive.   Last pap 04/27/20. Results were: NILM w/ HRHPV negative. H/O abnormal pap: no Last mammogram: n/a. Family h/o breast cancer: no Last colonoscopy: n/a. Family h/o colorectal cancer: no HPV vaccine: has not received     05/04/2024   10:52 AM 03/12/2024    4:52 PM 03/12/2024    4:44 PM 12/14/2022   10:12 AM 11/01/2022    9:14 AM  Depression screen PHQ 2/9  Decreased Interest 0 0 0 0 0  Down, Depressed, Hopeless 0 0 0 0 0  PHQ - 2 Score 0 0 0 0 0  Altered sleeping 0 0 0 0 0  Tired, decreased energy 0 0 0 0 0  Change in appetite 0 0 0 0 0  Feeling bad or failure about yourself  0 0 0 0 0  Trouble concentrating 0 0 0 0 0  Moving slowly or fidgety/restless 0 0 0 0 0  Suicidal thoughts 0 0 0 0 0  PHQ-9 Score 0 0 0 0 0  Difficult doing work/chores Not difficult at all Not difficult at all Not difficult at all          05/04/2024   10:52 AM 03/12/2024    4:44 PM 12/14/2022   10:12 AM 11/01/2022    9:14 AM  GAD 7 : Generalized Anxiety Score  Nervous, Anxious, on Edge 0 0 0 0   Control/stop worrying 0 0 0 0  Worry too much - different things 0 0 0 0  Trouble relaxing 0 0 0 0  Restless 0 0 0 0  Easily annoyed or irritable 0 0 0 0  Afraid - awful might happen 0 0 0 0  Total GAD 7 Score 0 0 0 0  Anxiety Difficulty Not difficult at all Not difficult at all       Review of Systems:   Pertinent items are noted in HPI Denies any headaches, blurred vision, fatigue, shortness of breath, chest pain, abdominal pain, abnormal vaginal discharge/itching/odor/irritation, problems with periods, bowel movements, urination, or intercourse unless otherwise stated above. Pertinent History Reviewed:  Reviewed past medical,surgical, social and family history.  Reviewed problem list, medications and allergies. Physical Assessment:   Vitals:   05/04/24 1040  BP: 105/72  Pulse: 91  Weight: 203 lb (92.1 kg)  Height: 5' 4.25 (1.632 m)  Body mass index is 34.57 kg/m.        Physical Examination:   General appearance - well appearing, and in no distress  Mental status - alert, oriented to  person, place, and time  Chest - respiratory effort normal  Heart - normal peripheral perfusion  Breasts - breasts appear normal, no suspicious masses, no skin or nipple changes or axillary nodes  Abdomen - soft, nontender, nondistended, no masses or organomegaly  Pelvic - VULVA: normal appearing vulva with no masses, tenderness or lesions    Chaperone present for exam  No results found for this or any previous visit (from the past 24 hours).  Assessment & Plan:  1) Well-Woman Exam Mammogram: @ 35yo, or sooner if problems Colonoscopy: @ 35yo, or sooner if problems Pap: Due 2026 Gardasil: First dose given today GC/CT: Collected HIV/HCV: Ordered -     Cervicovaginal ancillary only( [facility]) -     Hepatitis C Antibody -     Hepatitis B Surface AntiGEN -     RPR -     HIV antibody (with reflex)  2) Encounter for surveillance of contraceptive pills -     drospirenone -ethinyl  estradiol  (YAZ) 3-0.02 MG tablet; Take 1 tablet by mouth daily.  Labs/procedures today:   Orders Placed This Encounter  Procedures   Hepatitis C Antibody   Hepatitis B Surface AntiGEN   RPR   HIV antibody (with reflex)   Meds:  Meds ordered this encounter  Medications   drospirenone -ethinyl estradiol  (YAZ) 3-0.02 MG tablet    Sig: Take 1 tablet by mouth daily.    Dispense:  28 tablet    Refill:  11   Follow-up: Return in about 8 weeks (around 06/29/2024) for RN visit for gardasil #2.  Kieth JAYSON Carolin, MD 05/04/2024 11:32 AM

## 2024-05-04 NOTE — Patient Instructions (Signed)
 It was nice meeting you today. You will see your results in the MyChart app within 1 week.

## 2024-05-04 NOTE — Addendum Note (Signed)
 Addended by: JERRYE AREA D on: 05/04/2024 11:43 AM   Modules accepted: Orders

## 2024-05-04 NOTE — Progress Notes (Signed)
 33 y.o. GYN presents for AEX/PAP/STD screening.  Needs refills on BC.

## 2024-05-05 LAB — CERVICOVAGINAL ANCILLARY ONLY
Chlamydia: NEGATIVE
Comment: NEGATIVE
Comment: NEGATIVE
Comment: NORMAL
Neisseria Gonorrhea: NEGATIVE
Trichomonas: NEGATIVE

## 2024-05-05 LAB — RPR: RPR Ser Ql: NONREACTIVE

## 2024-05-05 LAB — HIV ANTIBODY (ROUTINE TESTING W REFLEX): HIV Screen 4th Generation wRfx: NONREACTIVE

## 2024-05-05 LAB — HEPATITIS B SURFACE ANTIGEN: Hepatitis B Surface Ag: NEGATIVE

## 2024-05-05 LAB — HEPATITIS C ANTIBODY: Hep C Virus Ab: NONREACTIVE

## 2024-05-06 ENCOUNTER — Ambulatory Visit: Payer: Self-pay | Admitting: Obstetrics and Gynecology

## 2024-06-29 ENCOUNTER — Ambulatory Visit

## 2024-10-09 ENCOUNTER — Ambulatory Visit: Payer: Self-pay

## 2024-10-09 NOTE — Telephone Encounter (Signed)
 FYI Only or Action Required?: FYI only for provider: no appointments, proceeding to urgent care.  Patient was last seen in primary care on 03/12/2024 by Vicci Barnie NOVAK, MD.  Called Nurse Triage reporting Nasal Congestion.  Symptoms began several months ago. worsening for several days  Interventions attempted: Rest, hydration, or home remedies.  Symptoms are: gradually worsening.  Triage Disposition: See PCP When Office is Open (Within 3 Days)  Patient/caregiver understands and will follow disposition?: Yes    Copied from CRM #[id number]. Topic: Clinical - Red Word Triage >> Oct 09, 2024 10:07 AM [name] wrote: Red Word that prompted transfer to Nurse Triage: Patient stated she has trauma in her nose. She feels congested and pain for 3 months. Reason for Disposition  [1] Sinus congestion (pressure, fullness) AND [2] present > 10 days  Answer Assessment - Initial Assessment Questions 1. LOCATION: Where does it hurt?      Nasal  2. ONSET: When did the sinus pain start?  (e.g., hours, days)      Inside nasal passage 3. SEVERITY: How bad is the pain?   (Scale 0-10; or none, mild, moderate or severe)      4. RECURRENT SYMPTOM: Have you ever had sinus problems before? If Yes, ask: When was the last time? and What happened that time?       5. NASAL CONGESTION: Is the nose blocked? If Yes, ask: Can you open it or must you breathe through your mouth?     Congested  6. NASAL DISCHARGE: Do you have discharge from your nose? If so ask, What color?     Clear has some blood when she blows her nose  7. FEVER: Do you have a fever? If Yes, ask: What is it, how was it measured, and when did it start?      Denies  8. OTHER SYMPTOMS: Do you have any other symptoms? (e.g., sore throat, cough, earache, difficulty breathing)     Unable to smell 9. PREGNANCY: Is there any chance you are pregnant? When was your last menstrual period?  Protocols used: Sinus Pain or  Congestion-A-AH

## 2024-10-09 NOTE — Telephone Encounter (Signed)
 noted

## 2024-10-19 ENCOUNTER — Ambulatory Visit: Payer: Self-pay

## 2024-10-19 NOTE — Telephone Encounter (Signed)
 FYI Only or Action Required?: Action required by provider: request for appointment, referral request, and clinical question for provider.  Patient was last seen in primary care on 03/12/2024 by Vicci Barnie NOVAK, MD.  Called Nurse Triage reporting Facial Swelling.  Symptoms began several months ago.  Interventions attempted: Nothing.  Symptoms are: unchanged.  Triage Disposition: See Physician Within 24 Hours  Patient/caregiver understands and will follow disposition?: Yes   Copied from CRM #[id number]. Topic: Clinical - Red Word Triage >> Oct 19, 2024  1:51 PM Shardie S wrote: [facility] that prompted transfer to Nurse Triage: Nasal congestion and swelling- worsening Reason for Disposition  [1] Sinus pain (not just congestion) AND [2] fever  Answer Assessment - Initial Assessment Questions No available appts pcp/clinic.   Advised UC today and ED/911 if symptoms worsen. Patient verbalized understanding.  Nurse provided UC at [facility] information.  Patient requesting appt with PCP and referral for ENT.   1. LOCATION: Where does it hurt?      Nasal congestion 2. ONSET: When did the sinus pain start?  (e.g., hours, days)      4months 3. SEVERITY: How bad is the pain?   (Scale 0-10; or none, mild, moderate or severe) no 5. NASAL CONGESTION: Is the nose blocked? If Yes, ask: Can you open it or must you breathe through your mouth?     No; mouth breathing, can't smell or taste anything 6. NASAL DISCHARGE: Do you have discharge from your nose? If so ask, What color?     No; sometimes,clear; yellowish 7. FEVER: Do you have a fever? If Yes, ask: What is it, how was it measured, and when did it start?      Denies fever chills n/v 8. OTHER SYMPTOMS: Do you have any other symptoms? (e.g., sore throat, cough, earache, difficulty breathing)     Denies diff breath, ha, cough, sore throat, earache Mouth feels dry  Protocols used: Sinus Pain or Congestion-A-AH

## 2024-10-19 NOTE — Telephone Encounter (Signed)
 Please reach out to patient and schedule with any provider.

## 2024-10-20 ENCOUNTER — Encounter: Payer: Self-pay | Admitting: Internal Medicine

## 2024-10-20 ENCOUNTER — Ambulatory Visit: Payer: Self-pay | Attending: Internal Medicine | Admitting: Internal Medicine

## 2024-10-20 VITALS — BP 100/70 | HR 103 | Temp 98.2°F | Ht 64.0 in | Wt 202.0 lb

## 2024-10-20 DIAGNOSIS — J329 Chronic sinusitis, unspecified: Secondary | ICD-10-CM

## 2024-10-20 DIAGNOSIS — M7989 Other specified soft tissue disorders: Secondary | ICD-10-CM | POA: Diagnosis not present

## 2024-10-20 DIAGNOSIS — Z23 Encounter for immunization: Secondary | ICD-10-CM

## 2024-10-20 MED ORDER — CLARITIN-D 24 HOUR 10-240 MG PO TB24: 1.0000 | ORAL_TABLET | Freq: Every day | ORAL | 0 refills | Status: AC | PRN

## 2024-10-20 NOTE — Progress Notes (Signed)
 Patient ID: Jacqueline Irwin, female    DOB: 15-Jul-1991  MRN: [id number]  CC: Facial Swelling (Nose swelling X4 mo/Suspects there may be fluid retention in body due to swelling or bilateral arms & abdomen X82mo - feels that there are lumps/Yes to flu vax)   Subjective: Jacqueline Irwin is a 33 y.o. female who presents for chronic ds management. Her concerns today include:  Pt with history of gestational diabetes, obesity, lichen sclerosis, preeclampsia, GERD    [employer] Language interpreter used during this encounter. #[id number]  Discussed the use of AI scribe software for clinical note transcription with the patient, who gave verbal consent to proceed.  History of Present Illness Jacqueline Irwin is a 33 year old female who presents with nasal congestion and intermittent swelling of the arms and abdomen.  She has been experiencing nasal congestion for the past four months, initially attributing it to a common cold. The congestion is characterized by swelling in the nose and difficulty breathing through the nose, leading to mouth breathing. Seen at [facility] 10/11/24 and given 7 day course of Augmentin  for sinusitis. Initial treatment with antibiotics offered temporary relief, but symptoms worsened after two days. Seen at [facility] again yesterday and given Flonase and Afrin nasal sprays which has provided partial relief. The congestion is associated with thick yellow mucus in the mornings, which becomes clearer throughout the day. No sore throat, fever, headache, or pain is reported, but there is a change in her voice due to the congestion. She experiences sneezing episodes occasionally. No itchy throat  She also reports intermittent swelling in her upper arms, thigh muscles BL, and abdomen over the past three months, lasting three to four days before resolving. The swelling is not associated with her menstrual cycle, which is irregular due to contraceptive use. No joint  swelling or pain. The swelling is noticeable through her clothing becoming tighter. She is currently on contraceptive pills to prevent pregnancy.    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     Medications Ordered Prior to Encounter[1]  Allergies[2]  Social History   Socioeconomic History   Marital status: Married    Spouse name: Not on file   Number of children: 4   Years of education: Not on file   Highest education level: Not on file  Occupational History   Not on file  Tobacco Use   Smoking status: Never   Smokeless tobacco: Never  Vaping Use   Vaping status: Never Used  Substance and Sexual Activity   Alcohol use: No   Drug use: No   Sexual activity: Yes    Birth control/protection: Pill  Other Topics Concern   Not on file  Social History Narrative   Not on file   Social Drivers of Health   Tobacco Use: Low Risk (10/19/2024)   Received from [facility]   Patient History    Smoking Tobacco Use: Never    Smokeless Tobacco Use: Never    Passive Exposure: Never  Financial Resource Strain: Not on file  Food Insecurity: Not on file  Transportation Needs: Not on file  Physical Activity: Not on file  Stress: Not on file  Social Connections: Not on file  Intimate Partner Violence: Not on file  Depression (PHQ2-9): Low Risk (05/04/2024)   Depression (PHQ2-9)    PHQ-2 Score: 0  Alcohol Screen: Not on file  Housing: Not on file  Utilities: Not on file  Health Literacy: Not on file    Family History  Problem Relation Age of Onset   Diabetes Mother    Diabetes Paternal Grandmother    Hypertension Paternal Grandmother     Past Surgical History:  Procedure Laterality Date   abcess of vulva     APPENDECTOMY       ROS: Review of Systems Negative except as stated above  PHYSICAL EXAM: BP 100/70 (BP Location: Left Arm, Patient Position: Sitting, Cuff Size: Normal)   Pulse (!) 103   Temp 98.2 F (36.8 C) (Oral)   Ht 5' 4 (1.626 m)   Wt 202 lb (91.6 kg)   SpO2 96%   BMI 34.67 kg/m   Wt Readings from Last 3 Encounters:  10/20/24 202 lb (91.6 kg)  05/04/24 203 lb (92.1 kg)  03/12/24 195 lb (88.5 kg)    Physical Exam  General appearance - alert, well appearing, and in no distress Mental status - normal mood, behavior, speech, dress, motor activity, and thought processes Nose -severe enlargement of nasal turbinates bilaterally almost blocking nasal passage left greater than right.  No discolored mucus seen in the nostrils. Mouth - mucous membranes moist, pharynx normal without lesions Neck - supple, no significant adenopathy Chest - clear to auscultation, no wheezes, rales or rhonchi, symmetric air entry Heart - normal rate, regular rhythm, normal S1, S2, no murmurs, rubs, clicks or gallops Abdomen: Soft nontender, no organomegaly Musculoskeletal -no swelling or soft tissue edema noted the muscles of the upper arms and thighs. Extremities - peripheral pulses normal, no pedal edema, no clubbing or cyanosis      Latest Ref Rng & Units 01/08/2024    3:13 AM 08/27/2023   11:44 AM 07/22/2022    7:51 PM  CMP  Glucose 70 - 99 mg/dL 893  86  896   BUN 6 - 20 mg/dL 15  7  9    Creatinine 0.44 - 1.00 mg/dL 9.19  9.36  9.28   Sodium 135 - 145 mmol/L 139  139  139   Potassium 3.5 - 5.1 mmol/L 3.8  3.5  3.4   Chloride 98 - 111 mmol/L 103  108  105   CO2 22 - 32 mmol/L 22  23  23    Calcium 8.9 - 10.3 mg/dL 9.6  8.5  9.3   Total Protein 6.5 - 8.1 g/dL 7.8  6.8    Total Bilirubin 0.0 - 1.2 mg/dL 0.7  0.3    Alkaline Phos 38 - 126 U/L 54  63    AST 15 - 41 U/L 19  15    ALT 0 - 44 U/L 19  15     Lipid Panel  No results found for: CHOL, TRIG, HDL, CHOLHDL, VLDL, LDLCALC,  LDLDIRECT  CBC    Component Value Date/Time   WBC 13.8 (H) 01/08/2024 0313   RBC 4.53 01/08/2024 0313   HGB 12.5 01/08/2024 0313   HGB 10.9 (L) 01/15/2022 1107   HCT 36.7 01/08/2024 0313   HCT 32.4 (L) 01/15/2022 1107   PLT 327 01/08/2024 0313   PLT 238 01/15/2022 1107   MCV 81.0 01/08/2024 0313   MCV 76 (L) 01/15/2022 1107   MCH 27.6 01/08/2024 0313   MCHC 34.1 01/08/2024 0313   RDW 13.1 01/08/2024 0313   RDW 14.7 01/15/2022  1107   LYMPHSABS 1.7 09/20/2021 0903   EOSABS 0.1 09/20/2021 0903   BASOSABS 0.0 09/20/2021 0903    ASSESSMENT AND PLAN: 1. Chronic congestion of paranasal sinus (Primary) Persistent nasal congestion and mucus production for four months with temporary relief from recent treatment. Examination shows congested nasal passages. - Discontinued Afrin nasal spray to prevent rebound congestion. - Continue Flonase nasal spray, one spray in each nostril daily. - Prescribed Claritin  D, one tablet daily as needed. - Referred to ENT specialist for further evaluation. - loratadine -pseudoephedrine (CLARITIN -D 24 HOUR) 10-240 MG 24 hr tablet; Take 1 tablet by mouth daily as needed for allergies.  Dispense: 30 tablet; Refill: 0 - Ambulatory referral to ENT  2. Swelling of extremity Exam unrevealing at this time.  She may have intermittent fluid retention.  Advised to limit salt in the foods.  3. Need for influenza vaccination - Flu vaccine trivalent PF, 6mos and older(Flulaval,Afluria,Fluarix,Fluzone)   Patient was given the opportunity to ask questions.  Patient verbalized understanding of the plan and was able to repeat key elements of the plan.   This documentation was completed using Paediatric nurse.  Any transcriptional errors are unintentional.  No orders of the defined types were placed in this encounter.    Requested Prescriptions    No prescriptions requested or ordered in this encounter    No follow-ups on file.  Barnie Louder, MD, FACP     [1]  Current Outpatient Medications on File Prior to Visit  Medication Sig Dispense Refill   drospirenone -ethinyl estradiol  (YAZ) 3-0.02 MG tablet Take 1 tablet by mouth daily. 28 tablet 11   No current facility-administered medications on file prior to visit.  [2]  Allergies Allergen Reactions   Bovine (Beef) Protein-Containing Drug Products     unknown   Chicken Protein     unknown   Fish Protein-Containing Drug Products     unknown   Porcine (Pork) Protein-Containing Drug Products     unknown

## 2024-10-20 NOTE — Patient Instructions (Signed)
°  VISIT SUMMARY: During your visit, we discussed your ongoing nasal congestion and intermittent swelling in your arms, legs, and abdomen. We reviewed your symptoms, previous treatments, and made adjustments to your current treatment plan. Additionally, we talked about general health maintenance, including the importance of getting a flu vaccination.  YOUR PLAN: -CHRONIC SINUSITIS: Chronic sinusitis is a condition where the nasal passages become inflamed and swollen for an extended period, leading to congestion and mucus production. We have discontinued the Afrin nasal spray to avoid rebound congestion and recommend continuing with Flonase nasal spray, one spray in each nostril daily. You are also prescribed Claritin  D, one tablet daily as needed. A referral to an ENT specialist has been made for further evaluation.  -RECURRENT SOFT TISSUE SWELLING: Recurrent soft tissue swelling involves intermittent swelling in areas like the arms, legs, and abdomen, which resolves on its own after a few days. Since there are no associated joint swelling or systemic symptoms, we will continue to monitor this condition.  -GENERAL HEALTH MAINTENANCE: We discussed the importance of getting a flu vaccination to protect against the influenza virus. Please consider getting vaccinated if you haven't already.  INSTRUCTIONS: Please follow up with the ENT specialist as referred for further evaluation of your chronic sinusitis. Continue using Flonase nasal spray daily and take Claritin  D or Allegra D as needed. Monitor your swelling and report any changes or new symptoms. Consider getting a flu vaccination if you haven't already.                      Contains text generated by [employer]bridge.                                 Contains text generated by Abridge.

## 2024-10-22 ENCOUNTER — Encounter (INDEPENDENT_AMBULATORY_CARE_PROVIDER_SITE_OTHER): Payer: Self-pay

## 2024-12-01 ENCOUNTER — Institutional Professional Consult (permissible substitution) (INDEPENDENT_AMBULATORY_CARE_PROVIDER_SITE_OTHER): Admitting: Otolaryngology

## 2024-12-03 ENCOUNTER — Ambulatory Visit (INDEPENDENT_AMBULATORY_CARE_PROVIDER_SITE_OTHER): Admitting: Otolaryngology

## 2024-12-03 ENCOUNTER — Encounter (INDEPENDENT_AMBULATORY_CARE_PROVIDER_SITE_OTHER): Payer: Self-pay | Admitting: Otolaryngology

## 2024-12-03 VITALS — BP 107/77 | HR 81 | Temp 98.0°F | Ht 64.0 in

## 2024-12-03 DIAGNOSIS — J329 Chronic sinusitis, unspecified: Secondary | ICD-10-CM | POA: Diagnosis not present

## 2024-12-03 MED ORDER — AMOXICILLIN-POT CLAVULANATE 875-125 MG PO TABS: 1.0000 | ORAL_TABLET | Freq: Two times a day (BID) | ORAL | 0 refills | Status: AC

## 2024-12-03 NOTE — Progress Notes (Signed)
 Reason for Consult: Sinusitis Referring Physician: Dr. Vicci Collie [name] is an 34 y.o. female.  HPI: History of a couple of months of congestion and drainage from the nose.  She was treated with amoxicillin  for suspected sinusitis.  She got a little better but now is more congested than she was previous.  She has tried Afrin for 3 days and now she is on Flonase.  The Flonase has helped some.  She still has a yellow to brown-colored drainage particularly in the morning.  She has not had this issue previously.  She has no facial pressure or pain.  She has no dysphagia or odynophagia.  Past Medical History:  Diagnosis Date   History of stillbirth 08/25/2018   @ 8 months, due to preeclampsia   Lichen sclerosus 02/06/2017   Preeclampsia    first pregnancy    Past Surgical History:  Procedure Laterality Date   abcess of vulva     APPENDECTOMY      Family History  Problem Relation Age of Onset   Diabetes Mother    Diabetes Paternal Grandmother    Hypertension Paternal Grandmother     Social History:  reports that she has never smoked. She has never used smokeless tobacco. She reports that she does not drink alcohol and does not use drugs.  Allergies: Allergies[1]   No results found for this or any previous visit (from the past 48 hours).  No results found.  ROS Blood pressure 107/77, pulse 81, temperature 98 F (36.7 C), height 5' 4 (1.626 m), SpO2 98%, currently breastfeeding. Physical Exam Constitutional:      Appearance: Normal appearance.  HENT:     Head: Normocephalic and atraumatic.     Right Ear: Tympanic membrane is without lesions and middle ear aerated, ear canal and external ear normal.     Left Ear: Tympanic membrane is without lesions and middle ear aerated, ear canal and external ear normal.     Nose: Nose with the septum is deviated to the left anteriorly.  The turbinates are both very congested.  No masses or polyps. .     Oral  cavity/oropharynx: Mucous membranes are moist. No lesions or masses    Larynx: normal voice. Mirror attempted without success    Eyes:     Extraocular Movements: Extraocular movements intact.     Conjunctiva/sclera: Conjunctivae normal.     Pupils: Pupils are equal, round, and reactive to light.  Cardiovascular:     Rate and Rhythm: Normal rate.  Pulmonary:     Effort: Pulmonary effort is normal.  Musculoskeletal:     Cervical back: Normal range of motion and neck supple. No rigidity.  Lymphadenopathy:     Cervical: No cervical adenopathy or masses.salivary glands without lesions. .     Salivary glands- no mass or swelling Neurological:     Mental Status: He is alert. CN 2-12 intact. No nystagmus      Assessment/Plan: Sinusitis-she has had this for multiple months and now needs another round of antibiotics for broader coverage.  The Flonase probably is not working as well as it could because of the infection.  I started her on Augmentin .  She will use saline irrigation.  She will continue the Flonase.  She will follow-up in 1 month if not improved and sooner if worse.  All of this visit was handled with the use of the interpreter.  Jacqueline Irwin Notice 12/03/2024, 9:52 AM        [1]  Allergies Allergen Reactions   Bovine (Beef) Protein-Containing Drug Products     unknown   Chicken Protein     unknown   Fish Protein-Containing Drug Products     unknown   Porcine (Pork) Protein-Containing Drug Products     unknown

## 2025-01-05 ENCOUNTER — Ambulatory Visit (INDEPENDENT_AMBULATORY_CARE_PROVIDER_SITE_OTHER): Admitting: Otolaryngology
